# Patient Record
Sex: Male | Born: 1960
Health system: Southern US, Community
[De-identification: ages and names within clinical notes are randomized; demographics above are authoritative.]

## PROBLEM LIST (undated history)

## (undated) DIAGNOSIS — F250 Schizoaffective disorder, bipolar type: Secondary | ICD-10-CM

## (undated) DIAGNOSIS — K219 Gastro-esophageal reflux disease without esophagitis: Secondary | ICD-10-CM

## (undated) DIAGNOSIS — E785 Hyperlipidemia, unspecified: Secondary | ICD-10-CM

## (undated) DIAGNOSIS — F259 Schizoaffective disorder, unspecified: Secondary | ICD-10-CM

## (undated) DIAGNOSIS — E559 Vitamin D deficiency, unspecified: Secondary | ICD-10-CM

## (undated) DIAGNOSIS — R809 Proteinuria, unspecified: Secondary | ICD-10-CM

## (undated) DIAGNOSIS — K5909 Other constipation: Secondary | ICD-10-CM

## (undated) DIAGNOSIS — N200 Calculus of kidney: Secondary | ICD-10-CM

## (undated) HISTORY — DX: Hyperlipidemia, unspecified: E78.5

## (undated) HISTORY — DX: Other constipation: K59.09

## (undated) HISTORY — DX: Vitamin D deficiency, unspecified: E55.9

## (undated) HISTORY — DX: Gastro-esophageal reflux disease without esophagitis: K21.9

## (undated) HISTORY — DX: Schizoaffective disorder, unspecified: F25.9

## (undated) HISTORY — DX: Proteinuria, unspecified: R80.9

## (undated) HISTORY — DX: Schizoaffective disorder, bipolar type: F25.0

## (undated) HISTORY — DX: Calculus of kidney: N20.0

---

## 2007-06-21 ENCOUNTER — Ambulatory Visit: Payer: Self-pay | Admitting: Specialist

## 2008-08-05 ENCOUNTER — Ambulatory Visit: Payer: Self-pay | Admitting: Family Medicine

## 2012-07-02 ENCOUNTER — Ambulatory Visit: Payer: Self-pay | Admitting: Gastroenterology

## 2012-07-02 HISTORY — PX: COLONOSCOPY: SHX174

## 2012-07-02 LAB — HM COLONOSCOPY: HM COLON: NORMAL

## 2013-06-26 LAB — LIPID PANEL
Cholesterol: 194 mg/dL (ref 0–200)
HDL: 46 mg/dL (ref 35–70)
LDL CALC: 110 mg/dL
Triglycerides: 192 mg/dL — AB (ref 40–160)

## 2013-06-26 LAB — PSA: PSA: 1.1

## 2014-01-15 DIAGNOSIS — Z79891 Long term (current) use of opiate analgesic: Secondary | ICD-10-CM | POA: Diagnosis not present

## 2014-01-29 DIAGNOSIS — Z79891 Long term (current) use of opiate analgesic: Secondary | ICD-10-CM | POA: Diagnosis not present

## 2014-01-29 DIAGNOSIS — F209 Schizophrenia, unspecified: Secondary | ICD-10-CM | POA: Diagnosis not present

## 2014-02-13 DIAGNOSIS — Z79891 Long term (current) use of opiate analgesic: Secondary | ICD-10-CM | POA: Diagnosis not present

## 2014-03-12 DIAGNOSIS — Z79891 Long term (current) use of opiate analgesic: Secondary | ICD-10-CM | POA: Diagnosis not present

## 2014-03-20 DIAGNOSIS — E785 Hyperlipidemia, unspecified: Secondary | ICD-10-CM | POA: Diagnosis not present

## 2014-03-20 DIAGNOSIS — K219 Gastro-esophageal reflux disease without esophagitis: Secondary | ICD-10-CM | POA: Diagnosis not present

## 2014-03-20 DIAGNOSIS — K5900 Constipation, unspecified: Secondary | ICD-10-CM | POA: Diagnosis not present

## 2014-03-20 DIAGNOSIS — F250 Schizoaffective disorder, bipolar type: Secondary | ICD-10-CM | POA: Diagnosis not present

## 2014-03-20 DIAGNOSIS — R809 Proteinuria, unspecified: Secondary | ICD-10-CM | POA: Diagnosis not present

## 2014-04-10 DIAGNOSIS — Z79891 Long term (current) use of opiate analgesic: Secondary | ICD-10-CM | POA: Diagnosis not present

## 2014-04-24 DIAGNOSIS — Z79891 Long term (current) use of opiate analgesic: Secondary | ICD-10-CM | POA: Diagnosis not present

## 2014-05-08 DIAGNOSIS — Z79891 Long term (current) use of opiate analgesic: Secondary | ICD-10-CM | POA: Diagnosis not present

## 2014-06-05 DIAGNOSIS — Z79891 Long term (current) use of opiate analgesic: Secondary | ICD-10-CM | POA: Diagnosis not present

## 2014-06-10 DIAGNOSIS — Z1389 Encounter for screening for other disorder: Secondary | ICD-10-CM | POA: Diagnosis not present

## 2014-06-10 DIAGNOSIS — R739 Hyperglycemia, unspecified: Secondary | ICD-10-CM | POA: Diagnosis not present

## 2014-06-10 DIAGNOSIS — Z0000 Encounter for general adult medical examination without abnormal findings: Secondary | ICD-10-CM | POA: Diagnosis not present

## 2014-06-10 DIAGNOSIS — R809 Proteinuria, unspecified: Secondary | ICD-10-CM | POA: Diagnosis not present

## 2014-06-10 DIAGNOSIS — E785 Hyperlipidemia, unspecified: Secondary | ICD-10-CM | POA: Diagnosis not present

## 2014-06-26 DIAGNOSIS — Z79891 Long term (current) use of opiate analgesic: Secondary | ICD-10-CM | POA: Diagnosis not present

## 2014-07-08 ENCOUNTER — Other Ambulatory Visit: Payer: Self-pay | Admitting: Family Medicine

## 2014-07-08 NOTE — Telephone Encounter (Signed)
Patient requesting refill. 

## 2014-07-10 DIAGNOSIS — E785 Hyperlipidemia, unspecified: Secondary | ICD-10-CM | POA: Diagnosis not present

## 2014-07-10 DIAGNOSIS — Z79899 Other long term (current) drug therapy: Secondary | ICD-10-CM | POA: Diagnosis not present

## 2014-07-10 DIAGNOSIS — Z79891 Long term (current) use of opiate analgesic: Secondary | ICD-10-CM | POA: Diagnosis not present

## 2014-07-10 DIAGNOSIS — R739 Hyperglycemia, unspecified: Secondary | ICD-10-CM | POA: Diagnosis not present

## 2014-07-10 DIAGNOSIS — Z125 Encounter for screening for malignant neoplasm of prostate: Secondary | ICD-10-CM | POA: Diagnosis not present

## 2014-07-22 ENCOUNTER — Encounter: Payer: Self-pay | Admitting: Family Medicine

## 2014-07-24 DIAGNOSIS — Z79891 Long term (current) use of opiate analgesic: Secondary | ICD-10-CM | POA: Diagnosis not present

## 2014-08-05 ENCOUNTER — Other Ambulatory Visit: Payer: Self-pay | Admitting: Family Medicine

## 2014-08-05 NOTE — Telephone Encounter (Signed)
Patient requesting refill. 

## 2014-08-14 DIAGNOSIS — R739 Hyperglycemia, unspecified: Secondary | ICD-10-CM | POA: Diagnosis not present

## 2014-08-14 DIAGNOSIS — Z125 Encounter for screening for malignant neoplasm of prostate: Secondary | ICD-10-CM | POA: Diagnosis not present

## 2014-08-14 DIAGNOSIS — Z79899 Other long term (current) drug therapy: Secondary | ICD-10-CM | POA: Diagnosis not present

## 2014-08-14 DIAGNOSIS — Z79891 Long term (current) use of opiate analgesic: Secondary | ICD-10-CM | POA: Diagnosis not present

## 2014-08-14 DIAGNOSIS — E785 Hyperlipidemia, unspecified: Secondary | ICD-10-CM | POA: Diagnosis not present

## 2014-08-25 ENCOUNTER — Encounter: Payer: Self-pay | Admitting: Family Medicine

## 2014-09-11 DIAGNOSIS — Z79891 Long term (current) use of opiate analgesic: Secondary | ICD-10-CM | POA: Diagnosis not present

## 2014-10-10 ENCOUNTER — Encounter: Payer: Self-pay | Admitting: Family Medicine

## 2014-10-10 ENCOUNTER — Ambulatory Visit (INDEPENDENT_AMBULATORY_CARE_PROVIDER_SITE_OTHER): Payer: Medicare Other | Admitting: Family Medicine

## 2014-10-10 VITALS — BP 116/72 | HR 94 | Temp 97.8°F | Resp 18 | Ht 77.0 in | Wt 176.3 lb

## 2014-10-10 DIAGNOSIS — Z79899 Other long term (current) drug therapy: Secondary | ICD-10-CM

## 2014-10-10 DIAGNOSIS — Z720 Tobacco use: Secondary | ICD-10-CM | POA: Insufficient documentation

## 2014-10-10 DIAGNOSIS — K5900 Constipation, unspecified: Secondary | ICD-10-CM | POA: Diagnosis not present

## 2014-10-10 DIAGNOSIS — F258 Other schizoaffective disorders: Secondary | ICD-10-CM | POA: Insufficient documentation

## 2014-10-10 DIAGNOSIS — R739 Hyperglycemia, unspecified: Secondary | ICD-10-CM | POA: Diagnosis not present

## 2014-10-10 DIAGNOSIS — K219 Gastro-esophageal reflux disease without esophagitis: Secondary | ICD-10-CM

## 2014-10-10 DIAGNOSIS — Z87442 Personal history of urinary calculi: Secondary | ICD-10-CM | POA: Insufficient documentation

## 2014-10-10 DIAGNOSIS — K644 Residual hemorrhoidal skin tags: Secondary | ICD-10-CM | POA: Insufficient documentation

## 2014-10-10 DIAGNOSIS — Z23 Encounter for immunization: Secondary | ICD-10-CM | POA: Diagnosis not present

## 2014-10-10 DIAGNOSIS — E785 Hyperlipidemia, unspecified: Secondary | ICD-10-CM | POA: Insufficient documentation

## 2014-10-10 DIAGNOSIS — K5909 Other constipation: Secondary | ICD-10-CM | POA: Insufficient documentation

## 2014-10-10 DIAGNOSIS — R809 Proteinuria, unspecified: Secondary | ICD-10-CM | POA: Insufficient documentation

## 2014-10-10 NOTE — Progress Notes (Signed)
Name: Christopher Valdez.   MRN: [id number]    DOB: June 27, 1960   Date:10/10/2014       Progress Note  Subjective  Chief Complaint  Chief Complaint  Patient presents with  . Medication Refill    follow-up  . Depression  . Hyperlipidemia  . Gastrophageal Reflux    HPI  Dyslipidemia: he takes Simvastatin daily and denies side effects.    GERD: he has been taking Omeprazole, denies heartburn or regurgitation.   Schizoaffective Disorder: he lives in a group home, has been stable on medications. Able to drive and work at [employer]   Constipation: he has a bowel movement almost every day, taking Fibercon with good control of symptoms. No abdominal pain, no recent pain during bowel movements.   Hyperglycemia: gained 11 lbs since last visit, not following a low sugar diet, denies polyphagia, polyuria or polydipsia.   Proteinuria: back to normal when checked in March 2016   Patient Active Problem List   Diagnosis Date Noted  . Other schizoaffective disorders 10/10/2014  . Proteinuria 10/10/2014  . Hyperglycemia 10/10/2014  . Dyslipidemia 10/10/2014  . History of kidney stones 10/10/2014  . External hemorrhoid 10/10/2014  . Chronic constipation 10/10/2014  . Tobacco use 10/10/2014  . GERD without esophagitis 10/10/2014    No past surgical history on file.  Family History  Problem Relation Age of Onset  . Parkinson's disease Mother   . Deep vein thrombosis Father   . Osteoarthritis Father     Social History   Social History  . Marital Status: Single    Spouse Name: N/A  . Number of Children: N/A  . Years of Education: N/A   Occupational History  . Not on file.   Social History Main Topics  . Smoking status: Current Every Day Smoker -- 20.00 packs/day for 40 years    Types: Cigarettes  . Smokeless tobacco: Never Used  . Alcohol Use: No  . Drug Use: No  . Sexual Activity: No   Other Topics Concern  . Not on file   Social History Narrative      Current outpatient prescriptions:  .  cholecalciferol (VITAMIN D) 1000 UNITS tablet, Take 1,000 Units by mouth 2 (two) times daily., Disp: , Rfl:  .  cloZAPine (CLOZARIL) 100 MG tablet, Take 100 mg by mouth daily., Disp: , Rfl:  .  cloZAPine (CLOZARIL) 100 MG tablet, Take 100 mg by mouth daily., Disp: , Rfl:  .  divalproex (DEPAKOTE) 500 MG DR tablet, Take 500 mg by mouth 2 (two) times daily., Disp: , Rfl:  .  FLUoxetine (PROZAC) 20 MG capsule, Take 20 mg by mouth daily., Disp: , Rfl:  .  FLUoxetine (PROZAC) 40 MG capsule, Take 40 mg by mouth daily., Disp: , Rfl:  .  omeprazole (PRILOSEC) 40 MG capsule, TAKE ONE CAPSULE BY MOUTH EVERY MORNING FOR REFLUX, Disp: 30 capsule, Rfl: 5 .  polycarbophil (FIBERCON) 625 MG tablet, Take 625 mg by mouth daily., Disp: , Rfl:  .  simvastatin (ZOCOR) 10 MG tablet, TAKE ONE TABLET EVERY EVENING FOR CHOLESTEROL, Disp: 30 tablet, Rfl: 12  No Known Allergies   ROS  Constitutional: Negative for fever or weight change.  Respiratory: Negative for cough and shortness of breath.   Cardiovascular: Negative for chest pain or palpitations.  Gastrointestinal: Negative for abdominal pain, no bowel changes.  Musculoskeletal: Negative for gait problem or joint swelling.  Skin: Negative for rash.  Neurological: Negative for dizziness or headache.  No other specific complaints in a complete review of systems (except as listed in HPI above).  Objective  Filed Vitals:   10/10/14 1403  BP: 116/72  Pulse: 94  Temp: 97.8 F (36.6 C)  TempSrc: Oral  Resp: 18  Height:  (1.956 m)  Weight: 176 lb 4.8 oz (79.969 kg)  SpO2: 96%    Body mass index is 20.9 kg/(m^2).  Physical Exam  Constitutional: Patient appears well-developed and well-nourished. No distress.  HEENT: head atraumatic, normocephalic, pupils equal and reactive to light,  neck supple, throat within normal limits Cardiovascular: Normal rate, regular rhythm and normal heart sounds.  No  murmur heard. No BLE edema. Pulmonary/Chest: Effort normal and breath sounds normal. No respiratory distress. Abdominal: Soft.  There is no tenderness. Psychiatric: Patient has a normal mood and affect. behavior is normal. Judgment and thought content normal.  PHQ2/9: Depression screen PHQ 2/9 10/10/2014  Decreased Interest 0  Down, Depressed, Hopeless 1  PHQ - 2 Score 1     Fall Risk: Fall Risk  10/10/2014  Falls in the past year? No     Functional Status Survey: Is the patient deaf or have difficulty hearing?: No Does the patient have difficulty seeing, even when wearing glasses/contacts?: Yes (glasses) Does the patient have difficulty concentrating, remembering, or making decisions?: Yes Does the patient have difficulty walking or climbing stairs?: No Does the patient have difficulty dressing or bathing?: No Does the patient have difficulty doing errands alone such as visiting a doctor's office or shopping?: Yes (does not drive)    Assessment & Plan  1. Dyslipidemia  - Lipid panel  2. Needs flu shot  - Flu Vaccine QUAD 36+ mos PF IM (Fluarix & Fluzone Quad PF)  3. Other schizoaffective disorders  Continue follow up with Psychiatrist  4. Hyperglycemia  - Hemoglobin A1c  5. Chronic constipation  Doing well on medication   6. Tobacco use   not ready to quit  7. GERD without esophagitis  Doing well on Omeprazole  8. Long-term use of high-risk medication  - Comprehensive metabolic panel

## 2014-10-14 DIAGNOSIS — Z79891 Long term (current) use of opiate analgesic: Secondary | ICD-10-CM | POA: Diagnosis not present

## 2014-10-28 DIAGNOSIS — Z79899 Other long term (current) drug therapy: Secondary | ICD-10-CM | POA: Diagnosis not present

## 2014-10-28 DIAGNOSIS — R739 Hyperglycemia, unspecified: Secondary | ICD-10-CM | POA: Diagnosis not present

## 2014-10-28 DIAGNOSIS — E785 Hyperlipidemia, unspecified: Secondary | ICD-10-CM | POA: Diagnosis not present

## 2014-10-28 DIAGNOSIS — H25813 Combined forms of age-related cataract, bilateral: Secondary | ICD-10-CM | POA: Diagnosis not present

## 2014-10-28 DIAGNOSIS — Z79891 Long term (current) use of opiate analgesic: Secondary | ICD-10-CM | POA: Diagnosis not present

## 2014-10-29 ENCOUNTER — Encounter: Payer: Self-pay | Admitting: Family Medicine

## 2014-10-29 DIAGNOSIS — E559 Vitamin D deficiency, unspecified: Secondary | ICD-10-CM | POA: Insufficient documentation

## 2014-10-29 DIAGNOSIS — N400 Enlarged prostate without lower urinary tract symptoms: Secondary | ICD-10-CM | POA: Insufficient documentation

## 2014-10-29 LAB — COMPREHENSIVE METABOLIC PANEL
ALBUMIN: 4.3 g/dL (ref 3.5–5.5)
ALT: 20 IU/L (ref 0–44)
AST: 21 IU/L (ref 0–40)
Albumin/Globulin Ratio: 1.9 (ref 1.1–2.5)
Alkaline Phosphatase: 110 IU/L (ref 39–117)
BUN/Creatinine Ratio: 21 — ABNORMAL HIGH (ref 9–20)
BUN: 18 mg/dL (ref 6–24)
Bilirubin Total: 0.3 mg/dL (ref 0.0–1.2)
CALCIUM: 9.9 mg/dL (ref 8.7–10.2)
CO2: 25 mmol/L (ref 18–29)
CREATININE: 0.87 mg/dL (ref 0.76–1.27)
Chloride: 100 mmol/L (ref 97–106)
GFR calc Af Amer: 112 mL/min/{1.73_m2} (ref >59)
GFR, EST NON AFRICAN AMERICAN: 97 mL/min/{1.73_m2} (ref >59)
GLOBULIN, TOTAL: 2.3 g/dL (ref 1.5–4.5)
Glucose: 104 mg/dL — ABNORMAL HIGH (ref 65–99)
Potassium: 4.6 mmol/L (ref 3.5–5.2)
SODIUM: 141 mmol/L (ref 136–144)
Total Protein: 6.6 g/dL (ref 6.0–8.5)

## 2014-10-29 LAB — LIPID PANEL
CHOLESTEROL TOTAL: 193 mg/dL (ref 100–199)
Chol/HDL Ratio: 4.4 ratio units (ref 0.0–5.0)
HDL: 44 mg/dL (ref >39)
LDL Calculated: 120 mg/dL — ABNORMAL HIGH (ref 0–99)
Triglycerides: 145 mg/dL (ref 0–149)
VLDL Cholesterol Cal: 29 mg/dL (ref 5–40)

## 2014-10-29 LAB — HEMOGLOBIN A1C
ESTIMATED AVERAGE GLUCOSE: 111 mg/dL
Hgb A1c MFr Bld: 5.5 % (ref 4.8–5.6)

## 2014-11-10 ENCOUNTER — Other Ambulatory Visit: Payer: Self-pay

## 2014-11-10 MED ORDER — VITAMIN D 50 MCG (2000 UT) PO CAPS: 2000.0000 | ORAL_CAPSULE | Freq: Once | ORAL | Status: DC

## 2014-11-11 ENCOUNTER — Other Ambulatory Visit: Payer: Self-pay

## 2014-11-11 MED ORDER — LISINOPRIL 10 MG PO TABS: 10.0000 mg | ORAL_TABLET | Freq: Every day | ORAL | Status: DC

## 2014-11-11 NOTE — Telephone Encounter (Signed)
Patient needs refill

## 2014-11-12 DIAGNOSIS — Z79891 Long term (current) use of opiate analgesic: Secondary | ICD-10-CM | POA: Diagnosis not present

## 2014-11-26 DIAGNOSIS — Z79891 Long term (current) use of opiate analgesic: Secondary | ICD-10-CM | POA: Diagnosis not present

## 2014-12-10 DIAGNOSIS — Z79891 Long term (current) use of opiate analgesic: Secondary | ICD-10-CM | POA: Diagnosis not present

## 2014-12-23 DIAGNOSIS — Z79891 Long term (current) use of opiate analgesic: Secondary | ICD-10-CM | POA: Diagnosis not present

## 2015-01-22 DIAGNOSIS — Z79891 Long term (current) use of opiate analgesic: Secondary | ICD-10-CM | POA: Diagnosis not present

## 2015-02-04 DIAGNOSIS — Z79891 Long term (current) use of opiate analgesic: Secondary | ICD-10-CM | POA: Diagnosis not present

## 2015-02-09 ENCOUNTER — Other Ambulatory Visit: Payer: Self-pay

## 2015-02-09 MED ORDER — OMEPRAZOLE 40 MG PO CPDR: DELAYED_RELEASE_CAPSULE | ORAL | Status: DC

## 2015-02-09 NOTE — Telephone Encounter (Signed)
Patient requesting refill. 

## 2015-02-18 DIAGNOSIS — Z79891 Long term (current) use of opiate analgesic: Secondary | ICD-10-CM | POA: Diagnosis not present

## 2015-03-05 DIAGNOSIS — F209 Schizophrenia, unspecified: Secondary | ICD-10-CM | POA: Diagnosis not present

## 2015-04-13 DIAGNOSIS — Z79891 Long term (current) use of opiate analgesic: Secondary | ICD-10-CM | POA: Diagnosis not present

## 2015-04-14 ENCOUNTER — Ambulatory Visit (INDEPENDENT_AMBULATORY_CARE_PROVIDER_SITE_OTHER): Payer: Medicare Other | Admitting: Family Medicine

## 2015-04-14 ENCOUNTER — Encounter: Payer: Self-pay | Admitting: Family Medicine

## 2015-04-14 VITALS — BP 110/70 | HR 96 | Temp 98.4°F | Resp 16 | Ht 77.0 in | Wt 172.5 lb

## 2015-04-14 DIAGNOSIS — K219 Gastro-esophageal reflux disease without esophagitis: Secondary | ICD-10-CM

## 2015-04-14 DIAGNOSIS — E785 Hyperlipidemia, unspecified: Secondary | ICD-10-CM

## 2015-04-14 DIAGNOSIS — F258 Other schizoaffective disorders: Secondary | ICD-10-CM | POA: Diagnosis not present

## 2015-04-14 DIAGNOSIS — K5900 Constipation, unspecified: Secondary | ICD-10-CM

## 2015-04-14 DIAGNOSIS — K5909 Other constipation: Secondary | ICD-10-CM

## 2015-04-14 DIAGNOSIS — R809 Proteinuria, unspecified: Secondary | ICD-10-CM

## 2015-04-14 MED ORDER — LISINOPRIL 10 MG PO TABS: 10.0000 mg | ORAL_TABLET | Freq: Every day | ORAL | Status: DC

## 2015-04-14 NOTE — Progress Notes (Signed)
Name: Christopher Valdez.   MRN: [id number]    DOB: 08/28/1960   Date:04/14/2015       Progress Note  Subjective  Chief Complaint  Chief Complaint  Patient presents with  . Medication Refill  . Dyslipidemia  . Gastroesophageal Reflux    w/o esophagitis  . Other Schizoaffective Disorderd  . Constipation    chronic  . Long Term use of high-risk medication    HPI  Dyslipidemia: he takes Simvastatin daily and denies side effects. Last labs reviewed and at goal, continue medication   GERD: he has been taking Omeprazole, denies heartburn or regurgitation.   Schizoaffective Disorder: he lives in a group home, has been stable on medications. Able to drive and work at [employer] He sees Therapist, sports - goes to [place] - sees Dr. Hart Carwin, also sees a therapist.   Constipation: he has a bowel movement almost every day, taking Fibercon with good control of symptoms. No abdominal pain, no recent pain during bowel movements. No soiling.   Hyperglycemia: he has lost weight since last visit. , not following a low sugar diet, denies polyphagia, polyuria or polydipsia. Last hgbA1C 5.5%  Proteinuria: back to normal when checked in March 2016 we will recheck today. He states that when it is too hot he gets dizzy when he stoops over but able to control it by staying hydrated.    Patient Active Problem List   Diagnosis Date Noted  . Avitaminosis D 10/29/2014  . Enlarged prostate 10/29/2014  . Other schizoaffective disorders (HCC) 10/10/2014  . Proteinuria 10/10/2014  . Hyperglycemia 10/10/2014  . Dyslipidemia 10/10/2014  . History of kidney stones 10/10/2014  . External hemorrhoid 10/10/2014  . Chronic constipation 10/10/2014  . Tobacco use 10/10/2014  . GERD without esophagitis 10/10/2014    No past surgical history on file.  Family History  Problem Relation Age of Onset  . Parkinson's disease Mother   . Deep vein thrombosis Father   . Osteoarthritis Father     Social  History   Social History  . Marital Status: Single    Spouse Name: N/A  . Number of Children: N/A  . Years of Education: N/A   Occupational History  . Not on file.   Social History Main Topics  . Smoking status: Current Every Day Smoker -- 20.00 packs/day for 40 years    Types: Cigarettes  . Smokeless tobacco: Never Used  . Alcohol Use: No  . Drug Use: No  . Sexual Activity: No   Other Topics Concern  . Not on file   Social History Narrative     Current outpatient prescriptions:  .  Cholecalciferol (VITAMIN D) 2000 UNITS CAPS, Take 2,000 capsules (4,000,000 Units total) by mouth once., Disp: 30 capsule, Rfl: 12 .  cloZAPine (CLOZARIL) 100 MG tablet, Take 100 mg by mouth daily., Disp: , Rfl:  .  cloZAPine (CLOZARIL) 100 MG tablet, Take 100 mg by mouth daily., Disp: , Rfl:  .  divalproex (DEPAKOTE) 500 MG DR tablet, Take 500 mg by mouth 2 (two) times daily., Disp: , Rfl:  .  FLUoxetine (PROZAC) 20 MG capsule, Take 20 mg by mouth daily., Disp: , Rfl:  .  FLUoxetine (PROZAC) 40 MG capsule, Take 40 mg by mouth daily., Disp: , Rfl:  .  lisinopril (PRINIVIL,ZESTRIL) 10 MG tablet, Take 1 tablet (10 mg total) by mouth daily., Disp: 90 tablet, Rfl: 1 .  omeprazole (PRILOSEC) 40 MG capsule, TAKE ONE CAPSULE BY MOUTH EVERY MORNING  FOR REFLUX, Disp: 30 capsule, Rfl: 5 .  polycarbophil (FIBERCON) 625 MG tablet, Take 625 mg by mouth daily., Disp: , Rfl:  .  simvastatin (ZOCOR) 10 MG tablet, TAKE ONE TABLET EVERY EVENING FOR CHOLESTEROL, Disp: 30 tablet, Rfl: 12  No Known Allergies   ROS  Constitutional: Negative for fever , positive for mild  weight change.  Respiratory: Negative for cough and shortness of breath.   Cardiovascular: Negative for chest pain or palpitations.  Gastrointestinal: Negative for abdominal pain, no bowel changes.  Musculoskeletal: Negative for gait problem or joint swelling.  Skin: Negative for rash.  Neurological: Negative for dizziness ( not currently ) or  headache.  No other specific complaints in a complete review of systems (except as listed in HPI above).  Objective  Filed Vitals:   04/14/15 1514  BP: 110/70  Pulse: 96  Temp: 98.4 F (36.9 C)  TempSrc: Oral  Resp: 16  Height: 6\' 5"  (1.956 m)  Weight: 172 lb 8 oz (78.245 kg)  SpO2: 98%    Body mass index is 20.45 kg/(m^2).  Physical Exam  Constitutional: Patient appears well-developed and well-nourished.  No distress.  HEENT: head atraumatic, normocephalic, pupils equal and reactive to light, neck supple, throat within normal limits Cardiovascular: Normal rate, regular rhythm and normal heart sounds.  No murmur heard. No BLE edema. Pulmonary/Chest: Effort normal and breath sounds normal. No respiratory distress. Abdominal: Soft.  There is no tenderness. Psychiatric: Patient has a normal mood and affect. behavior is normal. Able to give appropriate information.    PHQ2/9: [facility] 04/14/2015 10/10/2014  Decreased Interest 0 0  Down, Depressed, Hopeless 0 1  PHQ - 2 Score 0 1     Fall Risk: Fall Risk  04/14/2015 10/10/2014  Falls in the past year? No No      Functional Status Survey: Is the patient deaf or have difficulty hearing?: No Does the patient have difficulty seeing, even when wearing glasses/contacts?: No Does the patient have difficulty concentrating, remembering, or making decisions?: No Does the patient have difficulty walking or climbing stairs?: No Does the patient have difficulty dressing or bathing?: No Does the patient have difficulty doing errands alone such as visiting a doctor's office or shopping?: No    Assessment & Plan   1. Proteinuria  - POCT UA - Microalbumin  2. Other schizoaffective disorders (HCC)  Continue follow up with psychiatrist and therapist  3. Dyslipidemia  Continue simvastatin, no side effects  4. GERD without esophagitis  Doing well on Omeprazole  5. Chronic constipation  Doing well at this  time

## 2015-05-13 DIAGNOSIS — Z79891 Long term (current) use of opiate analgesic: Secondary | ICD-10-CM | POA: Diagnosis not present

## 2015-05-13 DIAGNOSIS — F209 Schizophrenia, unspecified: Secondary | ICD-10-CM | POA: Diagnosis not present

## 2015-05-20 ENCOUNTER — Encounter: Payer: Self-pay | Admitting: Family Medicine

## 2015-05-20 ENCOUNTER — Ambulatory Visit (INDEPENDENT_AMBULATORY_CARE_PROVIDER_SITE_OTHER): Payer: Medicare Other | Admitting: Family Medicine

## 2015-05-20 VITALS — BP 102/58 | HR 95 | Temp 97.5°F | Resp 18 | Ht 77.0 in | Wt 163.9 lb

## 2015-05-20 DIAGNOSIS — R197 Diarrhea, unspecified: Secondary | ICD-10-CM | POA: Diagnosis not present

## 2015-05-20 DIAGNOSIS — F19939 Other psychoactive substance use, unspecified with withdrawal, unspecified: Secondary | ICD-10-CM | POA: Diagnosis not present

## 2015-05-20 DIAGNOSIS — R634 Abnormal weight loss: Secondary | ICD-10-CM

## 2015-05-20 NOTE — Progress Notes (Signed)
Name: Christopher Valdez.   MRN: [id number]    DOB: 09-04-1960   Date:05/20/2015       Progress Note  Subjective  Chief Complaint  Chief Complaint  Patient presents with  . Dizziness    patient is concerned about his BP being too high.  . Weight Loss    patient stated that he has not had an appetite  . Diarrhea    excessive. patient stated that he had an accident last night in his pants  . Medication Refill    patient is not sure if he needs a rx or not    HPI  WD of Clozaril: he has been taking Clozaril for many years, and for some reason he ran out of prescription last Tuesday and within 24 hours he started to lose appetite , some nausea, followed by diarrhea and worsening of lack of appetite. He denies fever, abdominal pain or blood in stools. He resumed medication Friday night. His symptoms have gradually improved since. Last night ate dinner without any problems, no longer has nausea, appetite is good. No diarrhea. Dizziness initially but feels much better today. While without the medication he was feeling a little paranoid without the medication   Patient Active Problem List   Diagnosis Date Noted  . Avitaminosis D 10/29/2014  . Enlarged prostate 10/29/2014  . Other schizoaffective disorders (HCC) 10/10/2014  . Proteinuria 10/10/2014  . Hyperglycemia 10/10/2014  . Dyslipidemia 10/10/2014  . History of kidney stones 10/10/2014  . External hemorrhoid 10/10/2014  . Chronic constipation 10/10/2014  . Tobacco use 10/10/2014  . GERD without esophagitis 10/10/2014    History reviewed. No pertinent past surgical history.  Family History  Problem Relation Age of Onset  . Parkinson's disease Mother   . Deep vein thrombosis Father   . Osteoarthritis Father     Social History   Social History  . Marital Status: Single    Spouse Name: N/A  . Number of Children: N/A  . Years of Education: N/A   Occupational History  . Not on file.   Social History Main Topics  . Smoking  status: Current Every Day Smoker -- 20.00 packs/day for 40 years    Types: Cigarettes  . Smokeless tobacco: Never Used  . Alcohol Use: No  . Drug Use: No  . Sexual Activity: No   Other Topics Concern  . Not on file   Social History Narrative     Current outpatient prescriptions:  .  Cholecalciferol (VITAMIN D) 2000 UNITS CAPS, Take 2,000 capsules (4,000,000 Units total) by mouth once., Disp: 30 capsule, Rfl: 12 .  cloZAPine (CLOZARIL) 100 MG tablet, Take 100 mg by mouth daily., Disp: , Rfl:  .  cloZAPine (CLOZARIL) 100 MG tablet, Take 100 mg by mouth daily., Disp: , Rfl:  .  divalproex (DEPAKOTE) 500 MG DR tablet, Take 500 mg by mouth 2 (two) times daily., Disp: , Rfl:  .  FLUoxetine (PROZAC) 20 MG capsule, Take 20 mg by mouth daily., Disp: , Rfl:  .  FLUoxetine (PROZAC) 40 MG capsule, Take 40 mg by mouth daily., Disp: , Rfl:  .  lisinopril (PRINIVIL,ZESTRIL) 10 MG tablet, Take 1 tablet (10 mg total) by mouth daily., Disp: 90 tablet, Rfl: 1 .  omeprazole (PRILOSEC) 40 MG capsule, TAKE ONE CAPSULE BY MOUTH EVERY MORNING FOR REFLUX, Disp: 30 capsule, Rfl: 5 .  polycarbophil (FIBERCON) 625 MG tablet, Take 625 mg by mouth daily., Disp: , Rfl:  .  simvastatin (ZOCOR) 10  MG tablet, TAKE ONE TABLET EVERY EVENING FOR CHOLESTEROL, Disp: 30 tablet, Rfl: 12  No Known Allergies   ROS  Ten systems reviewed and is negative except as mentioned in HPI   Objective  Filed Vitals:   05/20/15 1118  BP: 102/58  Pulse: 95  Temp: 97.5 F (36.4 C)  TempSrc: Oral  Resp: 18  Height: 6\' 5"  (1.956 m)  Weight: 163 lb 14.4 oz (74.345 kg)  SpO2: 96%    Body mass index is 19.43 kg/(m^2).  Physical Exam  Constitutional: Patient appears well-developed and well-nourished. No distress.  HEENT: head atraumatic, normocephalic, pupils equal and reactive to light, neck supple, throat within normal limits Cardiovascular: Normal rate, regular rhythm and normal heart sounds.  No murmur heard. No BLE  edema. Pulmonary/Chest: Effort normal and breath sounds normal. No respiratory distress. Abdominal: Soft.  There is no tenderness. Psychiatric: Patient has a normal mood and affect. behavior is normal. Judgment and thought content normal.   PHQ2/9: [facility] 05/20/2015 04/14/2015 10/10/2014  Decreased Interest 0 0 0  Down, Depressed, Hopeless 1 0 1  PHQ - 2 Score 1 0 1    Fall Risk: Fall Risk  05/20/2015 04/14/2015 10/10/2014  Falls in the past year? No No No    Functional Status Survey: Is the patient deaf or have difficulty hearing?: No Does the patient have difficulty seeing, even when wearing glasses/contacts?: No Does the patient have difficulty concentrating, remembering, or making decisions?: No Does the patient have difficulty walking or climbing stairs?: No Does the patient have difficulty dressing or bathing?: No Does the patient have difficulty doing errands alone such as visiting a doctor's office or shopping?: No    Assessment & Plan  1. Diarrhea, unspecified type  resolved  2. Weight loss  Appetite is back to normal, advised to monitor and if no weight gain return sooner for follow up  3. Withdrawal from other psychoactive substance (HCC)  All symptoms improved and mostly resolved since resumed Clozaril, explained importance of not running out of medication to Alphonsa OverallBevonda Ford - caregiver from group home to avoid recurrence of symptoms

## 2015-05-22 ENCOUNTER — Ambulatory Visit: Payer: Medicare Other | Admitting: Family Medicine

## 2015-05-27 DIAGNOSIS — F209 Schizophrenia, unspecified: Secondary | ICD-10-CM | POA: Diagnosis not present

## 2015-06-16 DIAGNOSIS — F209 Schizophrenia, unspecified: Secondary | ICD-10-CM | POA: Diagnosis not present

## 2015-07-13 DIAGNOSIS — F209 Schizophrenia, unspecified: Secondary | ICD-10-CM | POA: Diagnosis not present

## 2015-08-12 DIAGNOSIS — F209 Schizophrenia, unspecified: Secondary | ICD-10-CM | POA: Diagnosis not present

## 2015-09-07 ENCOUNTER — Other Ambulatory Visit: Payer: Self-pay

## 2015-09-07 MED ORDER — OMEPRAZOLE 40 MG PO CPDR: DELAYED_RELEASE_CAPSULE | ORAL | 5 refills | Status: DC

## 2015-09-07 NOTE — Telephone Encounter (Signed)
Patient requesting refill of O[facility]perazole [facility] into [facility].

## 2015-10-14 ENCOUNTER — Ambulatory Visit (INDEPENDENT_AMBULATORY_CARE_PROVIDER_SITE_OTHER): Payer: Medicare Other | Admitting: Family Medicine

## 2015-10-14 ENCOUNTER — Encounter: Payer: Self-pay | Admitting: Family Medicine

## 2015-10-14 ENCOUNTER — Ambulatory Visit: Payer: Medicare Other | Admitting: Family Medicine

## 2015-10-14 VITALS — BP 112/68 | HR 94 | Temp 97.8°F | Resp 16 | Ht 77.0 in | Wt 171.2 lb

## 2015-10-14 DIAGNOSIS — Z23 Encounter for immunization: Secondary | ICD-10-CM | POA: Diagnosis not present

## 2015-10-14 DIAGNOSIS — F258 Other schizoaffective disorders: Secondary | ICD-10-CM

## 2015-10-14 DIAGNOSIS — R059 Cough, unspecified: Secondary | ICD-10-CM

## 2015-10-14 DIAGNOSIS — R05 Cough: Secondary | ICD-10-CM

## 2015-10-14 DIAGNOSIS — K219 Gastro-esophageal reflux disease without esophagitis: Secondary | ICD-10-CM | POA: Diagnosis not present

## 2015-10-14 DIAGNOSIS — K5909 Other constipation: Secondary | ICD-10-CM

## 2015-10-14 DIAGNOSIS — R739 Hyperglycemia, unspecified: Secondary | ICD-10-CM | POA: Diagnosis not present

## 2015-10-14 DIAGNOSIS — Z720 Tobacco use: Secondary | ICD-10-CM | POA: Diagnosis not present

## 2015-10-14 DIAGNOSIS — Z79899 Other long term (current) drug therapy: Secondary | ICD-10-CM | POA: Diagnosis not present

## 2015-10-14 DIAGNOSIS — E785 Hyperlipidemia, unspecified: Secondary | ICD-10-CM | POA: Diagnosis not present

## 2015-10-14 DIAGNOSIS — R808 Other proteinuria: Secondary | ICD-10-CM

## 2015-10-14 NOTE — Progress Notes (Signed)
Name: Christopher Valdez.   MRN: [id number]    DOB: [birth year]   Date:10/14/2015       Progress Note  Subjective  Chief Complaint  Chief Complaint  Patient presents with  . Medication Refill    pt here for 6 month follow up    HPI  Dyslipidemia: he takes Simvastatin daily and denies side effects. He is due for labs  GERD: he has been taking Omeprazole, denies heartburn or regurgitation.   Schizoaffective Disorder: he lives in a group home, has been stable on medications. Able to drive and work at [employer], he is upset that he has not been called in to work over the past few weeks. He sees Therapist, sports - goes to [place] - sees Dr. Hart Carwin, also sees a therapist.   Constipation: he has a bowel movement almost every day, taking Fibercon with good control of symptoms. No abdominal pain, no recent pain during bowel movements. No soiling.   Hyperglycemia: he has gained weight, not following a low sugar diet, denies polyphagia, polyuria or polydipsia. Last hgbA1C 5.5%  Proteinuria: back to normal when checked in March 2016 we will recheck today. He states that when it is too hot he gets dizzy and has been off lisinopril  Cough: she states that he has a morning cough, but not daily , no SOB, he states cough is dry.    Patient Active Problem List   Diagnosis Date Noted  . Avitaminosis D 10/29/2014  . Enlarged prostate 10/29/2014  . Other schizoaffective disorders (HCC) 10/10/2014  . Proteinuria 10/10/2014  . Hyperglycemia 10/10/2014  . Dyslipidemia 10/10/2014  . History of kidney stones 10/10/2014  . External hemorrhoid 10/10/2014  . Chronic constipation 10/10/2014  . Tobacco use 10/10/2014  . GERD without esophagitis 10/10/2014    History reviewed. No pertinent surgical history.  Family History  Problem Relation Age of Onset  . Parkinson's disease Mother   . Deep vein thrombosis Father   . Osteoarthritis Father     Social History   Social History  .  Marital status: Single    Spouse name: N/A  . Number of children: N/A  . Years of education: N/A   Occupational History  . Not on file.   Social History Main Topics  . Smoking status: Current Every Day Smoker    Packs/day: 20.00    Years: 40.00    Types: Cigarettes  . Smokeless tobacco: Never Used  . Alcohol use No  . Drug use: No  . Sexual activity: No   Other Topics Concern  . Not on file   Social History Narrative  . No narrative on file     Current Outpatient Prescriptions:  .  Cholecalciferol (VITAMIN D) 2000 UNITS CAPS, Take 2,000 capsules (4,000,000 Units total) by mouth once., Disp: 30 capsule, Rfl: 12 .  cloZAPine (CLOZARIL) 100 MG tablet, Take 100 mg by mouth daily., Disp: , Rfl:  .  cloZAPine (CLOZARIL) 100 MG tablet, Take 100 mg by mouth daily., Disp: , Rfl:  .  divalproex (DEPAKOTE) 500 MG DR tablet, Take 500 mg by mouth 2 (two) times daily., Disp: , Rfl:  .  FLUoxetine (PROZAC) 20 MG capsule, Take 20 mg by mouth daily., Disp: , Rfl:  .  FLUoxetine (PROZAC) 40 MG capsule, Take 40 mg by mouth daily., Disp: , Rfl:  .  omeprazole (PRILOSEC) 40 MG capsule, TAKE ONE CAPSULE BY MOUTH EVERY MORNING FOR REFLUX, Disp: 30 capsule, Rfl: 5 .  polycarbophil (  FIBERCON) 625 MG tablet, Take 625 mg by mouth daily., Disp: , Rfl:  .  simvastatin (ZOCOR) 10 MG tablet, TAKE ONE TABLET EVERY EVENING FOR CHOLESTEROL, Disp: 30 tablet, Rfl: 12  No Known Allergies   ROS  Constitutional: Negative for fever, positive for  weight change.  Respiratory: Positive for occasional cough in am's, but denies shortness of breath.   Cardiovascular: Negative for chest pain or palpitations.  Gastrointestinal: Negative for abdominal pain, no bowel changes.  Musculoskeletal: Negative for gait problem or joint swelling.  Skin: Negative for rash.  Neurological: Negative for dizziness or headache.  No other specific complaints in a complete review of systems (except as listed in HPI  above).  Objective  Vitals:   10/14/15 1025  BP: 112/68  Pulse: 94  Resp: 16  Temp: 97.8 F (36.6 C)  SpO2: 96%  Weight: 171 lb 3 oz (77.7 kg)  Height: 6\' 5"  (1.956 m)    Body mass index is 20.3 kg/m.  Physical Exam  Constitutional: Patient appears well-developed and well-nourished.  No distress.  HEENT: head atraumatic, normocephalic, pupils equal and reactive to light, neck supple, throat within normal limits Cardiovascular: Normal rate, regular rhythm and normal heart sounds.  No murmur heard. No BLE edema. Pulmonary/Chest: Effort normal and breath sounds normal. No respiratory distress. Abdominal: Soft.  There is no tenderness. Psychiatric: Patient has a normal mood and affect. behavior is normal. Able to give appropriate information.    PHQ2/9: [facility] screen [facility] 10/14/2015 05/20/2015 04/14/2015 10/10/2014  Decreased Interest 0 0 0 0  Down, Depressed, Hopeless 0 1 0 1  PHQ - 2 Score 0 1 0 1     Fall Risk: Fall Risk  10/14/2015 05/20/2015 04/14/2015 10/10/2014  Falls in the past year? No No No No     Functional Status Survey: Is the patient deaf or have difficulty hearing?: No Does the patient have difficulty seeing, even when wearing glasses/contacts?: No Does the patient have difficulty concentrating, remembering, or making decisions?: No Does the patient have difficulty walking or climbing stairs?: No Does the patient have difficulty dressing or bathing?: No Does the patient have difficulty doing errands alone such as visiting a doctor's office or shopping?: Yes    Assessment & Plan  1. Dyslipidemia  - Lipid panel  2. Other proteinuria  We need to check next visit, out of strips  3. GERD without esophagitis  Doing well at this time  4. Chronic constipation  controlled  5. Hyperglycemia  Recheck hgbA1C yearly   6. Other schizoaffective disorders (HCC)  Keep follow up with psychiatrist  7. Needs flu shot  - Flu Vaccine QUAD 36+ mos  IM  8. Long-term use of high-risk medication  - COMPLETE METABOLIC PANEL WITH GFR - CBC with Differential/Platelet  9. Tobacco use  Discussed importance of quitting smoking, check spirometry - normal   10. Cough  Spirometry

## 2015-10-20 ENCOUNTER — Other Ambulatory Visit: Payer: Self-pay | Admitting: Family Medicine

## 2015-10-20 DIAGNOSIS — H25813 Combined forms of age-related cataract, bilateral: Secondary | ICD-10-CM | POA: Diagnosis not present

## 2015-10-20 DIAGNOSIS — E785 Hyperlipidemia, unspecified: Secondary | ICD-10-CM | POA: Diagnosis not present

## 2015-10-21 LAB — CBC WITH DIFFERENTIAL/PLATELET
BASOS: 1 %
Basophils Absolute: 0 10*3/uL (ref 0.0–0.2)
EOS (ABSOLUTE): 0.2 10*3/uL (ref 0.0–0.4)
EOS: 3 %
HEMATOCRIT: 37.7 % (ref 37.5–51.0)
Hemoglobin: 13 g/dL (ref 12.6–17.7)
IMMATURE GRANS (ABS): 0 10*3/uL (ref 0.0–0.1)
IMMATURE GRANULOCYTES: 0 %
LYMPHS: 27 %
Lymphocytes Absolute: 1.7 10*3/uL (ref 0.7–3.1)
MCH: 31 pg (ref 26.6–33.0)
MCHC: 34.5 g/dL (ref 31.5–35.7)
MCV: 90 fL (ref 79–97)
Monocytes Absolute: 0.8 10*3/uL (ref 0.1–0.9)
Monocytes: 13 %
NEUTROS PCT: 56 %
Neutrophils Absolute: 3.5 10*3/uL (ref 1.4–7.0)
PLATELETS: 273 10*3/uL (ref 150–379)
RBC: 4.2 x10E6/uL (ref 4.14–5.80)
RDW: 13.7 % (ref 12.3–15.4)
WBC: 6.2 10*3/uL (ref 3.4–10.8)

## 2015-10-21 LAB — LIPID PANEL W/O CHOL/HDL RATIO
Cholesterol, Total: 178 mg/dL (ref 100–199)
HDL: 42 mg/dL (ref >39)
LDL CALC: 112 mg/dL — AB (ref 0–99)
Triglycerides: 120 mg/dL (ref 0–149)
VLDL CHOLESTEROL CAL: 24 mg/dL (ref 5–40)

## 2015-10-21 LAB — COMPREHENSIVE METABOLIC PANEL
A/G RATIO: 2.2 (ref 1.2–2.2)
ALT: 21 IU/L (ref 0–44)
AST: 24 IU/L (ref 0–40)
Albumin: 4.7 g/dL (ref 3.5–5.5)
Alkaline Phosphatase: 101 IU/L (ref 39–117)
BUN/Creatinine Ratio: 24 — ABNORMAL HIGH (ref 9–20)
BUN: 23 mg/dL (ref 6–24)
Bilirubin Total: <0.2 mg/dL (ref 0.0–1.2)
CALCIUM: 9.6 mg/dL (ref 8.7–10.2)
CO2: 24 mmol/L (ref 18–29)
CREATININE: 0.96 mg/dL (ref 0.76–1.27)
Chloride: 101 mmol/L (ref 96–106)
GFR, EST AFRICAN AMERICAN: 102 mL/min/{1.73_m2} (ref >59)
GFR, EST NON AFRICAN AMERICAN: 88 mL/min/{1.73_m2} (ref >59)
GLOBULIN, TOTAL: 2.1 g/dL (ref 1.5–4.5)
Glucose: 91 mg/dL (ref 65–99)
POTASSIUM: 4.8 mmol/L (ref 3.5–5.2)
Sodium: 138 mmol/L (ref 134–144)
TOTAL PROTEIN: 6.8 g/dL (ref 6.0–8.5)

## 2015-11-03 ENCOUNTER — Ambulatory Visit (INDEPENDENT_AMBULATORY_CARE_PROVIDER_SITE_OTHER): Payer: Medicare Other | Admitting: Family Medicine

## 2015-11-03 ENCOUNTER — Encounter: Payer: Self-pay | Admitting: Family Medicine

## 2015-11-03 VITALS — BP 116/70 | HR 94 | Temp 97.8°F | Resp 18 | Ht 77.0 in | Wt 172.9 lb

## 2015-11-03 DIAGNOSIS — Z0000 Encounter for general adult medical examination without abnormal findings: Secondary | ICD-10-CM

## 2015-11-03 NOTE — Progress Notes (Signed)
Name: Christopher Valdez.   MRN: [id number]    DOB: 09/09/1960   Date:11/03/2015       Progress Note  Subjective  Chief Complaint  Chief Complaint  Patient presents with  . Medicare Wellness    HPI  Functional ability/safety issues: Lives in a group home, but able to drive to and from doctors appointments and work and also visit his family  Hearing issues: Addressed  Activities of daily living: Discussed Home safety issues: No Issues  End Of Life Planning: Offered verbal information regarding advanced directives, healthcare power of attorney ( he is not sure, his parents are still living )  Preventative care, Health maintenance, Preventative health measures discussed.  Preventative screenings discussed today: lab work, colonoscopy, PSA ( discussed USPTF ).  Men age 70 to 61 years if ever smoked recommended to get a one time AAA ultrasound screening exam.  Low Dose CT Chest recommended if Age 66-80 years, 30 pack-year currently smoking OR have quit w/in 15years.   Lifestyle risk factor issued reviewed: Diet, exercise, weight management, advised patient smoking is not healthy, nutrition/diet.  Preventative health measures discussed (5-10 year plan).  Reviewed and recommended vaccinations: - Pneumovax  - Prevnar  - Annual Influenza - Zostavax - Tdap   Depression screening: Done Fall risk screening: Done Discuss ADLs/IADLs: Done  Current medical providers: See HPI  Other health risk factors identified this visit: No other issues Cognitive impairment issues: None identified  All above discussed with patient. Appropriate education, counseling and referral will be made based upon the above.   IPSS Questionnaire (AUA-7): Over the past month.   1)  How often have you had a sensation of not emptying your bladder completely after you finish urinating?  0 - Not at all  2)  How often have you had to urinate again less than two hours after you finished urinating? 0 - Not at all   3)  How often have you found you stopped and started again several times when you urinated?  2 - Less than half the time  4) How difficult have you found it to postpone urination?  0 - Not at all  5) How often have you had a weak urinary stream?  0 - Not at all  6) How often have you had to push or strain to begin urination?  0 - Not at all  7) How many times did you most typically get up to urinate from the time you went to bed until the time you got up in the morning?  2 - 2 times  Total score:  0-7 mildly symptomatic   8-19 moderately symptomatic   20-35 severely symptomatic    Patient Active Problem List   Diagnosis Date Noted  . Avitaminosis D 10/29/2014  . Enlarged prostate 10/29/2014  . Other schizoaffective disorders (HCC) 10/10/2014  . Proteinuria 10/10/2014  . Hyperglycemia 10/10/2014  . Dyslipidemia 10/10/2014  . History of kidney stones 10/10/2014  . External hemorrhoid 10/10/2014  . Chronic constipation 10/10/2014  . Tobacco use 10/10/2014  . GERD without esophagitis 10/10/2014    History reviewed. No pertinent surgical history.  Family History  Problem Relation Age of Onset  . Parkinson's disease Mother   . Deep vein thrombosis Father   . Osteoarthritis Father     Social History   Social History  . Marital status: Single    Spouse name: N/A  . Number of children: N/A  . Years of education: N/A   Occupational  History  . Not on file.   Social History Main Topics  . Smoking status: Current Every Day Smoker    Packs/day: 1.00    Years: 40.00    Types: Cigarettes  . Smokeless tobacco: Never Used  . Alcohol use No  . Drug use: No  . Sexual activity: No   Other Topics Concern  . Not on file   Social History Narrative  . No narrative on file     Current Outpatient Prescriptions:  .  bismuth subsalicylate (PEPTO BISMOL) 262 MG chewable tablet, Chew 524 mg by mouth as needed., Disp: , Rfl:  .  Cholecalciferol (VITAMIN D) 2000 UNITS CAPS, Take  2,000 capsules (4,000,000 Units total) by mouth once., Disp: 30 capsule, Rfl: 12 .  cloZAPine (CLOZARIL) 100 MG tablet, Take 100 mg by mouth daily., Disp: , Rfl:  .  divalproex (DEPAKOTE) 500 MG DR tablet, Take 500 mg by mouth 2 (two) times daily., Disp: , Rfl:  .  FLUoxetine (PROZAC) 20 MG capsule, Take 20 mg by mouth daily., Disp: , Rfl:  .  FLUoxetine (PROZAC) 40 MG capsule, Take 40 mg by mouth daily., Disp: , Rfl:  .  omeprazole (PRILOSEC) 40 MG capsule, TAKE ONE CAPSULE BY MOUTH EVERY MORNING FOR REFLUX, Disp: 30 capsule, Rfl: 5 .  polycarbophil (FIBERCON) 625 MG tablet, Take 625 mg by mouth daily., Disp: , Rfl:  .  simvastatin (ZOCOR) 10 MG tablet, TAKE ONE TABLET EVERY EVENING FOR CHOLESTEROL, Disp: 30 tablet, Rfl: 12  No Known Allergies   ROS  Constitutional: Negative for fever or weight change.  Respiratory: Negative for cough and shortness of breath.   Cardiovascular: Negative for chest pain or palpitations.  Gastrointestinal: Negative for abdominal pain, no bowel changes.  Musculoskeletal: Negative for gait problem or joint swelling.  Skin: Negative for rash.  Neurological: Negative for dizziness or headache.  No other specific complaints in a complete review of systems (except as listed in HPI above).  Objective  Vitals:   11/03/15 1121  BP: 116/70  Pulse: 94  Resp: 18  Temp: 97.8 F (36.6 C)  TempSrc: Oral  SpO2: 96%  Weight: 172 lb 14.4 oz (78.4 kg)  Height: 6\' 5"  (1.956 m)    Body mass index is 20.5 kg/m.  Physical Exam  Constitutional: Patient appears well-developed and well-nourished. No distress.  HENT: Head: Normocephalic and atraumatic. Ears: B TMs ok, no erythema or effusion; Nose: Nose normal. Mouth/Throat: Oropharynx is clear and moist. No oropharyngeal exudate.  Eyes: Conjunctivae and EOM are normal. Pupils are equal, round, and reactive to light. No scleral icterus.  Neck: Normal range of motion. Neck supple. No JVD present. No thyromegaly  present.  Cardiovascular: Normal rate, regular rhythm and normal heart sounds.  No murmur heard. No BLE edema. Pulmonary/Chest: Effort normal and breath sounds normal. No respiratory distress. Abdominal: Soft. Bowel sounds are normal, no distension. There is no tenderness. no masses MALE GENITALIA: Normal descended testes bilaterally, no masses palpated, no hernias, no lesions, no discharge RECTAL: Prostate normal size and consistency, no rectal masses or hemorrhoids Musculoskeletal: Normal range of motion, no joint effusions. No gross deformities Neurological: he is alert and oriented to person, place, and time. No cranial nerve deficit. Coordination, balance, strength, speech and gait are normal.  Skin: Skin is warm and dry. No rash noted. No erythema.  Psychiatric: Patient has a normal mood and affect. behavior is normal. Judgment and thought content normal.  Recent Results (from the past 2160 hour(s))  CBC with Differential/Platelet     Status: None   Collection Time: 10/20/15 10:42 AM  Result Value Ref Range   WBC 6.2 3.4 - 10.8 x10E3/uL   RBC 4.20 4.14 - 5.80 x10E6/uL   Hemoglobin 13.0 12.6 - 17.7 g/dL   Hematocrit 16.1 09.6 - 51.0 %   MCV 90 79 - 97 fL   MCH 31.0 26.6 - 33.0 pg   MCHC 34.5 31.5 - 35.7 g/dL   RDW 04.5 40.9 - 81.1 %   Platelets 273 150 - 379 x10E3/uL   Neutrophils 56 Not Estab. %   Lymphs 27 Not Estab. %   Monocytes 13 Not Estab. %   Eos 3 Not Estab. %   Basos 1 Not Estab. %   Neutrophils Absolute 3.5 1.4 - 7.0 x10E3/uL   Lymphocytes Absolute 1.7 0.7 - 3.1 x10E3/uL   Monocytes Absolute 0.8 0.1 - 0.9 x10E3/uL   EOS (ABSOLUTE) 0.2 0.0 - 0.4 x10E3/uL   Basophils Absolute 0.0 0.0 - 0.2 x10E3/uL   Immature Granulocytes 0 Not Estab. %   Immature Grans (Abs) 0.0 0.0 - 0.1 x10E3/uL  Comprehensive metabolic panel     Status: Abnormal   Collection Time: 10/20/15 10:42 AM  Result Value Ref Range   Glucose 91 65 - 99 mg/dL   BUN 23 6 - 24 mg/dL   Creatinine, Ser  9.14 0.76 - 1.27 mg/dL   GFR calc non Af Amer 88 >59 mL/min/1.73   GFR calc Af Amer 102 >59 mL/min/1.73   BUN/Creatinine Ratio 24 (H) 9 - 20   Sodium 138 134 - 144 mmol/L   Potassium 4.8 3.5 - 5.2 mmol/L   Chloride 101 96 - 106 mmol/L   CO2 24 18 - 29 mmol/L   Calcium 9.6 8.7 - 10.2 mg/dL   Total Protein 6.8 6.0 - 8.5 g/dL   Albumin 4.7 3.5 - 5.5 g/dL   Globulin, Total 2.1 1.5 - 4.5 g/dL   Albumin/Globulin Ratio 2.2 1.2 - 2.2   Bilirubin Total <0.2 0.0 - 1.2 mg/dL   Alkaline Phosphatase 101 39 - 117 IU/L   AST 24 0 - 40 IU/L   ALT 21 0 - 44 IU/L  Lipid Panel w/o Chol/HDL Ratio     Status: Abnormal   Collection Time: 10/20/15 10:42 AM  Result Value Ref Range   Cholesterol, Total 178 100 - 199 mg/dL   Triglycerides 782 0 - 149 mg/dL   HDL 42 >95 mg/dL   VLDL Cholesterol Cal 24 5 - 40 mg/dL   LDL Calculated 621 (H) 0 - 99 mg/dL      HYQ6/5: Depression screen [facility] 2/9 10/14/2015 05/20/2015 04/14/2015 10/10/2014  Decreased Interest 0 0 0 0  Down, Depressed, Hopeless 0 1 0 1  PHQ - 2 Score 0 1 0 1     Fall Risk: Fall Risk  10/14/2015 05/20/2015 04/14/2015 10/10/2014  Falls in the past year? No No No No     Assessment & Plan  1. Medicare annual wellness visit, subsequent  Discussed importance of 150 minutes of physical activity weekly, eat two servings of fish weekly, eat one serving of tree nuts ( cashews, pistachios, pecans, almonds.Marland Kitchen) every other day, eat 6 servings of fruit/vegetables daily and drink plenty of water and avoid sweet beverages.

## 2015-11-26 ENCOUNTER — Other Ambulatory Visit: Payer: Self-pay

## 2015-12-02 ENCOUNTER — Other Ambulatory Visit: Payer: Self-pay

## 2015-12-02 NOTE — Telephone Encounter (Signed)
Patient requesting refill of Lisinopril to [facility].

## 2015-12-03 MED ORDER — LISINOPRIL 10 MG PO TABS: 10.0000 mg | ORAL_TABLET | Freq: Every day | ORAL | 3 refills | Status: DC

## 2016-03-08 ENCOUNTER — Other Ambulatory Visit: Payer: Self-pay

## 2016-03-08 MED ORDER — OMEPRAZOLE 40 MG PO CPDR: DELAYED_RELEASE_CAPSULE | ORAL | 5 refills | Status: DC

## 2016-03-08 NOTE — Telephone Encounter (Signed)
Patient requesting refill of Omeprazole to [facility].  

## 2016-05-03 ENCOUNTER — Encounter: Payer: Self-pay | Admitting: Family Medicine

## 2016-05-03 ENCOUNTER — Ambulatory Visit (INDEPENDENT_AMBULATORY_CARE_PROVIDER_SITE_OTHER): Payer: Medicare Other | Admitting: Family Medicine

## 2016-05-03 VITALS — BP 110/68 | HR 93 | Temp 97.6°F | Resp 16 | Ht 77.0 in | Wt 164.3 lb

## 2016-05-03 DIAGNOSIS — E785 Hyperlipidemia, unspecified: Secondary | ICD-10-CM | POA: Diagnosis not present

## 2016-05-03 DIAGNOSIS — R808 Other proteinuria: Secondary | ICD-10-CM | POA: Diagnosis not present

## 2016-05-03 DIAGNOSIS — K219 Gastro-esophageal reflux disease without esophagitis: Secondary | ICD-10-CM | POA: Diagnosis not present

## 2016-05-03 DIAGNOSIS — F258 Other schizoaffective disorders: Secondary | ICD-10-CM

## 2016-05-03 DIAGNOSIS — R739 Hyperglycemia, unspecified: Secondary | ICD-10-CM | POA: Diagnosis not present

## 2016-05-03 DIAGNOSIS — K5909 Other constipation: Secondary | ICD-10-CM | POA: Diagnosis not present

## 2016-05-03 MED ORDER — SIMVASTATIN 10 MG PO TABS: 10.0000 mg | ORAL_TABLET | Freq: Every day | ORAL | 12 refills | Status: DC

## 2016-05-03 NOTE — Progress Notes (Signed)
Name: Christopher Valdez.   MRN: [id number]    DOB: Aug 14, 1960   Date:05/03/2016       Progress Note  Subjective  Chief Complaint  Chief Complaint  Patient presents with  . dyslipidemia    6 month follow up    HPI  Dyslipidemia: he takes Simvastatin daily and denies side effects. Last labs reviewed and LDL is at goal   GERD: he has been taking Omeprazole, denies heartburn, abdominal pain or regurgitation.   Schizoaffective Disorder: he lives in a group home, has been stable on medications. Able to drive and work at [employer] He sees Therapist, sports - goes to [place] - sees Dr. Hart Carwin, also sees a therapist. He denies side effects of medication  Constipation: he has a bowel movement daily and sometimes twice daily but denies loose, taking Fibercon with good control of symptoms. No abdominal pain, no recent pain during bowel movements. No soiling.   Hyperglycemia: he had gained weight before his last visit, but is down 9 lbs. He denies polyphagia, polyuria or polydipsia. Last hgbA1C 5.5% October 2017  Proteinuria: back to normal when checked in March 2016, on ACE. He states that when it is too hot he gets dizzy and had stopped Lisinopril but is back on medication now. We may stop medication if no protein in urine and bp remains towards low end of normal    Patient Active Problem List   Diagnosis Date Noted  . Avitaminosis D 10/29/2014  . Enlarged prostate 10/29/2014  . Other schizoaffective disorders (HCC) 10/10/2014  . Proteinuria 10/10/2014  . Hyperglycemia 10/10/2014  . Dyslipidemia 10/10/2014  . History of kidney stones 10/10/2014  . External hemorrhoid 10/10/2014  . Chronic constipation 10/10/2014  . Tobacco use 10/10/2014  . GERD without esophagitis 10/10/2014    History reviewed. No pertinent surgical history.  Family History  Problem Relation Age of Onset  . Parkinson's disease Mother   . Deep vein thrombosis Father   . Osteoarthritis Father      Social History   Social History  . Marital status: Single    Spouse name: N/A  . Number of children: N/A  . Years of education: N/A   Occupational History  . Not on file.   Social History Main Topics  . Smoking status: Current Every Day Smoker    Packs/day: 1.00    Years: 40.00    Types: Cigarettes  . Smokeless tobacco: Never Used  . Alcohol use No  . Drug use: No  . Sexual activity: No   Other Topics Concern  . Not on file   Social History Narrative  . No narrative on file     Current Outpatient Prescriptions:  .  bismuth subsalicylate (PEPTO BISMOL) 262 MG chewable tablet, Chew 524 mg by mouth as needed., Disp: , Rfl:  .  Cholecalciferol (VITAMIN D) 2000 UNITS CAPS, Take 2,000 capsules (4,000,000 Units total) by mouth once., Disp: 30 capsule, Rfl: 12 .  cloZAPine (CLOZARIL) 100 MG tablet, Take 100 mg by mouth daily., Disp: , Rfl:  .  divalproex (DEPAKOTE) 500 MG DR tablet, Take 500 mg by mouth 2 (two) times daily., Disp: , Rfl:  .  FLUoxetine (PROZAC) 40 MG capsule, Take 40 mg by mouth daily., Disp: , Rfl:  .  lisinopril (PRINIVIL,ZESTRIL) 10 MG tablet, Take 1 tablet (10 mg total) by mouth daily., Disp: 90 tablet, Rfl: 3 .  omeprazole (PRILOSEC) 40 MG capsule, TAKE ONE CAPSULE BY MOUTH EVERY MORNING FOR REFLUX,  Disp: 30 capsule, Rfl: 5 .  polycarbophil (FIBERCON) 625 MG tablet, Take 625 mg by mouth daily., Disp: , Rfl:  .  simvastatin (ZOCOR) 10 MG tablet, Take 1 tablet (10 mg total) by mouth daily at 6 PM., Disp: 30 tablet, Rfl: 12  No Known Allergies   ROS  Constitutional: Negative for fever or weight change.  Respiratory: Negative for cough and shortness of breath.   Cardiovascular: Negative for chest pain or palpitations.  Gastrointestinal: Negative for abdominal pain, no bowel changes.  Musculoskeletal: Negative for gait problem or joint swelling.  Skin: Negative for rash.  Neurological: Negative for dizziness or headache.  No other specific complaints  in a complete review of systems (except as listed in HPI above).  Objective  Vitals:   05/03/16 1405  BP: 110/68  Pulse: 93  Resp: 16  Temp: 97.6 F (36.4 C)  SpO2: 95%  Weight: 164 lb 5 oz (74.5 kg)  Height:  (1.956 m)    Body mass index is 19.48 kg/m.  Physical Exam  Constitutional: Patient appears well-developed and well-nourished. No distress.  HEENT: head atraumatic, normocephalic, pupils equal and reactive to light, neck supple, throat within normal limits Cardiovascular: Normal rate, regular rhythm and normal heart sounds.  No murmur heard. No BLE edema. Pulmonary/Chest: Effort normal and breath sounds normal. No respiratory distress. Abdominal: Soft.  There is no tenderness. Psychiatric: Patient has a normal mood. Cooperative, good eye contact.  PHQ2/9: Depression screen [facility] 2/9 10/14/2015 05/20/2015 04/14/2015 10/10/2014  Decreased Interest 0 0 0 0  Down, Depressed, Hopeless 0 1 0 1  PHQ - 2 Score 0 1 0 1    Fall Risk: Fall Risk  10/14/2015 05/20/2015 04/14/2015 10/10/2014  Falls in the past year? No No No No      Assessment & Plan  1. Dyslipidemia  - simvastatin (ZOCOR) 10 MG tablet; Take 1 tablet (10 mg total) by mouth daily at 6 PM.  Dispense: 30 tablet; Refill: 12  2. Other proteinuria  - Protein / creatinine index, urine  3. GERD without esophagitis  Doing well on medication   4. Chronic constipation  Taking medication and bowel movements are controlled  5. Hyperglycemia  Last hgbA1C at goal   6. Other schizoaffective disorders (HCC)  Continue follow up with psychiatrist and medications

## 2016-07-05 ENCOUNTER — Other Ambulatory Visit: Payer: Self-pay

## 2016-07-05 MED ORDER — VITAMIN D 50 MCG (2000 UT) PO CAPS: 1.0000 | ORAL_CAPSULE | Freq: Once | ORAL | 12 refills | Status: AC

## 2016-07-05 NOTE — Telephone Encounter (Signed)
Patient requesting refill of Vitamin D.  

## 2016-08-08 ENCOUNTER — Encounter: Payer: Self-pay | Admitting: Family Medicine

## 2016-09-06 ENCOUNTER — Other Ambulatory Visit: Payer: Self-pay | Admitting: Emergency Medicine

## 2016-09-06 MED ORDER — OMEPRAZOLE 40 MG PO CPDR: DELAYED_RELEASE_CAPSULE | ORAL | 5 refills | Status: DC

## 2016-09-06 NOTE — Telephone Encounter (Signed)
Patient requesting refill of Omeprazole to [facility].

## 2016-10-25 DIAGNOSIS — H25813 Combined forms of age-related cataract, bilateral: Secondary | ICD-10-CM | POA: Diagnosis not present

## 2016-11-02 ENCOUNTER — Encounter: Payer: Self-pay | Admitting: Family Medicine

## 2016-11-02 ENCOUNTER — Ambulatory Visit (INDEPENDENT_AMBULATORY_CARE_PROVIDER_SITE_OTHER): Payer: Medicare Other | Admitting: Family Medicine

## 2016-11-02 VITALS — BP 100/76 | HR 97 | Resp 14 | Ht 77.0 in | Wt 167.9 lb

## 2016-11-02 DIAGNOSIS — R808 Other proteinuria: Secondary | ICD-10-CM

## 2016-11-02 DIAGNOSIS — K5909 Other constipation: Secondary | ICD-10-CM | POA: Diagnosis not present

## 2016-11-02 DIAGNOSIS — K219 Gastro-esophageal reflux disease without esophagitis: Secondary | ICD-10-CM

## 2016-11-02 DIAGNOSIS — Z79899 Other long term (current) drug therapy: Secondary | ICD-10-CM

## 2016-11-02 DIAGNOSIS — F258 Other schizoaffective disorders: Secondary | ICD-10-CM | POA: Diagnosis not present

## 2016-11-02 DIAGNOSIS — Z0000 Encounter for general adult medical examination without abnormal findings: Secondary | ICD-10-CM | POA: Diagnosis not present

## 2016-11-02 DIAGNOSIS — D649 Anemia, unspecified: Secondary | ICD-10-CM | POA: Diagnosis not present

## 2016-11-02 DIAGNOSIS — R739 Hyperglycemia, unspecified: Secondary | ICD-10-CM

## 2016-11-02 DIAGNOSIS — Z23 Encounter for immunization: Secondary | ICD-10-CM | POA: Diagnosis not present

## 2016-11-02 DIAGNOSIS — E785 Hyperlipidemia, unspecified: Secondary | ICD-10-CM | POA: Diagnosis not present

## 2016-11-02 MED ORDER — LISINOPRIL 10 MG PO TABS: 10.0000 mg | ORAL_TABLET | Freq: Every day | ORAL | 3 refills | Status: DC

## 2016-11-02 NOTE — Patient Instructions (Signed)

## 2016-11-02 NOTE — Progress Notes (Signed)
Patient: Christopher Bauernfeind., Male    DOB: November 27, 1960, 56 y.o.   MRN: [id number]  Visit Date: 11/02/2016  Today's Provider: Ruel Favors, MD   Chief Complaint  Patient presents with  . Gastroesophageal Reflux  . dyslipidemia  . Medicare Wellness    Subjective:    HPI Christopher Welcher. is a 56 y.o. male who presents today for his Subsequent Annual Wellness Visit.  Patient/Caregiver input:    He lives at [facility], he drove by himself to the office visit today  Dyslipidemia: he takes Simvastatin daily and denies side effects. He is due for labs today  GERD: he has been taking Omeprazole, denies heartburn, abdominal pain or regurgitation, discussed long term use of PPI, he is not sure if he wants to stop medication. .   Schizoaffective Disorder: he lives in a group home, has been stable on medications. Able to drive and work at [employer] He sees Therapist, sports - goes to [place] - sees Dr. Cherylann Ratel , also sees a therapist. He denies side effects of medication. We will check labs today   Constipation: he has a bowel movement daily and sometimes twice daily but denies loose, taking Fibercon with good control of symptoms. No abdominal pain, no recent pain during bowel movements. He states had some lose stools , but was about one month ago, doing well since  Hyperglycemia:  He denies polyphagia, polyuria or polydipsia. Last hgbA1C 5.5% October 2017 , due for repeat labs  Proteinuria: back to normal when checked in March 2016, on ACE. He states that when it is too hot he gets dizzy and had stopped Lisinopril but is back on medication now. We may stop medication if no protein in urine and bp remains towards low end of normal . Symptoms of dizziness only present on hot summer months, explained importance of staying hydrated  Anemia: on his last labs , we will recheck it, he never had hemoccult stools done  Review of Systems  Constitutional: Negative for  fever or weight change.  Respiratory: Negative for cough and shortness of breath.   Cardiovascular: Negative for chest pain or palpitations.  Gastrointestinal: Negative for abdominal pain, no bowel changes.  Musculoskeletal: Negative for gait problem or joint swelling.  Skin: Negative for rash.  Neurological: Negative for dizziness or headache.  No other specific complaints in a complete review of systems (except as listed in HPI above).  IPSS Questionnaire (AUA-7): Over the past month.   1)  How often have you had a sensation of not emptying your bladder completely after you finish urinating?  0 - Not at all  2)  How often have you had to urinate again less than two hours after you finished urinating? 1 - Less than 1 time in 5  3)  How often have you found you stopped and started again several times when you urinated?  2 - Less than half the time  4) How difficult have you found it to postpone urination?  1 - Less than 1 time in 5  5) How often have you had a weak urinary stream?  0 - Not at all  6) How often have you had to push or strain to begin urination?  0 - Not at all  7) How many times did you most typically get up to urinate from the time you went to bed until the time you got up in the morning?  1 - 1 time  Total score:  0-7 mildly symptomatic   8-19 moderately symptomatic   20-35 severely symptomatic     Past Medical History:  Diagnosis Date  . Chronic constipation   . Dyslipidemia   . GERD (gastroesophageal reflux disease)   . Proteinuria   . Schizo-affective schizophrenia (HCC)   . Vitamin D deficiency     History reviewed. No pertinent surgical history.  Family History  Problem Relation Age of Onset  . Parkinson's disease Mother   . Deep vein thrombosis Father   . Osteoarthritis Father     Social History   Social History  . Marital status: Single    Spouse name: N/A  . Number of children: N/A  . Years of education: N/A   Occupational History  . Not  on file.   Social History Main Topics  . Smoking status: Current Every Day Smoker    Packs/day: 1.00    Years: 40.00    Types: Cigarettes  . Smokeless tobacco: Never Used  . Alcohol use No  . Drug use: No  . Sexual activity: No   Other Topics Concern  . Not on file   Social History Narrative  . No narrative on file    Outpatient Encounter Prescriptions as of 11/02/2016  Medication Sig  . bismuth subsalicylate (PEPTO BISMOL) 262 MG chewable tablet Chew 524 mg by mouth as needed.  . cloZAPine (CLOZARIL) 100 MG tablet Take 100 mg by mouth daily.  . divalproex (DEPAKOTE) 500 MG DR tablet Take 500 mg by mouth 2 (two) times daily.  Marland Kitchen. FLUoxetine (PROZAC) 40 MG capsule Take 40 mg by mouth daily.  Marland Kitchen. lisinopril (PRINIVIL,ZESTRIL) 10 MG tablet Take 1 tablet (10 mg total) by mouth daily.  Marland Kitchen. omeprazole (PRILOSEC) 40 MG capsule TAKE ONE CAPSULE BY MOUTH EVERY MORNING FOR REFLUX  . polycarbophil (FIBERCON) 625 MG tablet Take 625 mg by mouth daily.  . simvastatin (ZOCOR) 10 MG tablet Take 1 tablet (10 mg total) by mouth daily at 6 PM.  . [DISCONTINUED] lisinopril (PRINIVIL,ZESTRIL) 10 MG tablet Take 1 tablet (10 mg total) by mouth daily.   No facility-administered encounter medications on file as of 11/02/2016.     No Known Allergies  Care Team Updated in EHR: Yes  Last Vision Exam: Oct 2018  Wears corrective lenses: Yes - [facility] Last Dental Exam: unknown Last Hearing Exam: Wears Hearing Aids: No  Functional Ability / Safety Screening 1.  Was the timed Get Up and Go test shorter than 30 seconds?  yes 2.  Does the patient need help with the phone, transportation, shopping,      preparing meals, housework, laundry, medications, or managing money?  no, lives in group home and needs assistance  3.  Is the patient's home free of loose throw rugs in walkways, pet beds, electrical cords, etc?   yes      Grab bars in the bathroom? yes      Handrails on the stairs?   yes      Adequate  lighting?   yes 4.  Has the patient noticed any hearing difficulties?   no  Diet Recall and Exercise Regimen:    Advanced Care Planning: A voluntary discussion about advance care planning including the explanation and discussion of advance directives.  Discussed health care proxy and Living will, and the patient was able to identify a health care proxy as his parents.  Patient not sure have a living will at present time. If patient does have living  will, I have requested they bring this to the clinic to be scanned in to their chart. Does patient have a HCPOA?    unknown  If yes, name and contact information:  Does patient have a living will or MOST form?  unknown   Cancer Screenings:  Lung: Low Dose CT Chest recommended if Age 32-80 years, 30 pack-year currently smoking OR have quit w/in 15years. Patient does qualify. He is not interested in having CT lung  Colon: up to date  Additional Screenings:  Hepatitis C Screening: up to date Intimate Partner Violence: N/A  Objective:   Vitals: BP 100/76 (BP Location: Left Arm, Patient Position: Sitting, Cuff Size: Normal)   Pulse 97   Resp 14   Ht 6\' 5"  (1.956 m)   Wt 167 lb 14.4 oz (76.2 kg)   SpO2 98%   BMI 19.91 kg/m  Body mass index is 19.91 kg/m.  No exam data present  Physical Exam Constitutional: Patient appears well-developed and well-nourished. No distress.  HEENT: head atraumatic, normocephalic, pupils equal and reactive to light, e neck supple, throat within normal limits Cardiovascular: Normal rate, regular rhythm and normal heart sounds.  No murmur heard. No BLE edema. Pulmonary/Chest: Effort normal and breath sounds normal. No respiratory distress. Abdominal: Soft.  There is no tenderness. Psychiatric: Patient has a normal mood and affect. behavior is normal. Judgment and thought content normal.  Cognitive Testing - 6-CIT  Correct? Score   What year is it? yes 0 Yes = 0    No = 4  What month is it? yes 0 Yes = 0     No = 3  Remember:     [place], [address]     What time is it? yes 0 Yes = 0    No = 3  Count backwards from 20 to 1 yes 0 Correct = 0    1 error = 2   More than 1 error = 4  Say the months of the year in reverse. yes 0 Correct = 0    1 error = 2   More than 1 error = 4  What address did I ask you to remember? yes 1 Correct = 0  1 error = 2    2 error = 4    3 error = 6    4 error = 8    All wrong = 10       TOTAL SCORE  1/28   Interpretation:  Normal  Normal (0-7) Abnormal (8-28)   Fall Risk: Fall Risk  11/02/2016 10/14/2015 05/20/2015 04/14/2015 10/10/2014  Falls in the past year? No No No No No   Current Exercise Habits: The patient does not participate in regular exercise at present Exercise limited by: None identified   Depression Screen Depression screen [facility] 2/9 11/02/2016 10/14/2015 05/20/2015 04/14/2015 10/10/2014  Decreased Interest 0 0 0 0 0  Down, Depressed, Hopeless 0 0 1 0 1  PHQ - 2 Score 0 0 1 0 1    No results found for this or any previous visit (from the past 2160 hour(s)).  Assessment & Plan:    1. Medicare annual wellness visit, subsequent  Discussed importance of 150 minutes of physical activity weekly, eat two servings of fish weekly, eat one serving of tree nuts ( cashews, pistachios, pecans, almonds.Marland Kitchen) every other day, eat 6 servings of fruit/vegetables daily and drink plenty of water and avoid sweet beverages.  Discussed USPTF  2. Need  for immunization against influenza  - Flu Vaccine QUAD 36+ mos IM  3. Other schizoaffective disorders (HCC)  Sees Dr. Cherylann Ratel  4. Other proteinuria  - lisinopril (PRINIVIL,ZESTRIL) 10 MG tablet; Take 1 tablet (10 mg total) by mouth daily.  Dispense: 90 tablet; Refill: 3 - COMPLETE METABOLIC PANEL WITH GFR - Urine Microalbumin w/creat. ratio  5. GERD without esophagitis  stable  6. Chronic constipation  Stable  7. Hyperglycemia  - Hemoglobin A1c  8. Long-term use of high-risk medication  -  VITAMIN D 25 Hydroxy (Vit-D Deficiency, Fractures) - clozaril for Dr. Cherylann Ratel  9. Anemia, unspecified type  - CBC with Differential/Platelet - Iron, TIBC and Ferritin Panel  10. Dyslipidemia  - Lipid panel  - Discussed health benefits of physical activity, and encouraged him to engage in regular exercise appropriate for his age and condition.   Immunization History  Administered Date(s) Administered  . Influenza,inj,Quad PF,6+ Mos 10/10/2014, 10/14/2015, 11/02/2016  . Influenza-Unspecified 09/13/2012  . Pneumococcal Polysaccharide-23 12/06/2011  . Tdap 05/28/2009    Health Maintenance  Topic Date Due  . HIV Screening  02/15/2019 (Originally 04/18/1974)  . TETANUS/TDAP  05/29/2019  . COLONOSCOPY  07/03/2022  . INFLUENZA VACCINE  Completed  . Hepatitis C Screening  Completed    Meds ordered this encounter  Medications  . lisinopril (PRINIVIL,ZESTRIL) 10 MG tablet    Sig: Take 1 tablet (10 mg total) by mouth daily.    Dispense:  90 tablet    Refill:  3    Current Outpatient Prescriptions:  .  bismuth subsalicylate (PEPTO BISMOL) 262 MG chewable tablet, Chew 524 mg by mouth as needed., Disp: , Rfl:  .  cloZAPine (CLOZARIL) 100 MG tablet, Take 100 mg by mouth daily., Disp: , Rfl:  .  divalproex (DEPAKOTE) 500 MG DR tablet, Take 500 mg by mouth 2 (two) times daily., Disp: , Rfl:  .  FLUoxetine (PROZAC) 40 MG capsule, Take 40 mg by mouth daily., Disp: , Rfl:  .  lisinopril (PRINIVIL,ZESTRIL) 10 MG tablet, Take 1 tablet (10 mg total) by mouth daily., Disp: 90 tablet, Rfl: 3 .  omeprazole (PRILOSEC) 40 MG capsule, TAKE ONE CAPSULE BY MOUTH EVERY MORNING FOR REFLUX, Disp: 30 capsule, Rfl: 5 .  polycarbophil (FIBERCON) 625 MG tablet, Take 625 mg by mouth daily., Disp: , Rfl:  .  simvastatin (ZOCOR) 10 MG tablet, Take 1 tablet (10 mg total) by mouth daily at 6 PM., Disp: 30 tablet, Rfl: 12 Medications Discontinued During This Encounter  Medication Reason  . lisinopril  (PRINIVIL,ZESTRIL) 10 MG tablet Reorder    I have personally reviewed and addressed the Medicare Annual Wellness health risk assessment questionnaire and have noted the following in the patient's chart:  A.         Medical and social history & family history B.         Use of alcohol, tobacco, and illicit drugs  C.         Current medications and supplements D.         Functional and Cognitive ability and status E.         Nutritional status F.         Physical activity G.        Advance directives H.         List of other physicians I.          Hospitalizations, surgeries, and ER visits in previous 12 months J.  Vitals K.         Screenings such as hearing, vision, cognitive function, and depression L.         Referrals: none identified   In addition, I have reviewed and discussed with patient certain preventive protocols, quality metrics, and best practice recommendations. A written personalized care plan for preventive services as well as general preventive health recommendations were provided to patient.  See attached scanned questionnaire for additional information.

## 2016-11-03 LAB — COMPLETE METABOLIC PANEL WITH GFR
AG RATIO: 2 (calc) (ref 1.0–2.5)
ALKALINE PHOSPHATASE (APISO): 110 U/L (ref 40–115)
ALT: 14 U/L (ref 9–46)
AST: 22 U/L (ref 10–35)
Albumin: 4.5 g/dL (ref 3.6–5.1)
BILIRUBIN TOTAL: 0.4 mg/dL (ref 0.2–1.2)
BUN: 18 mg/dL (ref 7–25)
CHLORIDE: 101 mmol/L (ref 98–110)
CO2: 30 mmol/L (ref 20–32)
Calcium: 9.4 mg/dL (ref 8.6–10.3)
Creat: 0.99 mg/dL (ref 0.70–1.33)
GFR, Est African American: 98 mL/min/{1.73_m2} (ref >=60)
GFR, Est Non African American: 84 mL/min/{1.73_m2} (ref >=60)
Globulin: 2.3 g/dL (calc) (ref 1.9–3.7)
Glucose, Bld: 87 mg/dL (ref 65–99)
POTASSIUM: 5.2 mmol/L (ref 3.5–5.3)
SODIUM: 137 mmol/L (ref 135–146)
Total Protein: 6.8 g/dL (ref 6.1–8.1)

## 2016-11-03 LAB — CBC WITH DIFFERENTIAL/PLATELET
BASOS PCT: 0.8 %
Basophils Absolute: 50 cells/uL (ref 0–200)
EOS PCT: 2.6 %
Eosinophils Absolute: 161 cells/uL (ref 15–500)
HCT: 40.6 % (ref 38.5–50.0)
HEMOGLOBIN: 13.6 g/dL (ref 13.2–17.1)
Lymphs Abs: 1352 cells/uL (ref 850–3900)
MCH: 30.5 pg (ref 27.0–33.0)
MCHC: 33.5 g/dL (ref 32.0–36.0)
MCV: 91 fL (ref 80.0–100.0)
MONOS PCT: 9.5 %
MPV: 10.4 fL (ref 7.5–12.5)
NEUTROS ABS: 4049 {cells}/uL (ref 1500–7800)
Neutrophils Relative %: 65.3 %
PLATELETS: 272 10*3/uL (ref 140–400)
RBC: 4.46 10*6/uL (ref 4.20–5.80)
RDW: 13.3 % (ref 11.0–15.0)
TOTAL LYMPHOCYTE: 21.8 %
WBC mixed population: 589 cells/uL (ref 200–950)
WBC: 6.2 10*3/uL (ref 3.8–10.8)

## 2016-11-03 LAB — MICROALBUMIN / CREATININE URINE RATIO
CREATININE, URINE: 102 mg/dL (ref 20–320)
MICROALB UR: 3.4 mg/dL
MICROALB/CREAT RATIO: 33 ug/mg{creat} — AB (ref <30)

## 2016-11-03 LAB — LIPID PANEL
CHOL/HDL RATIO: 3.9 (calc) (ref <5.0)
CHOLESTEROL: 197 mg/dL (ref <200)
HDL: 51 mg/dL (ref >40)
LDL Cholesterol (Calc): 121 mg/dL (calc) — ABNORMAL HIGH
NON-HDL CHOLESTEROL (CALC): 146 mg/dL — AB (ref <130)
Triglycerides: 131 mg/dL (ref <150)

## 2016-11-03 LAB — IRON,TIBC AND FERRITIN PANEL
%SAT: 28 % (ref 15–60)
FERRITIN: 104 ng/mL (ref 20–380)
IRON: 103 ug/dL (ref 50–180)
TIBC: 363 ug/dL (ref 250–425)

## 2016-11-03 LAB — VITAMIN D 25 HYDROXY (VIT D DEFICIENCY, FRACTURES): Vit D, 25-Hydroxy: 46 ng/mL (ref 30–100)

## 2016-11-03 LAB — HEMOGLOBIN A1C
HEMOGLOBIN A1C: 5.3 %{Hb} (ref <5.7)
MEAN PLASMA GLUCOSE: 105 (calc)
eAG (mmol/L): 5.8 (calc)

## 2016-11-04 ENCOUNTER — Encounter: Payer: Medicare Other | Admitting: Family Medicine

## 2016-11-08 LAB — CLOZAPINE (CLOZARIL)
Clozapine Lvl: 403 mcg/L
NORCLOZAPINE: 143 ug/L (ref 25–400)

## 2017-02-01 ENCOUNTER — Telehealth: Payer: Self-pay

## 2017-02-01 NOTE — Telephone Encounter (Signed)
Spoke with pharmacist at [facility] and Depakote and Clozapine at both prescribed by Dr. Rogers Valdez, Christopher BouillonShamsher. The Depakote 500 ER is once daily at bedtime and the Clozapine 100 mg is prescribed 2 tablets in the morning and 3 1/2 at bed time. Will change FL2 form per Christopher Valdez and fax  [facility]

## 2017-02-01 NOTE — Telephone Encounter (Signed)
Copied from CRM [phone]. Topic: Quick Communication - See Telephone Encounter >> Feb 01, 2017  9:23 AM Elliot GaultBell, Talmage Teaster M wrote: CRM for notification. See Telephone encounter for:   02/01/17.  Caller name: Autumn MessingSharon Tapp  Relation to pt: [facility]  Call back number: [phone] phone # and fax #    Reason for call:  Coordinator states Group Home was audited and they noticed FL2 form for 11/02/16 date of service reflected divalproex (DEPAKOTE) 500 MG DR tablet  2x daily instead of 1x daily before bedtime. Requesting new Rx (hard copy) faxed to  [phone]  >> Feb 01, 2017  9:31 AM Elliot GaultBell, Kalea Perine M wrote: CRM for notification. See Telephone encounter for:   02/01/17.  Caller name: Autumn MessingSharon Tapp  Relation to pt: [facility]  Call back number: [phone] phone # and fax #    Reason for call:  Coordinator states Group Home was audited and they noticed FL2 form for 11/02/16 date of service reflected divalproex (DEPAKOTE) 500 MG DR tablet  2x daily instead of 1x daily before bedtime. Requesting new Rx (hard copy) faxed to  [phone]

## 2017-02-28 ENCOUNTER — Other Ambulatory Visit: Payer: Self-pay

## 2017-02-28 MED ORDER — OMEPRAZOLE 40 MG PO CPDR: DELAYED_RELEASE_CAPSULE | ORAL | 5 refills | Status: DC

## 2017-02-28 NOTE — Telephone Encounter (Signed)
Refill request for general medication. Omeprazole to [facility].   Last office visit: 11/02/2017   Follow up on 03/07/2017

## 2017-03-07 ENCOUNTER — Ambulatory Visit: Payer: Medicare Other | Admitting: Family Medicine

## 2017-04-04 ENCOUNTER — Encounter: Payer: Self-pay | Admitting: Family Medicine

## 2017-04-04 ENCOUNTER — Ambulatory Visit (INDEPENDENT_AMBULATORY_CARE_PROVIDER_SITE_OTHER): Payer: Medicare Other | Admitting: Family Medicine

## 2017-04-04 VITALS — BP 88/50 | HR 102 | Resp 16 | Ht 77.0 in | Wt 163.2 lb

## 2017-04-04 DIAGNOSIS — K219 Gastro-esophageal reflux disease without esophagitis: Secondary | ICD-10-CM

## 2017-04-04 DIAGNOSIS — F258 Other schizoaffective disorders: Secondary | ICD-10-CM | POA: Diagnosis not present

## 2017-04-04 DIAGNOSIS — R808 Other proteinuria: Secondary | ICD-10-CM

## 2017-04-04 DIAGNOSIS — E785 Hyperlipidemia, unspecified: Secondary | ICD-10-CM

## 2017-04-04 DIAGNOSIS — R739 Hyperglycemia, unspecified: Secondary | ICD-10-CM

## 2017-04-04 DIAGNOSIS — K5909 Other constipation: Secondary | ICD-10-CM | POA: Diagnosis not present

## 2017-04-04 MED ORDER — SIMVASTATIN 10 MG PO TABS: 10.0000 mg | ORAL_TABLET | Freq: Every day | ORAL | 12 refills | Status: DC

## 2017-04-04 NOTE — Progress Notes (Signed)
Name: Christopher AmourJohn J Hornik Jr.   MRN: [id number]    DOB: 05/18/1960   Date:04/04/2017       Progress Note  Subjective  Chief Complaint  Chief Complaint  Patient presents with  . Hyperglycemia  . Gastroesophageal Reflux  . Anemia    HPI   [facility] [address] [facility], he drove by himself to the office visit today, he also works at [employer]   Dyslipidemia: he takes Simvastatin daily and denies side effects. Reviewed labs with patient . No chest pain or decrease in exercise tolerance.   GERD: he has been taking Omeprazole, denies heartburn, abdominal painor regurgitation, discussed long term use of PPI, he is not sure if he wants to stop medication. Unchanged.    Schizoaffective Disorder: he lives in a group home, has been stable on medications. 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 He sees Therapist, sportssychiatrist - [facility] Dr. Mervyn SkeetersA.  , also sees a therapist. He denies side effects of medication. He is worried about losing his job, but states he spoke to caregivers about it, and will need help from vocational rehab for further training.   Constipation: he has a bowel movement daily and sometimes twice daily but denies loose, taking Fibercon with good control of symptoms. No abdominal pain, no recent pain during bowel movements.  Hyperglycemia:  Hedenies polyphagia, polyuria or polydipsia. Last hgbA1C 5.3% October 2018    Proteinuria: back to normal when checked in March 2016, on ACE, it had normalized but went to 33 back October 2018, he is still on lisinopril, occasionally he has mild dizziness.   Anemia: labs back to normal, no sob or pica.    Patient Active Problem List   Diagnosis Date Noted  . Avitaminosis D 10/29/2014  . Enlarged prostate 10/29/2014  . Other schizoaffective disorders (HCC) 10/10/2014  . Proteinuria 10/10/2014  . Hyperglycemia 10/10/2014  . Dyslipidemia 10/10/2014  . History of kidney stones 10/10/2014  . External hemorrhoid  10/10/2014  . Chronic constipation 10/10/2014  . Tobacco use 10/10/2014  . GERD without esophagitis 10/10/2014    History reviewed. No pertinent surgical history.  Family History  Problem Relation Age of Onset  . Parkinson's disease Mother   . Deep vein thrombosis Father   . Osteoarthritis Father     Social History   Socioeconomic History  . Marital status: Single    Spouse name: Not on file  . Number of children: 0  . Years of education: Not on file  . Highest education level: Not on file  Occupational History  . Not on file  Social Needs  . Financial resource strain: Not on file  . Food insecurity:    Worry: Not on file    Inability: Not on file  . Transportation needs:    Medical: Not on file    Non-medical: Not on file  Tobacco Use  . Smoking status: Current Every Day Smoker    Packs/day: 1.00    Years: 40.00    Pack years: 40.00    Types: Cigarettes  . Smokeless tobacco: Never Used  Substance and Sexual Activity  . Alcohol use: No    Alcohol/week: 0.0 oz  . Drug use: No  . Sexual activity: Never  Lifestyle  . Physical activity:    Days per week: Not on file    Minutes per session: Not on file  . Stress: Not on file  Relationships  . Social connections:  Talks on phone: Not on file    Gets together: Not on file    Attends religious service: Not on file    Active member of club or organization: Not on file    Attends meetings of clubs or organizations: Not on file    Relationship status: Not on file  . Intimate partner violence:    Fear of current or ex partner: Not on file    Emotionally abused: Not on file    Physically abused: Not on file    Forced sexual activity: Not on file  Other Topics Concern  . Not on file  Social History Narrative  . Not on file     Current Outpatient Medications:  .  bismuth subsalicylate (PEPTO BISMOL) 262 MG chewable tablet, Chew 524 mg by mouth as needed., Disp: , Rfl:  .  cloZAPine (CLOZARIL) 100 MG tablet,  Take 100 mg by mouth daily. 2 tablets in the morning and 3 and half at bedtime, Disp: , Rfl:  .  divalproex (DEPAKOTE) 500 MG DR tablet, Take 500 mg by mouth at bedtime. Extended Release , Disp: , Rfl:  .  FLUoxetine (PROZAC) 40 MG capsule, Take 40 mg by mouth daily., Disp: , Rfl:  .  lisinopril (PRINIVIL,ZESTRIL) 10 MG tablet, Take 1 tablet (10 mg total) by mouth daily., Disp: 90 tablet, Rfl: 3 .  omeprazole (PRILOSEC) 40 MG capsule, TAKE ONE CAPSULE BY MOUTH EVERY MORNING FOR REFLUX, Disp: 30 capsule, Rfl: 5 .  polycarbophil (FIBERCON) 625 MG tablet, Take 625 mg by mouth daily., Disp: , Rfl:  .  simvastatin (ZOCOR) 10 MG tablet, Take 1 tablet (10 mg total) by mouth daily at 6 PM., Disp: 30 tablet, Rfl: 12  No Known Allergies   ROS  Constitutional: Negative for fever or weight change.  Respiratory: Negative for cough and shortness of breath.   Cardiovascular: Negative for chest pain or palpitations.  Gastrointestinal: Negative for abdominal pain, no bowel changes.  Musculoskeletal: Negative for gait problem or joint swelling.  Skin: Negative for rash.  Neurological: Negative for dizziness or headache.  No other specific complaints in a complete review of systems (except as listed in HPI above).  Objective  Vitals:   04/04/17 1345  BP: (!) 88/50  Pulse: (!) 102  Resp: 16  SpO2: 98%  Weight: 163 lb 3.2 oz (74 kg)  Height: 6\' 5"  (1.956 m)    Body mass index is 19.35 kg/m.  Physical Exam  Constitutional: Patient appears well-developed and well-nourished. No distress.  HEENT: head atraumatic, normocephalic, pupils equal and reactive to light, e neck supple, throat within normal limits Cardiovascular: Normal rate, regular rhythm and normal heart sounds.  No murmur heard. No BLE edema. Pulmonary/Chest: Effort normal and breath sounds normal. No respiratory distress. Abdominal: Soft.  There is no tenderness. Psychiatric: Patient has a normal mood and affect. behavior is normal.  Cooperative    PHQ2/9: Depression screen [facility] 2/9 11/02/2016 10/14/2015 05/20/2015 04/14/2015 10/10/2014  Decreased Interest 0 0 0 0 0  Down, Depressed, Hopeless 0 0 1 0 1  PHQ - 2 Score 0 0 1 0 1     Fall Risk: Fall Risk  04/04/2017 11/02/2016 10/14/2015 05/20/2015 04/14/2015  Falls in the past year? No No No No No    Functional Status Survey: Is the patient deaf or have difficulty hearing?: No Does the patient have difficulty seeing, even when wearing glasses/contacts?: No Does the patient have difficulty concentrating, remembering, or making decisions?: No Does the  patient have difficulty walking or climbing stairs?: No Does the patient have difficulty dressing or bathing?: No Does the patient have difficulty doing errands alone such as visiting a doctor's office or shopping?: No    Assessment & Plan   1. Other schizoaffective disorders (HCC)  Stable , continue follow up with psychiatrist   2. Hyperglycemia  Doing well, hgbA1C is normal   3. Other proteinuria  Mild elevation, continue ace   4. Chronic constipation  Doing well on prn medication   5. Dyslipidemia  - simvastatin (ZOCOR) 10 MG tablet; Take 1 tablet (10 mg total) by mouth daily at 6 PM.  Dispense: 30 tablet; Refill: 12  6. GERD without esophagitis  Controlled

## 2017-07-14 ENCOUNTER — Other Ambulatory Visit: Payer: Self-pay

## 2017-07-14 NOTE — Telephone Encounter (Signed)
Refill request for general medication: Vitamin D-3 2000 IU  Last office visit: 04/04/2017  Last physical exam: 11/02/2016  Follow-ups on file. 10/04/2017

## 2017-07-14 NOTE — Telephone Encounter (Signed)
Vitamin D is not on patient's active medication list.  Last vitamin D level 11/02/2017 was WNL at 46.

## 2017-07-17 ENCOUNTER — Other Ambulatory Visit: Payer: Self-pay

## 2017-07-17 MED ORDER — VITAMIN D3 50 MCG (2000 UT) PO CAPS: 2000.0000 [IU] | ORAL_CAPSULE | Freq: Every day | ORAL | 5 refills | Status: DC

## 2017-07-17 NOTE — Telephone Encounter (Signed)
Refill request was sent to Dr. Krichna Sowles for approval and submission.  

## 2017-07-19 ENCOUNTER — Other Ambulatory Visit: Payer: Self-pay

## 2017-07-19 MED ORDER — OMEPRAZOLE 40 MG PO CPDR: DELAYED_RELEASE_CAPSULE | ORAL | 5 refills | Status: DC

## 2017-07-19 NOTE — Telephone Encounter (Signed)
Refill request for general medication. Omeprazole  Last office visit 04/04/17   Follow up on 10/04/17

## 2017-10-04 ENCOUNTER — Ambulatory Visit (INDEPENDENT_AMBULATORY_CARE_PROVIDER_SITE_OTHER): Payer: Medicare Other | Admitting: Family Medicine

## 2017-10-04 ENCOUNTER — Encounter: Payer: Self-pay | Admitting: Family Medicine

## 2017-10-04 VITALS — BP 100/66 | HR 106 | Temp 97.8°F | Resp 16 | Ht 77.0 in | Wt 166.4 lb

## 2017-10-04 DIAGNOSIS — Z720 Tobacco use: Secondary | ICD-10-CM

## 2017-10-04 DIAGNOSIS — Z862 Personal history of diseases of the blood and blood-forming organs and certain disorders involving the immune mechanism: Secondary | ICD-10-CM | POA: Diagnosis not present

## 2017-10-04 DIAGNOSIS — Z23 Encounter for immunization: Secondary | ICD-10-CM

## 2017-10-04 DIAGNOSIS — E785 Hyperlipidemia, unspecified: Secondary | ICD-10-CM | POA: Diagnosis not present

## 2017-10-04 DIAGNOSIS — K219 Gastro-esophageal reflux disease without esophagitis: Secondary | ICD-10-CM

## 2017-10-04 DIAGNOSIS — Z79899 Other long term (current) drug therapy: Secondary | ICD-10-CM

## 2017-10-04 DIAGNOSIS — R809 Proteinuria, unspecified: Secondary | ICD-10-CM

## 2017-10-04 DIAGNOSIS — R739 Hyperglycemia, unspecified: Secondary | ICD-10-CM

## 2017-10-04 DIAGNOSIS — F258 Other schizoaffective disorders: Secondary | ICD-10-CM

## 2017-10-04 DIAGNOSIS — D649 Anemia, unspecified: Secondary | ICD-10-CM | POA: Diagnosis not present

## 2017-10-04 MED ORDER — LISINOPRIL 10 MG PO TABS: 10.0000 mg | ORAL_TABLET | Freq: Every day | ORAL | 5 refills | Status: DC

## 2017-10-04 NOTE — Progress Notes (Signed)
Name: Christopher Valdez.   MRN: [id number]    DOB: 07-24-1960   Date:10/04/2017       Progress Note  Subjective  Chief Complaint  Chief Complaint  Patient presents with  . Medication Refill    He lives at [facility], he drove by himself to the office visit today, he also works at [employer]  . Schizophrenia  . Gastroesophageal Reflux  . Constipation  . Anemia    HPI  He lives at [facility], he drove by himself to the office visit today, no longer at [employer] , he is now working at [employer].   Dyslipidemia: he takes Simvastatin daily and denies side effects. Reviewed labs with patient . No chest pain or decrease in exercise tolerance. He is due for repeat labs today   GERD: he has been taking Omeprazole, denies heartburn, abdominal painor regurgitation, discussed long term use of PPI, he is not sure if he wants to stop medication. He states he is doing well today without any abdominal pain   Schizoaffective Disorder: he lives in a group home, has been stable on medications. Able to drive and work at [employer] He sees Therapist, sports - goes to [facility] - Dr. Mervyn Skeeters. , also sees a therapist. He denies side effects of medication. He is happy that he is now working at [employer] now.   Constipation: he has a bowel movement daily and sometimes twice daily but denies loose, taking Fibercon with good control of symptoms. No abdominal pain, no recent pain during bowel movements. Unchanged   Hyperglycemia: Hedenies polyphagia, polyuria or polydipsia. Last hgbA1C 5.3% October 2018. Recheck labs at this time  Proteinuria: back to normal when checked in March 2016, on ACE, it had normalized but went to 33 back October 2018, he is still on lisinopril, occasionally he has mild dizziness. BP is towards low end of normal today but denies dizziness at this time  Anemia: labs back to normal, no sob or pica. Recheck labs  Patient Active Problem List    Diagnosis Date Noted  . Avitaminosis D 10/29/2014  . Enlarged prostate 10/29/2014  . Other schizoaffective disorders (HCC) 10/10/2014  . Proteinuria 10/10/2014  . Hyperglycemia 10/10/2014  . Dyslipidemia 10/10/2014  . History of kidney stones 10/10/2014  . External hemorrhoid 10/10/2014  . Chronic constipation 10/10/2014  . Tobacco use 10/10/2014  . GERD without esophagitis 10/10/2014    History reviewed. No pertinent surgical history.  Family History  Problem Relation Age of Onset  . Parkinson's disease Mother   . Deep vein thrombosis Father   . Osteoarthritis Father     Social History   Socioeconomic History  . Marital status: Single    Spouse name: Not on file  . Number of children: 0  . Years of education: Not on file  . Highest education level: High school graduate  Occupational History  . Occupation: [employer]  Social Needs  . Financial resource strain: Not very hard  . Food insecurity:    Worry: Never true    Inability: Never true  . Transportation needs:    Medical: No    Non-medical: No  Tobacco Use  . Smoking status: Current Every Day Smoker    Packs/day: 1.00    Years: 40.00    Pack years: 40.00    Types: Cigarettes  . Smokeless tobacco: Never Used  Substance and Sexual Activity  . Alcohol use: No  Alcohol/week: 0.0 standard drinks  . Drug use: No  . Sexual activity: Never  Lifestyle  . Physical activity:    Days per week: 0 days    Minutes per session: 0 min  . Stress: Only a little  Relationships  . Social connections:    Talks on phone: More than three times a week    Gets together: Twice a week    Attends religious service: 1 to 4 times per year    Active member of club or organization: No    Attends meetings of clubs or organizations: Never    Relationship status: Never married  . Intimate partner violence:    Fear of current or ex partner: No    Emotionally abused: No    Physically abused: No    Forced sexual activity: No  Other  Topics Concern  . Not on file  Social History Narrative  . Not on file     Current Outpatient Medications:  .  bismuth subsalicylate (PEPTO BISMOL) 262 MG chewable tablet, Chew 524 mg by mouth as needed., Disp: , Rfl:  .  Cholecalciferol (VITAMIN D3) 2000 units capsule, Take 1 capsule (2,000 Units total) by mouth daily., Disp: 30 capsule, Rfl: 5 .  cloZAPine (CLOZARIL) 100 MG tablet, Take 100 mg by mouth daily. 2 tablets in the morning and 3 and half at bedtime, Disp: , Rfl:  .  divalproex (DEPAKOTE) 500 MG DR tablet, Take 500 mg by mouth at bedtime. Extended Release , Disp: , Rfl:  .  FLUoxetine (PROZAC) 40 MG capsule, Take 40 mg by mouth daily., Disp: , Rfl:  .  lisinopril (PRINIVIL,ZESTRIL) 10 MG tablet, Take 1 tablet (10 mg total) by mouth daily., Disp: 90 tablet, Rfl: 3 .  omeprazole (PRILOSEC) 40 MG capsule, TAKE ONE CAPSULE BY MOUTH EVERY MORNING FOR REFLUX, Disp: 30 capsule, Rfl: 5 .  polycarbophil (FIBERCON) 625 MG tablet, Take 625 mg by mouth daily., Disp: , Rfl:  .  simvastatin (ZOCOR) 10 MG tablet, Take 1 tablet (10 mg total) by mouth daily at 6 PM., Disp: 30 tablet, Rfl: 12  No Known Allergies  I personally reviewed active problem list, medication list, allergies, family history, social history with the patient/caregiver today.   ROS  Constitutional: Negative for fever or weight change.  Respiratory: positive for intermittent  cough and shortness of breath.   Cardiovascular: Negative for chest pain or palpitations.  Gastrointestinal: Negative for abdominal pain, no bowel changes.  Musculoskeletal: Negative for gait problem or joint swelling.  Skin: Negative for rash.  Neurological: Negative for dizziness or headache.  No other specific complaints in a complete review of systems (except as listed in HPI above).   Objective  Vitals:   10/04/17 1301  BP: 100/66  Pulse: (!) 106  Resp: 16  Temp: 97.8 F (36.6 C)  TempSrc: Oral  SpO2: 97%  Weight: 166 lb 6.4 oz  (75.5 kg)  Height: 6\' 5"  (1.956 m)    Body mass index is 19.73 kg/m.  Physical Exam  Constitutional: Patient appears well-developed and well-nourished. No distress.  HEENT: head atraumatic, normocephalic, pupils equal and reactive to light,neck supple, throat within normal limits Cardiovascular: Normal rate, regular rhythm and normal heart sounds.  No murmur heard. No BLE edema. Pulmonary/Chest: Effort normal and breath sounds normal. No respiratory distress. Abdominal: Soft.  There is no tenderness. Psychiatric: Patient has a normal mood and affect. behavior is normal. Judgment and thought content normal.  PHQ2/9: Depression screen [facility] 2/9 10/04/2017  11/02/2016 10/14/2015 05/20/2015 04/14/2015  Decreased Interest 0 0 0 0 0  Down, Depressed, Hopeless 0 0 0 1 0  PHQ - 2 Score 0 0 0 1 0  Altered sleeping 0 - - - -  Tired, decreased energy 0 - - - -  Change in appetite 0 - - - -  Feeling bad or failure about yourself  0 - - - -  Trouble concentrating 1 - - - -  Moving slowly or fidgety/restless 2 - - - -  Suicidal thoughts 0 - - - -  PHQ-9 Score 3 - - - -  Difficult doing work/chores Not difficult at all - - - -     Fall Risk: Fall Risk  10/04/2017 04/04/2017 11/02/2016 10/14/2015 05/20/2015  Falls in the past year? No No No No No     Assessment & Plan  1. Microalbuminuria   - lisinopril (PRINIVIL,ZESTRIL) 10 MG tablet; Take 1 tablet (10 mg total) by mouth daily.  Dispense: 30 tablet; Refill: 5  2. Needs flu shot  - Flu Vaccine QUAD 6+ mos PF IM (Fluarix Quad PF)  3. Dyslipidemia  - Lipid panel  4. Hyperglycemia  - Hemoglobin A1c  5. Other schizoaffective disorders (HCC)  Under the care of psychiatrist   6. GERD without esophagitis  stable  7. Tobacco use  Discussed importance of quititing   8. History of anemia  Recheck labs  - CBC with Differential/Platelet  9. Long-term use of high-risk medication  - COMPLETE METABOLIC PANEL WITH GFR

## 2017-10-05 ENCOUNTER — Other Ambulatory Visit: Payer: Self-pay | Admitting: Family Medicine

## 2017-10-05 DIAGNOSIS — D649 Anemia, unspecified: Secondary | ICD-10-CM

## 2017-10-06 ENCOUNTER — Other Ambulatory Visit: Payer: Self-pay

## 2017-10-06 DIAGNOSIS — D649 Anemia, unspecified: Secondary | ICD-10-CM

## 2017-10-06 LAB — COMPLETE METABOLIC PANEL WITH GFR
AG RATIO: 1.8 (calc) (ref 1.0–2.5)
ALKALINE PHOSPHATASE (APISO): 109 U/L (ref 40–115)
ALT: 15 U/L (ref 9–46)
AST: 20 U/L (ref 10–35)
Albumin: 4.1 g/dL (ref 3.6–5.1)
BILIRUBIN TOTAL: 0.3 mg/dL (ref 0.2–1.2)
BUN: 18 mg/dL (ref 7–25)
CHLORIDE: 102 mmol/L (ref 98–110)
CO2: 27 mmol/L (ref 20–32)
Calcium: 9.4 mg/dL (ref 8.6–10.3)
Creat: 1.07 mg/dL (ref 0.70–1.33)
GFR, Est African American: 88 mL/min/{1.73_m2} (ref >=60)
GFR, Est Non African American: 76 mL/min/{1.73_m2} (ref >=60)
GLOBULIN: 2.3 g/dL (ref 1.9–3.7)
Glucose, Bld: 111 mg/dL (ref 65–139)
POTASSIUM: 4.4 mmol/L (ref 3.5–5.3)
SODIUM: 137 mmol/L (ref 135–146)
Total Protein: 6.4 g/dL (ref 6.1–8.1)

## 2017-10-06 LAB — CBC WITH DIFFERENTIAL/PLATELET
BASOS PCT: 0.6 %
Basophils Absolute: 33 cells/uL (ref 0–200)
EOS ABS: 154 {cells}/uL (ref 15–500)
Eosinophils Relative: 2.8 %
HEMATOCRIT: 34.8 % — AB (ref 38.5–50.0)
HEMOGLOBIN: 11.7 g/dL — AB (ref 13.2–17.1)
Lymphs Abs: 1375 cells/uL (ref 850–3900)
MCH: 30.4 pg (ref 27.0–33.0)
MCHC: 33.6 g/dL (ref 32.0–36.0)
MCV: 90.4 fL (ref 80.0–100.0)
MPV: 10.6 fL (ref 7.5–12.5)
Monocytes Relative: 9.7 %
Neutro Abs: 3405 cells/uL (ref 1500–7800)
Neutrophils Relative %: 61.9 %
PLATELETS: 260 10*3/uL (ref 140–400)
RBC: 3.85 10*6/uL — ABNORMAL LOW (ref 4.20–5.80)
RDW: 13.1 % (ref 11.0–15.0)
TOTAL LYMPHOCYTE: 25 %
WBC: 5.5 10*3/uL (ref 3.8–10.8)
WBCMIX: 534 {cells}/uL (ref 200–950)

## 2017-10-06 LAB — IRON,TIBC AND FERRITIN PANEL
%SAT: 21 % (ref 20–48)
Ferritin: 133 ng/mL (ref 38–380)
Iron: 66 ug/dL (ref 50–180)
TIBC: 311 ug/dL (ref 250–425)

## 2017-10-06 LAB — MICROALBUMIN / CREATININE URINE RATIO
Creatinine, Urine: 172 mg/dL (ref 20–320)
MICROALB UR: 9.1 mg/dL
Microalb Creat Ratio: 53 mcg/mg creat — ABNORMAL HIGH (ref <30)

## 2017-10-06 LAB — HEMOGLOBIN A1C
EAG (MMOL/L): 6.5 (calc)
HEMOGLOBIN A1C: 5.7 %{Hb} — AB (ref <5.7)
Mean Plasma Glucose: 117 (calc)

## 2017-10-06 LAB — TEST AUTHORIZATION

## 2017-10-06 LAB — LIPID PANEL
CHOLESTEROL: 170 mg/dL (ref <200)
HDL: 45 mg/dL (ref >40)
LDL CHOLESTEROL (CALC): 104 mg/dL — AB
Non-HDL Cholesterol (Calc): 125 mg/dL (calc) (ref <130)
TRIGLYCERIDES: 114 mg/dL (ref <150)
Total CHOL/HDL Ratio: 3.8 (calc) (ref <5.0)

## 2017-10-12 ENCOUNTER — Ambulatory Visit (INDEPENDENT_AMBULATORY_CARE_PROVIDER_SITE_OTHER): Payer: Medicare Other | Admitting: Family Medicine

## 2017-10-12 ENCOUNTER — Encounter: Payer: Self-pay | Admitting: Family Medicine

## 2017-10-12 VITALS — BP 128/74 | HR 99 | Temp 97.8°F | Resp 16 | Ht 77.0 in | Wt 163.8 lb

## 2017-10-12 DIAGNOSIS — J069 Acute upper respiratory infection, unspecified: Secondary | ICD-10-CM | POA: Diagnosis not present

## 2017-10-12 DIAGNOSIS — D649 Anemia, unspecified: Secondary | ICD-10-CM | POA: Diagnosis not present

## 2017-10-12 NOTE — Progress Notes (Signed)
Name: Christopher Valdez.   MRN: [id number]    DOB: 1960/09/23   Date:10/12/2017       Progress Note  Subjective  Chief Complaint  Chief Complaint  Patient presents with  . Lab Results    Pre-Diabetic now and Dr. Carlynn Purl wanted patient to come in to discuss lab results.  . Sinus Problem    Has been congested, coughing a little.    HPI  URI: he states symptoms started a few days ago, nasal congestion, some post-nasal drainage, clear rhinorrhea and mild cough, no fever or chills. Not taking any otc medication  Anemia: normal colonoscopy in 2014, hemoglobin dropped and he came in today because he could not understand lab results over the phone. He denies sob or fatigue. He is not currently taking a multivitamin    Patient Active Problem List   Diagnosis Date Noted  . Avitaminosis D 10/29/2014  . Enlarged prostate 10/29/2014  . Other schizoaffective disorders (HCC) 10/10/2014  . Proteinuria 10/10/2014  . Hyperglycemia 10/10/2014  . Dyslipidemia 10/10/2014  . History of kidney stones 10/10/2014  . External hemorrhoid 10/10/2014  . Chronic constipation 10/10/2014  . Tobacco use 10/10/2014  . GERD without esophagitis 10/10/2014    No past surgical history on file.  Family History  Problem Relation Age of Onset  . Parkinson's disease Mother   . Deep vein thrombosis Father   . Osteoarthritis Father     Social History   Socioeconomic History  . Marital status: Single    Spouse name: Not on file  . Number of children: 0  . Years of education: Not on file  . Highest education level: High school graduate  Occupational History  . Occupation: [employer]  Social Needs  . Financial resource strain: Not very hard  . Food insecurity:    Worry: Never true    Inability: Never true  . Transportation needs:    Medical: No    Non-medical: No  Tobacco Use  . Smoking status: Current Every Day Smoker    Packs/day: 1.00    Years: 40.00    Pack years: 40.00    Types: Cigarettes  .  Smokeless tobacco: Never Used  Substance and Sexual Activity  . Alcohol use: No    Alcohol/week: 0.0 standard drinks  . Drug use: No  . Sexual activity: Never  Lifestyle  . Physical activity:    Days per week: 0 days    Minutes per session: 0 min  . Stress: Only a little  Relationships  . Social connections:    Talks on phone: More than three times a week    Gets together: Twice a week    Attends religious service: 1 to 4 times per year    Active member of club or organization: No    Attends meetings of clubs or organizations: Never    Relationship status: Never married  . Intimate partner violence:    Fear of current or ex partner: No    Emotionally abused: No    Physically abused: No    Forced sexual activity: No  Other Topics Concern  . Not on file  Social History Narrative   He lives in a group home, but able to drive      Current Outpatient Medications:  .  bismuth subsalicylate (PEPTO BISMOL) 262 MG chewable tablet, Chew 524 mg by mouth as needed., Disp: , Rfl:  .  Cholecalciferol (VITAMIN D3) 2000 units capsule, Take 1 capsule (2,000 Units total)  by mouth daily., Disp: 30 capsule, Rfl: 5 .  cloZAPine (CLOZARIL) 100 MG tablet, Take 100 mg by mouth daily. 2 tablets in the morning and 3 and half at bedtime, Disp: , Rfl:  .  divalproex (DEPAKOTE) 500 MG DR tablet, Take 500 mg by mouth at bedtime. Extended Release , Disp: , Rfl:  .  FLUoxetine (PROZAC) 40 MG capsule, Take 40 mg by mouth daily., Disp: , Rfl:  .  lisinopril (PRINIVIL,ZESTRIL) 10 MG tablet, Take 1 tablet (10 mg total) by mouth daily., Disp: 30 tablet, Rfl: 5 .  omeprazole (PRILOSEC) 40 MG capsule, TAKE ONE CAPSULE BY MOUTH EVERY MORNING FOR REFLUX, Disp: 30 capsule, Rfl: 5 .  polycarbophil (FIBERCON) 625 MG tablet, Take 625 mg by mouth daily., Disp: , Rfl:  .  simvastatin (ZOCOR) 10 MG tablet, Take 1 tablet (10 mg total) by mouth daily at 6 PM., Disp: 30 tablet, Rfl: 12  No Known Allergies  I personally  reviewed active problem list, medication list, allergies, family history, social history with the patient/caregiver today.   ROS  Ten systems reviewed and is negative except as mentioned in HPI   Objective  Vitals:   10/12/17 1141  BP: 128/74  Pulse: 99  Resp: 16  Temp: 97.8 F (36.6 C)  TempSrc: Oral  SpO2: 97%  Weight: 163 lb 12.8 oz (74.3 kg)  Height: 6\' 5"  (1.956 m)    Body mass index is 19.42 kg/m.  Physical Exam  Constitutional: Patient appears well-developed and well-nourished. No distress.  HEENT: head atraumatic, normocephalic, pupils equal and reactive to light,  neck supple, throat within normal limits, boggy turbinates and clear rhinorrhea  Cardiovascular: Normal rate, regular rhythm and normal heart sounds.  No murmur heard. No BLE edema. Pulmonary/Chest: Effort normal and breath sounds normal. No respiratory distress. Abdominal: Soft.  There is no tenderness. Psychiatric: Patient has a normal mood and affect. behavior is normal. Judgment and thought content normal.  Recent Results (from the past 2160 hour(s))  CBC with Differential/Platelet     Status: Abnormal   Collection Time: 10/04/17  2:14 PM  Result Value Ref Range   WBC 5.5 3.8 - 10.8 Thousand/uL   RBC 3.85 (L) 4.20 - 5.80 Million/uL   Hemoglobin 11.7 (L) 13.2 - 17.1 g/dL   HCT 16.1 (L) 09.6 - 04.5 %   MCV 90.4 80.0 - 100.0 fL   MCH 30.4 27.0 - 33.0 pg   MCHC 33.6 32.0 - 36.0 g/dL   RDW 40.9 81.1 - 91.4 %   Platelets 260 140 - 400 Thousand/uL   MPV 10.6 7.5 - 12.5 fL   Neutro Abs 3,405 1,500 - 7,800 cells/uL   Lymphs Abs 1,375 850 - 3,900 cells/uL   WBC mixed population 534 200 - 950 cells/uL   Eosinophils Absolute 154 15 - 500 cells/uL   Basophils Absolute 33 0 - 200 cells/uL   Neutrophils Relative % 61.9 %   Total Lymphocyte 25.0 %   Monocytes Relative 9.7 %   Eosinophils Relative 2.8 %   Basophils Relative 0.6 %  COMPLETE METABOLIC PANEL WITH GFR     Status: None   Collection Time:  10/04/17  2:14 PM  Result Value Ref Range   Glucose, Bld 111 65 - 139 mg/dL    Comment: .        Non-fasting reference interval .    BUN 18 7 - 25 mg/dL   Creat 7.82 9.56 - 2.13 mg/dL    Comment: For patients >49  years of age, the reference limit for Creatinine is approximately 13% higher for people identified as African-American. .    GFR, Est Non African American 76 > OR = 60 mL/min/1.12m2   GFR, Est African American 88 > OR = 60 mL/min/1.62m2   BUN/Creatinine Ratio NOT APPLICABLE 6 - 22 (calc)   Sodium 137 135 - 146 mmol/L   Potassium 4.4 3.5 - 5.3 mmol/L   Chloride 102 98 - 110 mmol/L   CO2 27 20 - 32 mmol/L   Calcium 9.4 8.6 - 10.3 mg/dL   Total Protein 6.4 6.1 - 8.1 g/dL   Albumin 4.1 3.6 - 5.1 g/dL   Globulin 2.3 1.9 - 3.7 g/dL (calc)   AG Ratio 1.8 1.0 - 2.5 (calc)   Total Bilirubin 0.3 0.2 - 1.2 mg/dL   Alkaline phosphatase (APISO) 109 40 - 115 U/L   AST 20 10 - 35 U/L   ALT 15 9 - 46 U/L  Hemoglobin A1c     Status: Abnormal   Collection Time: 10/04/17  2:14 PM  Result Value Ref Range   Hgb A1c MFr Bld 5.7 (H) <5.7 % of total Hgb    Comment: For someone without known diabetes, a hemoglobin  A1c value between 5.7% and 6.4% is consistent with prediabetes and should be confirmed with a  follow-up test. . For someone with known diabetes, a value <7% indicates that their diabetes is well controlled. A1c targets should be individualized based on duration of diabetes, age, comorbid conditions, and other considerations. . This assay result is consistent with an increased risk of diabetes. . Currently, no consensus exists regarding use of hemoglobin A1c for diagnosis of diabetes for children. .    Mean Plasma Glucose 117 (calc)   eAG (mmol/L) 6.5 (calc)  Lipid panel     Status: Abnormal   Collection Time: 10/04/17  2:14 PM  Result Value Ref Range   Cholesterol 170 <200 mg/dL   HDL 45 >16 mg/dL   Triglycerides 109 <604 mg/dL   LDL Cholesterol (Calc) 104 (H)  mg/dL (calc)    Comment: Reference range: <100 . Desirable range <100 mg/dL for primary prevention;   <70 mg/dL for patients with CHD or diabetic patients  with > or = 2 CHD risk factors. Marland Kitchen LDL-C is now calculated using the Martin-Hopkins  calculation, which is a validated novel method providing  better accuracy than the Friedewald equation in the  estimation of LDL-C.  Horald Pollen et al. Lenox Ahr. 5409;811(91): 2061-2068  (http://education.[facility].com/faq/FAQ164)    Total CHOL/HDL Ratio 3.8 <5.0 (calc)   Non-HDL Cholesterol (Calc) 125 <130 mg/dL (calc)    Comment: For patients with diabetes plus 1 major ASCVD risk  factor, treating to a non-HDL-C goal of <100 mg/dL  (LDL-C of <47 mg/dL) is considered a therapeutic  option.   Urine Microalbumin w/creat. ratio     Status: Abnormal   Collection Time: 10/04/17  2:14 PM  Result Value Ref Range   Creatinine, Urine 172 20 - 320 mg/dL   Microalb, Ur 9.1 mg/dL    Comment: Reference Range Not established    Microalb Creat Ratio 53 (H) <30 mcg/mg creat    Comment: . The ADA defines abnormalities in albumin excretion as follows: Marland Kitchen Category         Result (mcg/mg creatinine) . Normal                    <30 Microalbuminuria         30-299  Clinical albuminuria   > OR = 300 . The ADA recommends that at least two of three specimens collected within a 3-6 month period be abnormal before considering a patient to be within a diagnostic category.   Iron, TIBC and Ferritin Panel     Status: None   Collection Time: 10/04/17  2:14 PM  Result Value Ref Range   Iron 66 50 - 180 mcg/dL   TIBC 161 096 - 045 mcg/dL (calc)   %SAT 21 20 - 48 % (calc)   Ferritin 133 38 - 380 ng/mL  TEST AUTHORIZATION     Status: None   Collection Time: 10/04/17  2:14 PM  Result Value Ref Range   TEST NAME: IRON, TIBC AND FERRITIN PANEL    TEST CODE: [id number]    CLIENT CONTACT: CINDY CANTY    REPORT ALWAYS MESSAGE SIGNATURE      Comment: . The  laboratory testing on this patient was verbally requested or confirmed by the ordering physician or his or her authorized representative after contact with an employee of [employer]. Federal regulations require that we maintain on file written authorization for all laboratory testing.  Accordingly we are asking that the ordering physician or his or her authorized representative sign a copy of this report and promptly return it to the client service representative. . . Signature:____________________________________________________ . Please fax this signed page to [phone] or return it via your [employer] courier.      PHQ2/9: Depression screen [facility] 2/9 10/12/2017 10/04/2017 11/02/2016 10/14/2015 05/20/2015  Decreased Interest 0 0 0 0 0  Down, Depressed, Hopeless 0 0 0 0 1  PHQ - 2 Score 0 0 0 0 1  Altered sleeping 0 0 - - -  Tired, decreased energy 0 0 - - -  Change in appetite 0 0 - - -  Feeling bad or failure about yourself  0 0 - - -  Trouble concentrating - 1 - - -  Moving slowly or fidgety/restless 2 2 - - -  Suicidal thoughts 0 0 - - -  PHQ-9 Score 2 3 - - -  Difficult doing work/chores Not difficult at all Not difficult at all - - -     Fall Risk: Fall Risk  10/04/2017 04/04/2017 11/02/2016 10/14/2015 05/20/2015  Falls in the past year? No No No No No     Assessment & Plan  1. Anemia, unspecified type  Colonoscopy is up to date, gave him hemoccult cards to bring back , read instructions and showed how to do it at home  2. URI, acute  Just started this week, advised otc medication

## 2017-10-19 ENCOUNTER — Telehealth: Payer: Self-pay

## 2017-10-19 NOTE — Telephone Encounter (Signed)
No , he needs three in a row

## 2017-10-19 NOTE — Telephone Encounter (Signed)
Copied from CRM [id number]. Topic: General - Other >> Oct 19, 2017 11:37 AM Marylen Ponto wrote: Reason for CRM: Pt states he only has a sample from Monday he does not have a sample from Tuesday and Wednesday. Pt would like to know if the sample from Monday is good enough. Pt requests call back. Cb# [phone]  Per Dr. Alba Cory, patient was informed that he has to have 3 done in a row. He said ok.

## 2017-11-07 ENCOUNTER — Encounter: Payer: Self-pay | Admitting: Family Medicine

## 2017-11-07 ENCOUNTER — Ambulatory Visit (INDEPENDENT_AMBULATORY_CARE_PROVIDER_SITE_OTHER): Payer: Medicare Other | Admitting: Family Medicine

## 2017-11-07 VITALS — BP 126/72 | HR 96 | Temp 97.4°F | Ht 77.0 in | Wt 163.7 lb

## 2017-11-07 DIAGNOSIS — D638 Anemia in other chronic diseases classified elsewhere: Secondary | ICD-10-CM | POA: Diagnosis not present

## 2017-11-07 DIAGNOSIS — K5909 Other constipation: Secondary | ICD-10-CM | POA: Diagnosis not present

## 2017-11-07 DIAGNOSIS — R809 Proteinuria, unspecified: Secondary | ICD-10-CM

## 2017-11-07 DIAGNOSIS — Z0000 Encounter for general adult medical examination without abnormal findings: Secondary | ICD-10-CM | POA: Diagnosis not present

## 2017-11-07 LAB — POC HEMOCCULT BLD/STL (HOME/3-CARD/SCREEN)
Card #3 Fecal Occult Blood, POC: NEGATIVE
FECAL OCCULT BLD: NEGATIVE
Fecal Occult Blood, POC: NEGATIVE

## 2017-11-07 NOTE — Progress Notes (Signed)
Patient: Christopher Deeb., Male    DOB: Jan 14, 1960, 57 y.o.   MRN: [id number]  Visit Date: 11/07/2017  Today's Provider: Ruel Favors, MD   Chief Complaint  Patient presents with  . Annual Exam    FL2 paperwork    Subjective:   Christopher Belger. is a 57 y.o. male who presents today for his Subsequent Annual Wellness Visit.  Patient/Caregiver input:    HPI  Review of Systems Constitutional: Negative for fever or weight change.  Respiratory: Negative for cough and shortness of breath.   Cardiovascular: Negative for chest pain or palpitations.  Gastrointestinal: Negative for abdominal pain, no bowel changes.  Musculoskeletal: Negative for gait problem or joint swelling.  Skin: Negative for rash.  Neurological: Negative for dizziness or headache.  No other specific complaints in a complete review of systems (except as listed in HPI above).  Past Medical History:  Diagnosis Date  . Chronic constipation   . Dyslipidemia   . GERD (gastroesophageal reflux disease)   . Proteinuria   . Schizo-affective schizophrenia (HCC)   . Vitamin D deficiency     History reviewed. No pertinent surgical history.  Family History  Problem Relation Age of Onset  . Parkinson's disease Mother   . Deep vein thrombosis Father   . Osteoarthritis Father     Social History   Socioeconomic History  . Marital status: Single    Spouse name: Not on file  . Number of children: 0  . Years of education: Not on file  . Highest education level: High school graduate  Occupational History  . Occupation: [employer]  Social Needs  . Financial resource strain: Not very hard  . Food insecurity:    Worry: Never true    Inability: Never true  . Transportation needs:    Medical: No    Non-medical: No  Tobacco Use  . Smoking status: Current Every Day Smoker    Packs/day: 1.00    Years: 40.00    Pack years: 40.00    Types: Cigarettes  . Smokeless tobacco: Never Used  Substance and Sexual Activity   . Alcohol use: No    Alcohol/week: 0.0 standard drinks  . Drug use: No  . Sexual activity: Never  Lifestyle  . Physical activity:    Days per week: 0 days    Minutes per session: 0 min  . Stress: Only a little  Relationships  . Social connections:    Talks on phone: More than three times a week    Gets together: Twice a week    Attends religious service: 1 to 4 times per year    Active member of club or organization: No    Attends meetings of clubs or organizations: Never    Relationship status: Never married  . Intimate partner violence:    Fear of current or ex partner: No    Emotionally abused: No    Physically abused: No    Forced sexual activity: No  Other Topics Concern  . Not on file  Social History Narrative   He lives in a group home, but able to drive     Outpatient Encounter Medications as of 11/07/2017  Medication Sig  . Cholecalciferol (VITAMIN D3) 2000 units capsule Take 1 capsule (2,000 Units total) by mouth daily.  . cloZAPine (CLOZARIL) 100 MG tablet Take 100 mg by mouth daily. 2 tablets in the morning and 3 and half at bedtime  . divalproex (DEPAKOTE) 500 MG DR  tablet Take 500 mg by mouth at bedtime. Extended Release   . FLUoxetine (PROZAC) 40 MG capsule Take 40 mg by mouth daily.  Marland Kitchen lisinopril (PRINIVIL,ZESTRIL) 10 MG tablet Take 1 tablet (10 mg total) by mouth daily.  Marland Kitchen omeprazole (PRILOSEC) 40 MG capsule TAKE ONE CAPSULE BY MOUTH EVERY MORNING FOR REFLUX  . polycarbophil (FIBERCON) 625 MG tablet Take 625 mg by mouth daily.  . simvastatin (ZOCOR) 10 MG tablet Take 1 tablet (10 mg total) by mouth daily at 6 PM.  . bismuth subsalicylate (PEPTO BISMOL) 262 MG chewable tablet Chew 524 mg by mouth as needed.   No facility-administered encounter medications on file as of 11/07/2017.     No Known Allergies  Care Team Updated in EHR: Yes - he goes to [facility] but not sure if it is Dr. Cherylann Ratel anymore  Last Vision Exam:  Wears corrective lenses: Yes Last  Dental Exam: not sure, advised to go back  Last Hearing Exam: Wears Hearing Aids: No  Functional Ability / Safety Screening 1.  Was the timed Get Up and Go test longer than 30 seconds?  yes 2.  Does the patient need help with the phone, transportation, shopping,      preparing meals, housework, laundry, medications, or managing money?  yes He is able to drive , work and come to doctor's visits on his own 3.  Does the patient's home have:  loose throw rugs in the hallway?   no      Grab bars in the bathroom? yes      Handrails on the stairs?   yes      Poor lighting?   no 4.  Has the patient noticed any hearing difficulties?   no  Diet Recall and Exercise Regimen: he eats at the group home, he does not drink sodas on a regular basis, not very physical active  Fall Risk:  See screening under Objective Information  Depression Screen: sees psychiatrist.  See screening under Objective Information  Advanced Directives: A voluntary discussion about advance care planning including the explanation and discussion of advance directives was discussed with the patient. Explanation about the health care proxy and living will was reviewed.  During this discussion, the patient was able to identify a health care proxy as his mother.  Does patient have a HCPOA?    Not sure  If yes, name and contact information:  He states likely his parents Does patient have a living will or MOST form?  Not sure  Cancer Screenings: Skin: discussed atypical lesions  Lung: Low Dose CT Chest recommended if Age 58-80 years, 30 pack-year currently smoking OR have quit w/in 15years. Patient does qualify.  Lifestyle risk factor issued reviewed: Diet, exercise, weight management, advised patient smoking is not healthy, nutrition/diet.   Prostate: discussed USPTF  Colon: up to date  Additional Screenings:  Hepatitis B/HIV/Syphillis:N/A Hepatitis C Screening: up to date 06/2012 Intimate Partner Violence: negative screen    Objective:   Vitals: BP 126/72   Pulse 96   Temp (!) 97.4 F (36.3 C)   Ht 6\' 5"  (1.956 m)   Wt 163 lb 11.2 oz (74.3 kg)   SpO2 96%   BMI 19.41 kg/m  Body mass index is 19.41 kg/m.  Lab Results  Component Value Date   CHOL 170 10/04/2017   CHOL 197 11/02/2016   CHOL 178 10/20/2015   Lab Results  Component Value Date   HDL 45 10/04/2017   HDL 51 11/02/2016   HDL  42 10/20/2015   Lab Results  Component Value Date   LDLCALC 104 (H) 10/04/2017   LDLCALC 121 (H) 11/02/2016   LDLCALC 112 (H) 10/20/2015   Lab Results  Component Value Date   TRIG 114 10/04/2017   TRIG 131 11/02/2016   TRIG 120 10/20/2015   Lab Results  Component Value Date   CHOLHDL 3.8 10/04/2017   CHOLHDL 3.9 11/02/2016   CHOLHDL 4.4 10/28/2014   No results found for: LDLDIRECT  No exam data present  Physical Exam Constitutional: Patient appears well-developed and well-nourished.  No distress.  HEENT: head atraumatic, normocephalic, pupils equal and reactive to light, neck supple, throat within normal limits Cardiovascular: Normal rate, regular rhythm and normal heart sounds.  No murmur heard. No BLE edema. Pulmonary/Chest: Effort normal and breath sounds normal. No respiratory distress. Abdominal: Soft.  There is no tenderness. Psychiatric: Patient has a normal mood and affect. behavior is normal.   Cognitive Testing - 6-CIT  Correct? Score   What year is it? yes 0 Yes = 0    No = 4  What month is it? yes 0 Yes = 0    No = 3  Remember:     Floyde Parkins, [address], [place]     What time is it? yes 0 Yes = 0    No = 3  Count backwards from 20 to 1 yes 0 Correct = 0    1 error = 2   More than 1 error = 4  Say the months of the year in reverse. yes 0 Correct = 0    1 error = 2   More than 1 error = 4  What address did I ask you to remember? yes 2 Correct = 0  1 error = 2    2 error = 4    3 error = 6    4 error = 8    All wrong = 10       TOTAL SCORE  2/28   Interpretation:  Normal   Normal (0-7) Abnormal (8-28)   Fall Risk: Fall Risk  11/07/2017 10/04/2017 04/04/2017 11/02/2016 10/14/2015  Falls in the past year? No No No No No    Depression Screen Depression screen [facility] 2/9 11/07/2017 10/12/2017 10/04/2017 11/02/2016 10/14/2015  Decreased Interest 0 0 0 0 0  Down, Depressed, Hopeless 0 0 0 0 0  PHQ - 2 Score 0 0 0 0 0  Altered sleeping 0 0 0 - -  Tired, decreased energy 0 0 0 - -  Change in appetite 0 0 0 - -  Feeling bad or failure about yourself  0 0 0 - -  Trouble concentrating 0 - 1 - -  Moving slowly or fidgety/restless 0 2 2 - -  Suicidal thoughts 0 0 0 - -  PHQ-9 Score 0 2 3 - -  Difficult doing work/chores Not difficult at all Not difficult at all Not difficult at all - -    Recent Results (from the past 2160 hour(s))  CBC with Differential/Platelet     Status: Abnormal   Collection Time: 10/04/17  2:14 PM  Result Value Ref Range   WBC 5.5 3.8 - 10.8 Thousand/uL   RBC 3.85 (L) 4.20 - 5.80 Million/uL   Hemoglobin 11.7 (L) 13.2 - 17.1 g/dL   HCT 16.1 (L) 09.6 - 04.5 %   MCV 90.4 80.0 - 100.0 fL   MCH 30.4 27.0 - 33.0 pg   MCHC 33.6  32.0 - 36.0 g/dL   RDW 16.1 09.6 - 04.5 %   Platelets 260 140 - 400 Thousand/uL   MPV 10.6 7.5 - 12.5 fL   Neutro Abs 3,405 1,500 - 7,800 cells/uL   Lymphs Abs 1,375 850 - 3,900 cells/uL   WBC mixed population 534 200 - 950 cells/uL   Eosinophils Absolute 154 15 - 500 cells/uL   Basophils Absolute 33 0 - 200 cells/uL   Neutrophils Relative % 61.9 %   Total Lymphocyte 25.0 %   Monocytes Relative 9.7 %   Eosinophils Relative 2.8 %   Basophils Relative 0.6 %  COMPLETE METABOLIC PANEL WITH GFR     Status: None   Collection Time: 10/04/17  2:14 PM  Result Value Ref Range   Glucose, Bld 111 65 - 139 mg/dL    Comment: .        Non-fasting reference interval .    BUN 18 7 - 25 mg/dL   Creat 4.09 8.11 - 9.14 mg/dL    Comment: For patients >42 years of age, the reference limit for Creatinine is approximately 13% higher  for people identified as African-American. .    GFR, Est Non African American 76 > OR = 60 mL/min/1.77m2   GFR, Est African American 88 > OR = 60 mL/min/1.16m2   BUN/Creatinine Ratio NOT APPLICABLE 6 - 22 (calc)   Sodium 137 135 - 146 mmol/L   Potassium 4.4 3.5 - 5.3 mmol/L   Chloride 102 98 - 110 mmol/L   CO2 27 20 - 32 mmol/L   Calcium 9.4 8.6 - 10.3 mg/dL   Total Protein 6.4 6.1 - 8.1 g/dL   Albumin 4.1 3.6 - 5.1 g/dL   Globulin 2.3 1.9 - 3.7 g/dL (calc)   AG Ratio 1.8 1.0 - 2.5 (calc)   Total Bilirubin 0.3 0.2 - 1.2 mg/dL   Alkaline phosphatase (APISO) 109 40 - 115 U/L   AST 20 10 - 35 U/L   ALT 15 9 - 46 U/L  Hemoglobin A1c     Status: Abnormal   Collection Time: 10/04/17  2:14 PM  Result Value Ref Range   Hgb A1c MFr Bld 5.7 (H) <5.7 % of total Hgb    Comment: For someone without known diabetes, a hemoglobin  A1c value between 5.7% and 6.4% is consistent with prediabetes and should be confirmed with a  follow-up test. . For someone with known diabetes, a value <7% indicates that their diabetes is well controlled. A1c targets should be individualized based on duration of diabetes, age, comorbid conditions, and other considerations. . This assay result is consistent with an increased risk of diabetes. . Currently, no consensus exists regarding use of hemoglobin A1c for diagnosis of diabetes for children. .    Mean Plasma Glucose 117 (calc)   eAG (mmol/L) 6.5 (calc)  Lipid panel     Status: Abnormal   Collection Time: 10/04/17  2:14 PM  Result Value Ref Range   Cholesterol 170 <200 mg/dL   HDL 45 >78 mg/dL   Triglycerides 295 <621 mg/dL   LDL Cholesterol (Calc) 104 (H) mg/dL (calc)    Comment: Reference range: <100 . Desirable range <100 mg/dL for primary prevention;   <70 mg/dL for patients with CHD or diabetic patients  with > or = 2 CHD risk factors. Marland Kitchen LDL-C is now calculated using the Martin-Hopkins  calculation, which is a validated novel method  providing  better accuracy than the Friedewald equation in the  estimation of LDL-C.  Horald Pollen et al. Lenox Ahr. 5409;811(91): 2061-2068  (http://education.[facility].com/faq/FAQ164)    Total CHOL/HDL Ratio 3.8 <5.0 (calc)   Non-HDL Cholesterol (Calc) 125 <130 mg/dL (calc)    Comment: For patients with diabetes plus 1 major ASCVD risk  factor, treating to a non-HDL-C goal of <100 mg/dL  (LDL-C of <47 mg/dL) is considered a therapeutic  option.   Urine Microalbumin w/creat. ratio     Status: Abnormal   Collection Time: 10/04/17  2:14 PM  Result Value Ref Range   Creatinine, Urine 172 20 - 320 mg/dL   Microalb, Ur 9.1 mg/dL    Comment: Reference Range Not established    Microalb Creat Ratio 53 (H) <30 mcg/mg creat    Comment: . The ADA defines abnormalities in albumin excretion as follows: Marland Kitchen Category         Result (mcg/mg creatinine) . Normal                    <30 Microalbuminuria         30-299  Clinical albuminuria   > OR = 300 . The ADA recommends that at least two of three specimens collected within a 3-6 month period be abnormal before considering a patient to be within a diagnostic category.   Iron, TIBC and Ferritin Panel     Status: None   Collection Time: 10/04/17  2:14 PM  Result Value Ref Range   Iron 66 50 - 180 mcg/dL   TIBC 829 562 - 130 mcg/dL (calc)   %SAT 21 20 - 48 % (calc)   Ferritin 133 38 - 380 ng/mL  TEST AUTHORIZATION     Status: None   Collection Time: 10/04/17  2:14 PM  Result Value Ref Range   TEST NAME: IRON, TIBC AND FERRITIN PANEL    TEST CODE: [id number]    CLIENT CONTACT: CINDY CANTY    REPORT ALWAYS MESSAGE SIGNATURE      Comment: . The laboratory testing on this patient was verbally requested or confirmed by the ordering physician or his or her authorized representative after contact with an employee of [employer]. Federal regulations require that we maintain on file written authorization for all laboratory testing.   Accordingly we are asking that the ordering physician or his or her authorized representative sign a copy of this report and promptly return it to the client service representative. . . Signature:____________________________________________________ . Please fax this signed page to [phone] or return it via your [employer] courier.   POC Hemoccult Bld/Stl (3-Cd Home Screen)     Status: Normal   Collection Time: 11/07/17  1:47 PM  Result Value Ref Range   Card #1 Date 11/01/2017    Fecal Occult Blood, POC Negative Negative   Card #2 Date 11/03/2017    Card #2 Fecal Occult Blod, POC Negative    Card #3 Date 11/04/2017    Card #3 Fecal Occult Blood, POC Negative      Assessment & Plan:     1. Medicare annual wellness visit, subsequent   2. Anemia of chronic disease  - POC Hemoccult Bld/Stl (3-Cd Home Screen)  3. Chronic constipation  - POC Hemoccult Bld/Stl (3-Cd Home Screen)  4. Microalbuminuria  He prefers not going to see nephrologist at this time, but wants to have it monitored here, already of ACE and we will recheck in 6-12 months    Exercise Activities and Dietary recommendations  Goals:  He is not sure   Discussed health benefits of  physical activity, and encouraged him to engage in regular exercise appropriate for his age and condition.   Immunization History  Administered Date(s) Administered  . Influenza Split 11/21/2007, 10/01/2009  . Influenza, Seasonal, Injecte, Preservative Fre 12/06/2011  . Influenza,inj,Quad PF,6+ Mos 10/10/2014, 10/14/2015, 11/02/2016, 10/04/2017  . Influenza-Unspecified 09/13/2012  . Pneumococcal Polysaccharide-23 12/06/2011  . Tdap 05/28/2009    Health Maintenance  Topic Date Due  . HIV Screening  02/15/2019 (Originally 04/18/1974)  . TETANUS/TDAP  05/29/2019  . COLONOSCOPY  07/03/2022  . INFLUENZA VACCINE  Completed  . Hepatitis C Screening  Completed     No orders of the defined types were placed in this  encounter.   Current Outpatient Medications:  .  Cholecalciferol (VITAMIN D3) 2000 units capsule, Take 1 capsule (2,000 Units total) by mouth daily., Disp: 30 capsule, Rfl: 5 .  cloZAPine (CLOZARIL) 100 MG tablet, Take 100 mg by mouth daily. 2 tablets in the morning and 3 and half at bedtime, Disp: , Rfl:  .  divalproex (DEPAKOTE) 500 MG DR tablet, Take 500 mg by mouth at bedtime. Extended Release , Disp: , Rfl:  .  FLUoxetine (PROZAC) 40 MG capsule, Take 40 mg by mouth daily., Disp: , Rfl:  .  lisinopril (PRINIVIL,ZESTRIL) 10 MG tablet, Take 1 tablet (10 mg total) by mouth daily., Disp: 30 tablet, Rfl: 5 .  omeprazole (PRILOSEC) 40 MG capsule, TAKE ONE CAPSULE BY MOUTH EVERY MORNING FOR REFLUX, Disp: 30 capsule, Rfl: 5 .  polycarbophil (FIBERCON) 625 MG tablet, Take 625 mg by mouth daily., Disp: , Rfl:  .  simvastatin (ZOCOR) 10 MG tablet, Take 1 tablet (10 mg total) by mouth daily at 6 PM., Disp: 30 tablet, Rfl: 12 .  bismuth subsalicylate (PEPTO BISMOL) 262 MG chewable tablet, Chew 524 mg by mouth as needed., Disp: , Rfl:  There are no discontinued medications.  I have personally reviewed and addressed the Medicare Annual Wellness health risk assessment questionnaire and have noted the following in the patient's chart:  A.         Medical and social history & family history B.         Use of alcohol, tobacco or illicit drugs  C.         Current medications and supplements D.         Functional and Cognitive ability and status - stable  E.         Nutritional status F.         Physical activity G.        Advance directives H.         List of other physicians I.          Hospitalizations, surgeries, and ER visits in previous 12 months J.         Vitals K.         Screenings such as hearing and vision if needed, cognitive and depression L.         Referrals and appointments - None   In addition, I have reviewed and discussed with patient certain preventive protocols, quality metrics, and  best practice recommendations. A written personalized care plan for preventive services as well as general preventive health recommendations were provided to patient.   See attached scanned questionnaire for additional information.   No follow-ups on file.

## 2017-11-14 ENCOUNTER — Encounter: Payer: Self-pay | Admitting: Family Medicine

## 2017-11-14 ENCOUNTER — Ambulatory Visit (INDEPENDENT_AMBULATORY_CARE_PROVIDER_SITE_OTHER): Payer: Medicare Other | Admitting: Family Medicine

## 2017-11-14 VITALS — BP 128/70 | HR 97 | Temp 97.4°F | Resp 16 | Ht 77.0 in | Wt 170.5 lb

## 2017-11-14 DIAGNOSIS — D649 Anemia, unspecified: Secondary | ICD-10-CM

## 2017-11-14 DIAGNOSIS — R809 Proteinuria, unspecified: Secondary | ICD-10-CM

## 2017-11-14 LAB — CBC WITH DIFFERENTIAL/PLATELET
BASOS PCT: 0.8 %
Basophils Absolute: 52 cells/uL (ref 0–200)
EOS PCT: 2.9 %
Eosinophils Absolute: 189 cells/uL (ref 15–500)
HEMATOCRIT: 35.4 % — AB (ref 38.5–50.0)
HEMOGLOBIN: 12.1 g/dL — AB (ref 13.2–17.1)
LYMPHS ABS: 1502 {cells}/uL (ref 850–3900)
MCH: 31.7 pg (ref 27.0–33.0)
MCHC: 34.2 g/dL (ref 32.0–36.0)
MCV: 92.7 fL (ref 80.0–100.0)
MPV: 10.9 fL (ref 7.5–12.5)
Monocytes Relative: 8.5 %
NEUTROS ABS: 4206 {cells}/uL (ref 1500–7800)
Neutrophils Relative %: 64.7 %
Platelets: 259 10*3/uL (ref 140–400)
RBC: 3.82 10*6/uL — AB (ref 4.20–5.80)
RDW: 13.3 % (ref 11.0–15.0)
Total Lymphocyte: 23.1 %
WBC mixed population: 553 cells/uL (ref 200–950)
WBC: 6.5 10*3/uL (ref 3.8–10.8)

## 2017-11-14 NOTE — Progress Notes (Signed)
Name: Christopher Valdez.   MRN: [id number]    DOB: [birth year]   Date:11/14/2017       Progress Note  Subjective  Chief Complaint  Chief Complaint  Patient presents with  . Follow-up    1 month f/u    HPI  Patient returned today to discuss lab results. On his last labs he was found to have anemia that was not iron deficiency and microalbuminuria. He denies any fatigue, explained it may be anemia of chronic diease. BP is at goal, GFR was normal. He has a good urine output. No pruritus.    Patient Active Problem List   Diagnosis Date Noted  . Avitaminosis D 10/29/2014  . Enlarged prostate 10/29/2014  . Other schizoaffective disorders (HCC) 10/10/2014  . Proteinuria 10/10/2014  . Hyperglycemia 10/10/2014  . Dyslipidemia 10/10/2014  . History of kidney stones 10/10/2014  . External hemorrhoid 10/10/2014  . Chronic constipation 10/10/2014  . Tobacco use 10/10/2014  . GERD without esophagitis 10/10/2014    History reviewed. No pertinent surgical history.  Family History  Problem Relation Age of Onset  . Parkinson's disease Mother   . Deep vein thrombosis Father   . Osteoarthritis Father     Social History   Socioeconomic History  . Marital status: Single    Spouse name: Not on file  . Number of children: 0  . Years of education: Not on file  . Highest education level: High school graduate  Occupational History  . Occupation: [employer]  Social Needs  . Financial resource strain: Not very hard  . Food insecurity:    Worry: Never true    Inability: Never true  . Transportation needs:    Medical: No    Non-medical: No  Tobacco Use  . Smoking status: Current Every Day Smoker    Packs/day: 1.00    Years: 40.00    Pack years: 40.00    Types: Cigarettes  . Smokeless tobacco: Never Used  Substance and Sexual Activity  . Alcohol use: No    Alcohol/week: 0.0 standard drinks  . Drug use: No  . Sexual activity: Never  Lifestyle  . Physical activity:    Days per week: 0  days    Minutes per session: 0 min  . Stress: Only a little  Relationships  . Social connections:    Talks on phone: More than three times a week    Gets together: Twice a week    Attends religious service: 1 to 4 times per year    Active member of club or organization: No    Attends meetings of clubs or organizations: Never    Relationship status: Never married  . Intimate partner violence:    Fear of current or ex partner: No    Emotionally abused: No    Physically abused: No    Forced sexual activity: No  Other Topics Concern  . Not on file  Social History Narrative   He lives in a group home, but able to drive      Current Outpatient Medications:  .  bismuth subsalicylate (PEPTO BISMOL) 262 MG chewable tablet, Chew 524 mg by mouth as needed., Disp: , Rfl:  .  Cholecalciferol (VITAMIN D3) 2000 units capsule, Take 1 capsule (2,000 Units total) by mouth daily., Disp: 30 capsule, Rfl: 5 .  cloZAPine (CLOZARIL) 100 MG tablet, Take 100 mg by mouth daily. 2 tablets in the morning and 3 and half at bedtime, Disp: , Rfl:  .  divalproex (DEPAKOTE) 500 MG DR tablet, Take 500 mg by mouth at bedtime. Extended Release , Disp: , Rfl:  .  FLUoxetine (PROZAC) 40 MG capsule, Take 40 mg by mouth daily., Disp: , Rfl:  .  lisinopril (PRINIVIL,ZESTRIL) 10 MG tablet, Take 1 tablet (10 mg total) by mouth daily., Disp: 30 tablet, Rfl: 5 .  omeprazole (PRILOSEC) 40 MG capsule, TAKE ONE CAPSULE BY MOUTH EVERY MORNING FOR REFLUX, Disp: 30 capsule, Rfl: 5 .  polycarbophil (FIBERCON) 625 MG tablet, Take 625 mg by mouth daily., Disp: , Rfl:  .  simvastatin (ZOCOR) 10 MG tablet, Take 1 tablet (10 mg total) by mouth daily at 6 PM., Disp: 30 tablet, Rfl: 12  No Known Allergies  I personally reviewed active problem list, medication list, allergies, family history, social history with the patient/caregiver today.   ROS  Ten systems reviewed and is negative except as mentioned in HPI   Objective  Vitals:    11/14/17 1125  BP: 128/70  Pulse: 97  Resp: 16  Temp: (!) 97.4 F (36.3 C)  TempSrc: Oral  SpO2: 97%  Weight: 170 lb 8 oz (77.3 kg)  Height: 6\' 5"  (1.956 m)    Body mass index is 20.22 kg/m.  Physical Exam  Constitutional: Patient appears well-developed and well-nourished.No distress.  HEENT: head atraumatic, normocephalic, pupils equal and reactive to light, neck supple, throat within normal limits Cardiovascular: Normal rate, regular rhythm and normal heart sounds.  No murmur heard. No BLE edema. Pulmonary/Chest: Effort normal and breath sounds normal. No respiratory distress. Abdominal: Soft.  There is no tenderness. Psychiatric: Patient has a normal mood and affect. behavior is normal. Judgment and thought content normal.  Recent Results (from the past 2160 hour(s))  CBC with Differential/Platelet     Status: Abnormal   Collection Time: 10/04/17  2:14 PM  Result Value Ref Range   WBC 5.5 3.8 - 10.8 Thousand/uL   RBC 3.85 (L) 4.20 - 5.80 Million/uL   Hemoglobin 11.7 (L) 13.2 - 17.1 g/dL   HCT 69.6 (L) 29.5 - 28.4 %   MCV 90.4 80.0 - 100.0 fL   MCH 30.4 27.0 - 33.0 pg   MCHC 33.6 32.0 - 36.0 g/dL   RDW 13.2 44.0 - 10.2 %   Platelets 260 140 - 400 Thousand/uL   MPV 10.6 7.5 - 12.5 fL   Neutro Abs 3,405 1,500 - 7,800 cells/uL   Lymphs Abs 1,375 850 - 3,900 cells/uL   WBC mixed population 534 200 - 950 cells/uL   Eosinophils Absolute 154 15 - 500 cells/uL   Basophils Absolute 33 0 - 200 cells/uL   Neutrophils Relative % 61.9 %   Total Lymphocyte 25.0 %   Monocytes Relative 9.7 %   Eosinophils Relative 2.8 %   Basophils Relative 0.6 %  COMPLETE METABOLIC PANEL WITH GFR     Status: None   Collection Time: 10/04/17  2:14 PM  Result Value Ref Range   Glucose, Bld 111 65 - 139 mg/dL    Comment: .        Non-fasting reference interval .    BUN 18 7 - 25 mg/dL   Creat 7.25 3.66 - 4.40 mg/dL    Comment: For patients >68 years of age, the reference limit for  Creatinine is approximately 13% higher for people identified as African-American. .    GFR, Est Non African American 76 > OR = 60 mL/min/1.31m2   GFR, Est African American 88 > OR = 60 mL/min/1.71m2  BUN/Creatinine Ratio NOT APPLICABLE 6 - 22 (calc)   Sodium 137 135 - 146 mmol/L   Potassium 4.4 3.5 - 5.3 mmol/L   Chloride 102 98 - 110 mmol/L   CO2 27 20 - 32 mmol/L   Calcium 9.4 8.6 - 10.3 mg/dL   Total Protein 6.4 6.1 - 8.1 g/dL   Albumin 4.1 3.6 - 5.1 g/dL   Globulin 2.3 1.9 - 3.7 g/dL (calc)   AG Ratio 1.8 1.0 - 2.5 (calc)   Total Bilirubin 0.3 0.2 - 1.2 mg/dL   Alkaline phosphatase (APISO) 109 40 - 115 U/L   AST 20 10 - 35 U/L   ALT 15 9 - 46 U/L  Hemoglobin A1c     Status: Abnormal   Collection Time: 10/04/17  2:14 PM  Result Value Ref Range   Hgb A1c MFr Bld 5.7 (H) <5.7 % of total Hgb    Comment: For someone without known diabetes, a hemoglobin  A1c value between 5.7% and 6.4% is consistent with prediabetes and should be confirmed with a  follow-up test. . For someone with known diabetes, a value <7% indicates that their diabetes is well controlled. A1c targets should be individualized based on duration of diabetes, age, comorbid conditions, and other considerations. . This assay result is consistent with an increased risk of diabetes. . Currently, no consensus exists regarding use of hemoglobin A1c for diagnosis of diabetes for children. .    Mean Plasma Glucose 117 (calc)   eAG (mmol/L) 6.5 (calc)  Lipid panel     Status: Abnormal   Collection Time: 10/04/17  2:14 PM  Result Value Ref Range   Cholesterol 170 <200 mg/dL   HDL 45 >40 mg/dL   Triglycerides 981 <191 mg/dL   LDL Cholesterol (Calc) 104 (H) mg/dL (calc)    Comment: Reference range: <100 . Desirable range <100 mg/dL for primary prevention;   <70 mg/dL for patients with CHD or diabetic patients  with > or = 2 CHD risk factors. Marland Kitchen LDL-C is now calculated using the Martin-Hopkins  calculation,  which is a validated novel method providing  better accuracy than the Friedewald equation in the  estimation of LDL-C.  Horald Pollen et al. Lenox Ahr. 4782;956(21): 2061-2068  (http://education.[facility].com/faq/FAQ164)    Total CHOL/HDL Ratio 3.8 <5.0 (calc)   Non-HDL Cholesterol (Calc) 125 <130 mg/dL (calc)    Comment: For patients with diabetes plus 1 major ASCVD risk  factor, treating to a non-HDL-C goal of <100 mg/dL  (LDL-C of <30 mg/dL) is considered a therapeutic  option.   Urine Microalbumin w/creat. ratio     Status: Abnormal   Collection Time: 10/04/17  2:14 PM  Result Value Ref Range   Creatinine, Urine 172 20 - 320 mg/dL   Microalb, Ur 9.1 mg/dL    Comment: Reference Range Not established    Microalb Creat Ratio 53 (H) <30 mcg/mg creat    Comment: . The ADA defines abnormalities in albumin excretion as follows: Marland Kitchen Category         Result (mcg/mg creatinine) . Normal                    <30 Microalbuminuria         30-299  Clinical albuminuria   > OR = 300 . The ADA recommends that at least two of three specimens collected within a 3-6 month period be abnormal before considering a patient to be within a diagnostic category.   Iron, TIBC and Ferritin Panel  Status: None   Collection Time: 10/04/17  2:14 PM  Result Value Ref Range   Iron 66 50 - 180 mcg/dL   TIBC 161 096 - 045 mcg/dL (calc)   %SAT 21 20 - 48 % (calc)   Ferritin 133 38 - 380 ng/mL  TEST AUTHORIZATION     Status: None   Collection Time: 10/04/17  2:14 PM  Result Value Ref Range   TEST NAME: IRON, TIBC AND FERRITIN PANEL    TEST CODE: [id number]    CLIENT CONTACT: CINDY CANTY    REPORT ALWAYS MESSAGE SIGNATURE      Comment: . The laboratory testing on this patient was verbally requested or confirmed by the ordering physician or his or her authorized representative after contact with an employee of [employer]. Federal regulations require that we maintain on file written authorization  for all laboratory testing.  Accordingly we are asking that the ordering physician or his or her authorized representative sign a copy of this report and promptly return it to the client service representative. . . Signature:____________________________________________________ . Please fax this signed page to [phone] or return it via your [employer] courier.   POC Hemoccult Bld/Stl (3-Cd Home Screen)     Status: Normal   Collection Time: 11/07/17  1:47 PM  Result Value Ref Range   Card #1 Date 11/01/2017    Fecal Occult Blood, POC Negative Negative   Card #2 Date 11/03/2017    Card #2 Fecal Occult Blod, POC Negative    Card #3 Date 11/04/2017    Card #3 Fecal Occult Blood, POC Negative       PHQ2/9: Depression screen [facility] 2/9 11/14/2017 11/07/2017 10/12/2017 10/04/2017 11/02/2016  Decreased Interest 0 0 0 0 0  Down, Depressed, Hopeless 0 0 0 0 0  PHQ - 2 Score 0 0 0 0 0  Altered sleeping 1 0 0 0 -  Tired, decreased energy 0 0 0 0 -  Change in appetite 0 0 0 0 -  Feeling bad or failure about yourself  0 0 0 0 -  Trouble concentrating 0 0 - 1 -  Moving slowly or fidgety/restless 0 0 2 2 -  Suicidal thoughts 0 0 0 0 -  PHQ-9 Score 1 0 2 3 -  Difficult doing work/chores Not difficult at all Not difficult at all Not difficult at all Not difficult at all -     Fall Risk: Fall Risk  11/14/2017 11/07/2017 10/04/2017 04/04/2017 11/02/2016  Falls in the past year? 0 No No No No    Functional Status Survey: Is the patient deaf or have difficulty hearing?: No Does the patient have difficulty seeing, even when wearing glasses/contacts?: No Does the patient have difficulty concentrating, remembering, or making decisions?: No Does the patient have difficulty walking or climbing stairs?: No Does the patient have difficulty dressing or bathing?: No Does the patient have difficulty doing errands alone such as visiting a doctor's office or shopping?: No    Assessment &  Plan  1. Microalbuminuria  - Ambulatory referral to Nephrology  2. Anemia, unspecified type  - CBC with Differential/Platelet - Ambulatory referral to Nephrology

## 2017-11-21 ENCOUNTER — Telehealth: Payer: Self-pay | Admitting: *Deleted

## 2017-11-21 NOTE — Telephone Encounter (Signed)
Patient called to follow up on lab results.  Anemia is stable, iron studies negative, may be anemia of chronic disease, getting referred to nephrologist  Patient given information about referral and he will contact them for appointment.

## 2017-12-20 DIAGNOSIS — R946 Abnormal results of thyroid function studies: Secondary | ICD-10-CM | POA: Diagnosis not present

## 2017-12-20 DIAGNOSIS — R319 Hematuria, unspecified: Secondary | ICD-10-CM | POA: Diagnosis not present

## 2017-12-20 DIAGNOSIS — R809 Proteinuria, unspecified: Secondary | ICD-10-CM | POA: Diagnosis not present

## 2017-12-27 ENCOUNTER — Other Ambulatory Visit: Payer: Self-pay | Admitting: Nephrology

## 2017-12-27 DIAGNOSIS — R809 Proteinuria, unspecified: Secondary | ICD-10-CM

## 2018-01-05 ENCOUNTER — Other Ambulatory Visit: Payer: Self-pay

## 2018-01-05 MED ORDER — OMEPRAZOLE 40 MG PO CPDR: DELAYED_RELEASE_CAPSULE | ORAL | 5 refills | Status: DC

## 2018-01-05 NOTE — Telephone Encounter (Signed)
Refill request for general medication: Omeprazole DR 40 mg  Last office visit: 11/14/2017  Last physical exam: 11/02/2016  Follow-ups on file. 04/03/2018

## 2018-01-08 ENCOUNTER — Other Ambulatory Visit: Payer: Self-pay

## 2018-01-09 ENCOUNTER — Ambulatory Visit
Admission: RE | Admit: 2018-01-09 | Discharge: 2018-01-09 | Disposition: A | Discharge status: Home / Self Care | Payer: Medicare Other | Source: Ambulatory Visit | Attending: Nephrology | Admitting: Nephrology

## 2018-01-09 ENCOUNTER — Other Ambulatory Visit: Payer: Self-pay

## 2018-01-09 DIAGNOSIS — R809 Proteinuria, unspecified: Secondary | ICD-10-CM | POA: Insufficient documentation

## 2018-01-09 DIAGNOSIS — N132 Hydronephrosis with renal and ureteral calculous obstruction: Secondary | ICD-10-CM | POA: Diagnosis not present

## 2018-01-09 MED ORDER — VITAMIN D3 50 MCG (2000 UT) PO CAPS: 2000.0000 [IU] | ORAL_CAPSULE | Freq: Every day | ORAL | 5 refills | Status: DC

## 2018-01-09 NOTE — Telephone Encounter (Signed)
Refill request for general medication: Vitamin D3 2000 units  Last office visit: 11/14/2017  Last physical exam: 11/07/2017  Follow-ups on file. 04/03/2018

## 2018-01-30 DIAGNOSIS — R809 Proteinuria, unspecified: Secondary | ICD-10-CM | POA: Diagnosis not present

## 2018-01-30 DIAGNOSIS — N200 Calculus of kidney: Secondary | ICD-10-CM | POA: Diagnosis not present

## 2018-01-30 DIAGNOSIS — R319 Hematuria, unspecified: Secondary | ICD-10-CM | POA: Diagnosis not present

## 2018-02-01 ENCOUNTER — Other Ambulatory Visit: Payer: Self-pay | Admitting: Nephrology

## 2018-02-01 DIAGNOSIS — R809 Proteinuria, unspecified: Secondary | ICD-10-CM

## 2018-02-01 DIAGNOSIS — R319 Hematuria, unspecified: Secondary | ICD-10-CM

## 2018-02-20 DIAGNOSIS — H25813 Combined forms of age-related cataract, bilateral: Secondary | ICD-10-CM | POA: Diagnosis not present

## 2018-02-20 DIAGNOSIS — H5213 Myopia, bilateral: Secondary | ICD-10-CM | POA: Diagnosis not present

## 2018-03-20 DIAGNOSIS — H524 Presbyopia: Secondary | ICD-10-CM | POA: Diagnosis not present

## 2018-04-03 ENCOUNTER — Encounter: Payer: Self-pay | Admitting: Family Medicine

## 2018-04-03 ENCOUNTER — Ambulatory Visit: Payer: Medicare Other | Admitting: Family Medicine

## 2018-04-03 ENCOUNTER — Ambulatory Visit (INDEPENDENT_AMBULATORY_CARE_PROVIDER_SITE_OTHER): Payer: Medicare Other | Admitting: Family Medicine

## 2018-04-03 ENCOUNTER — Other Ambulatory Visit: Payer: Self-pay

## 2018-04-03 VITALS — BP 110/66 | HR 72 | Temp 97.6°F | Resp 16 | Ht 77.0 in | Wt 176.0 lb

## 2018-04-03 DIAGNOSIS — R809 Proteinuria, unspecified: Secondary | ICD-10-CM

## 2018-04-03 DIAGNOSIS — N1330 Unspecified hydronephrosis: Secondary | ICD-10-CM | POA: Insufficient documentation

## 2018-04-03 DIAGNOSIS — R739 Hyperglycemia, unspecified: Secondary | ICD-10-CM

## 2018-04-03 DIAGNOSIS — E785 Hyperlipidemia, unspecified: Secondary | ICD-10-CM

## 2018-04-03 DIAGNOSIS — Z720 Tobacco use: Secondary | ICD-10-CM

## 2018-04-03 DIAGNOSIS — K5909 Other constipation: Secondary | ICD-10-CM | POA: Diagnosis not present

## 2018-04-03 DIAGNOSIS — N200 Calculus of kidney: Secondary | ICD-10-CM

## 2018-04-03 DIAGNOSIS — F258 Other schizoaffective disorders: Secondary | ICD-10-CM

## 2018-04-03 DIAGNOSIS — K219 Gastro-esophageal reflux disease without esophagitis: Secondary | ICD-10-CM

## 2018-04-03 MED ORDER — OMEPRAZOLE 40 MG PO CPDR: DELAYED_RELEASE_CAPSULE | ORAL | 5 refills | Status: DC

## 2018-04-03 MED ORDER — LISINOPRIL 10 MG PO TABS: 10.0000 mg | ORAL_TABLET | Freq: Every day | ORAL | 5 refills | Status: DC

## 2018-04-03 MED ORDER — SIMVASTATIN 10 MG PO TABS: 10.0000 mg | ORAL_TABLET | Freq: Every day | ORAL | 12 refills | Status: DC

## 2018-04-03 NOTE — Progress Notes (Signed)
Name: Christopher Valdez.   MRN: [id number]    DOB: 15-Mar-1960   Date:04/03/2018       Progress Note  Subjective  Chief Complaint  Chief Complaint  Patient presents with  . Medication Refill    Worried about her mom and dad-going through health issues  . Schizophrenia  . Osteoarthritis  . Gastroesophageal Reflux  . Constipation  . Dyslipidemia  . Hyperglycemia    HPI    He lives at [facility], he drove by himself to the office visit today, he is now working at [employer], however because of COVID-19 he is currently at home   Anemia: normal colonoscopy in 2014, HCT stable, hemoccult stools done end of 2019 were negative   Dyslipidemia: he takes Simvastatin daily and denies side effects.No chest pain or decrease in exercise tolerance.no myalgias  GERD: he has been taking Omeprazole, denies heartburn, abdominal painor regurgitation recently , discussed long term use of PPI, he is not sure if he wants to stop medication. He occasionally has symptoms when he over eats  Schizoaffective Disorder: he lives in a group home, has been stable on medications. Able to drive and work. s. He sees Therapist, sports - goes to [facility] -Dr. Mervyn Skeeters., also sees a therapist. He denies side effects of medication.  Constipation: he has a bowel movement daily and sometimes twice daily but denies loose, taking Fibercon with good control of symptoms. No abdominal pain, no recent pain during bowel movements. Unchnaged, no blood in stools   Hyperglycemia: Hedenies polyphagia, polyuria or polydipsia. Last hgbA1C5.7% 09/2017. Recheck labs at this time  Proteinuria: back to normal when checked in March 2016, on ACE, it had normalized but went to 33 back October 2018, he is still on lisinopril, seen by Dr. Thedore Mins, had US kidney that showed right kidney hydronephrosis and also stone, but he wants to hold off on referral to urologist since he is currently not having any symptoms.    Patient  Active Problem List   Diagnosis Date Noted  . Avitaminosis D 10/29/2014  . Enlarged prostate 10/29/2014  . Other schizoaffective disorders (HCC) 10/10/2014  . Proteinuria 10/10/2014  . Hyperglycemia 10/10/2014  . Dyslipidemia 10/10/2014  . History of kidney stones 10/10/2014  . External hemorrhoid 10/10/2014  . Chronic constipation 10/10/2014  . Tobacco use 10/10/2014  . GERD without esophagitis 10/10/2014    No past surgical history on file.  Family History  Problem Relation Age of Onset  . Parkinson's disease Mother   . Deep vein thrombosis Father   . Osteoarthritis Father     Social History   Socioeconomic History  . Marital status: Single    Spouse name: Not on file  . Number of children: 0  . Years of education: Not on file  . Highest education level: High school graduate  Occupational History  . Occupation: [employer]  Social Needs  . Financial resource strain: Not very hard  . Food insecurity:    Worry: Never true    Inability: Never true  . Transportation needs:    Medical: No    Non-medical: No  Tobacco Use  . Smoking status: Current Every Day Smoker    Packs/day: 1.00    Years: 40.00    Pack years: 40.00    Types: Cigarettes  . Smokeless tobacco: Never Used  Substance and Sexual Activity  . Alcohol use: No    Alcohol/week: 0.0 standard drinks  . Drug use: No  . Sexual  activity: Never  Lifestyle  . Physical activity:    Days per week: 0 days    Minutes per session: 0 min  . Stress: Only a little  Relationships  . Social connections:    Talks on phone: More than three times a week    Gets together: Twice a week    Attends religious service: 1 to 4 times per year    Active member of club or organization: No    Attends meetings of clubs or organizations: Never    Relationship status: Never married  . Intimate partner violence:    Fear of current or ex partner: No    Emotionally abused: No    Physically abused: No    Forced sexual activity: No   Other Topics Concern  . Not on file  Social History Narrative   He lives in a group home, but able to drive      Current Outpatient Medications:  .  bismuth subsalicylate (PEPTO BISMOL) 262 MG chewable tablet, Chew 524 mg by mouth as needed., Disp: , Rfl:  .  Cholecalciferol (VITAMIN D3) 50 MCG (2000 UT) capsule, Take 1 capsule (2,000 Units total) by mouth daily., Disp: 30 capsule, Rfl: 5 .  cloZAPine (CLOZARIL) 100 MG tablet, Take 100 mg by mouth daily. 2 tablets in the morning and 3 and half at bedtime, Disp: , Rfl:  .  divalproex (DEPAKOTE) 500 MG DR tablet, Take 500 mg by mouth at bedtime. Extended Release , Disp: , Rfl:  .  FLUoxetine (PROZAC) 40 MG capsule, Take 40 mg by mouth daily., Disp: , Rfl:  .  lisinopril (PRINIVIL,ZESTRIL) 10 MG tablet, Take 1 tablet (10 mg total) by mouth daily., Disp: 30 tablet, Rfl: 5 .  omeprazole (PRILOSEC) 40 MG capsule, TAKE ONE CAPSULE BY MOUTH EVERY MORNING FOR REFLUX, Disp: 30 capsule, Rfl: 5 .  polycarbophil (FIBERCON) 625 MG tablet, Take 625 mg by mouth daily., Disp: , Rfl:  .  simvastatin (ZOCOR) 10 MG tablet, Take 1 tablet (10 mg total) by mouth daily at 6 PM., Disp: 30 tablet, Rfl: 12  No Known Allergies  I personally reviewed active problem list, medication list, allergies, family history, social history with the patient/caregiver today.   ROS  Constitutional: Negative for fever or weight change.  Respiratory: Negative for cough and shortness of breath.   Cardiovascular: Negative for chest pain or palpitations.  Gastrointestinal: Negative for abdominal pain, no bowel changes.  Musculoskeletal: Negative for gait problem or joint swelling.  Skin: Negative for rash.  Neurological: Negative for dizziness or headache.  No other specific complaints in a complete review of systems (except as listed in HPI above).  Objective  Vitals:   04/03/18 1027  BP: 110/66  Pulse: 72  Resp: 16  Temp: 97.6 F (36.4 C)  TempSrc: Oral  SpO2: 95%   Weight: 176 lb (79.8 kg)  Height: 6\' 5"  (1.956 m)    Body mass index is 20.87 kg/m.  Physical Exam  Constitutional: Patient appears well-developed and well-nourished.No distress.  HEENT: head atraumatic, normocephalic, pupils equal and reactive to light, neck supple, throat within normal limits Cardiovascular: Normal rate, regular rhythm and normal heart sounds.  No murmur heard. No BLE edema. Pulmonary/Chest: Effort normal and breath sounds normal. No respiratory distress. Abdominal: Soft.  There is no tenderness. Psychiatric: Patient has a normal mood and affect. behavior is normal.  PHQ2/9: Depression screen [facility] 2/9 04/03/2018 11/14/2017 11/07/2017 10/12/2017 10/04/2017  Decreased Interest 0 0 0 0 0  Down, Depressed, Hopeless 0 0 0 0 0  PHQ - 2 Score 0 0 0 0 0  Altered sleeping 0 1 0 0 0  Tired, decreased energy 1 0 0 0 0  Change in appetite 0 0 0 0 0  Feeling bad or failure about yourself  0 0 0 0 0  Trouble concentrating 1 0 0 - 1  Moving slowly or fidgety/restless 0 0 0 2 2  Suicidal thoughts 0 0 0 0 0  PHQ-9 Score 2 1 0 2 3  Difficult doing work/chores Not difficult at all Not difficult at all Not difficult at all Not difficult at all Not difficult at all   phq 9 negative   Fall Risk: Fall Risk  04/03/2018 11/14/2017 11/07/2017 10/04/2017 04/04/2017  Falls in the past year? 0 0 No No No  Number falls in past yr: 0 - - - -  Injury with Fall? 0 - - - -     Functional Status Survey: Is the patient deaf or have difficulty hearing?: No Does the patient have difficulty seeing, even when wearing glasses/contacts?: Yes Does the patient have difficulty concentrating, remembering, or making decisions?: Yes Does the patient have difficulty walking or climbing stairs?: No Does the patient have difficulty dressing or bathing?: No Does the patient have difficulty doing errands alone such as visiting a doctor's office or shopping?: No    Assessment & Plan  1. GERD without  esophagitis  - omeprazole (PRILOSEC) 40 MG capsule; TAKE ONE CAPSULE BY MOUTH EVERY MORNING FOR REFLUX  Dispense: 30 capsule; Refill: 5  2. Tobacco use  Not ready to quit yet   3. Chronic constipation  Stable   4. Hyperglycemia  Last hgbA1C was at goal   5. Dyslipidemia  - simvastatin (ZOCOR) 10 MG tablet; Take 1 tablet (10 mg total) by mouth daily at 6 PM.  Dispense: 30 tablet; Refill: 12  6. Microalbuminuria  - lisinopril (PRINIVIL,ZESTRIL) 10 MG tablet; Take 1 tablet (10 mg total) by mouth daily.  Dispense: 30 tablet; Refill: 5  7. Other schizoaffective disorders (HCC)  Keep follow up with psychiatrist  8. Hydronephrosis of right kidney  US ordered by Dr. Thedore Mins   9. Right kidney stone  He wants to hold off on seeing Urologist at this time, no symptoms

## 2018-07-16 ENCOUNTER — Other Ambulatory Visit: Payer: Self-pay

## 2018-07-16 MED ORDER — VITAMIN D3 50 MCG (2000 UT) PO CAPS: 2000.0000 [IU] | ORAL_CAPSULE | Freq: Every day | ORAL | 5 refills | Status: DC

## 2018-07-18 ENCOUNTER — Ambulatory Visit: Payer: Medicare Other

## 2018-07-18 DIAGNOSIS — Z79899 Other long term (current) drug therapy: Secondary | ICD-10-CM | POA: Diagnosis not present

## 2018-07-25 ENCOUNTER — Ambulatory Visit
Admission: RE | Admit: 2018-07-25 | Discharge: 2018-07-25 | Disposition: A | Discharge status: Home / Self Care | Payer: Medicare Other | Source: Ambulatory Visit | Attending: Nephrology | Admitting: Nephrology

## 2018-07-25 ENCOUNTER — Other Ambulatory Visit: Payer: Self-pay

## 2018-07-25 DIAGNOSIS — R809 Proteinuria, unspecified: Secondary | ICD-10-CM | POA: Insufficient documentation

## 2018-07-25 DIAGNOSIS — N200 Calculus of kidney: Secondary | ICD-10-CM | POA: Diagnosis not present

## 2018-07-25 DIAGNOSIS — R319 Hematuria, unspecified: Secondary | ICD-10-CM | POA: Insufficient documentation

## 2018-07-31 DIAGNOSIS — R809 Proteinuria, unspecified: Secondary | ICD-10-CM | POA: Diagnosis not present

## 2018-07-31 DIAGNOSIS — N200 Calculus of kidney: Secondary | ICD-10-CM | POA: Diagnosis not present

## 2018-08-01 ENCOUNTER — Encounter: Payer: Self-pay | Admitting: Family Medicine

## 2018-08-15 DIAGNOSIS — Z79899 Other long term (current) drug therapy: Secondary | ICD-10-CM | POA: Diagnosis not present

## 2018-10-09 ENCOUNTER — Other Ambulatory Visit: Payer: Self-pay

## 2018-10-09 ENCOUNTER — Encounter: Payer: Self-pay | Admitting: Family Medicine

## 2018-10-09 ENCOUNTER — Ambulatory Visit (INDEPENDENT_AMBULATORY_CARE_PROVIDER_SITE_OTHER): Payer: Medicare Other | Admitting: Family Medicine

## 2018-10-09 VITALS — BP 90/50 | HR 100 | Temp 98.2°F | Resp 16 | Ht 77.0 in | Wt 173.2 lb

## 2018-10-09 DIAGNOSIS — K219 Gastro-esophageal reflux disease without esophagitis: Secondary | ICD-10-CM | POA: Diagnosis not present

## 2018-10-09 DIAGNOSIS — R809 Proteinuria, unspecified: Secondary | ICD-10-CM

## 2018-10-09 DIAGNOSIS — J410 Simple chronic bronchitis: Secondary | ICD-10-CM

## 2018-10-09 DIAGNOSIS — D638 Anemia in other chronic diseases classified elsewhere: Secondary | ICD-10-CM

## 2018-10-09 DIAGNOSIS — E785 Hyperlipidemia, unspecified: Secondary | ICD-10-CM

## 2018-10-09 DIAGNOSIS — Z23 Encounter for immunization: Secondary | ICD-10-CM

## 2018-10-09 DIAGNOSIS — R739 Hyperglycemia, unspecified: Secondary | ICD-10-CM

## 2018-10-09 DIAGNOSIS — F258 Other schizoaffective disorders: Secondary | ICD-10-CM

## 2018-10-09 DIAGNOSIS — Z720 Tobacco use: Secondary | ICD-10-CM

## 2018-10-09 MED ORDER — LISINOPRIL 10 MG PO TABS: 10.0000 mg | ORAL_TABLET | Freq: Every day | ORAL | 5 refills | Status: DC

## 2018-10-09 MED ORDER — OMEPRAZOLE 40 MG PO CPDR: DELAYED_RELEASE_CAPSULE | ORAL | 5 refills | Status: DC

## 2018-10-09 MED ORDER — SIMVASTATIN 10 MG PO TABS: 10.0000 mg | ORAL_TABLET | Freq: Every day | ORAL | 12 refills | Status: DC

## 2018-10-09 NOTE — Progress Notes (Signed)
Name: Christopher AmourJohn J Willhoite Jr.   MRN: [id number]    DOB: 02/19/1960   Date:10/09/2018       Progress Note  Subjective  Chief Complaint  Chief Complaint  Patient presents with  . Hyperglycemia  . Dyslipidemia  . Gastroesophageal Reflux  . Anemia    HPI  Christopher Valdez lives at [facility], Christopher Valdez drove by himself to the office visit today,Christopher Valdez is now working at [employer] pre[employer]. Christopher Valdez states [facility]dez died in June from Parkinson's but doing okay    Anemia: normal colonoscopy in 2014, HCT stable, hemoccult stools done end of 2019 were negative , we will recheck level today  Dyslipidemia: Christopher Valdez takes Simvastatin daily and denies side effects. No chest pain or decrease in exercise tolerance.no myalgias. We will repeat labs today   GERD: Christopher Valdez has been taking Omeprazole, denies heartburn, abdominal painor regurgitation recently , discussed long term use of PPI, Christopher Valdez is not sure if Christopher Valdez wants to stop medication.Christopher Valdez occasionally has symptoms when Christopher Valdez over eats, but very seldom now   Schizoaffective Disorder: Christopher Valdez lives in a group Valdez, has been stable on medications. Able to drive and work. Christopher Valdez sees Therapist, sportssychiatrist - goes to [facility] -not sure of the name ( changed recently) , also sees a therapist. Christopher Valdez denies side effects of medication.  Constipation: Christopher Valdez has a bowel movement daily and sometimes twice daily but denies loose, taking Fibercon with good control of symptoms. No abdominal pain, no recent pain during bowel movements. Stable  Hyperglycemia: Hedenies polyphagia, polyuria or polydipsia. Last hgbA1C5.7% 09/2017. Recheck labs today   Proteinuria: it was high in  March 2016, Christopher Valdez was placed on ACE,and it normalized but went to 33 back October 2018 and up to 53 09/2017 [facility] was seen by Dr. Thedore MinsSingh, had US kidney that showed right kidney hydronephrosis and also stone, but Christopher Valdez wants to hold off on referral to urologist since Christopher Valdez is currently not having any symptoms. BP is low but taking for kidney protection. We will  recheck labs today    Patient Active Problem List   Diagnosis Date Noted  . Hydronephrosis of right kidney 04/03/2018  . Right kidney stone 04/03/2018  . Avitaminosis D 10/29/2014  . Enlarged prostate 10/29/2014  . Other schizoaffective disorders (HCC) 10/10/2014  . Proteinuria 10/10/2014  . Hyperglycemia 10/10/2014  . Dyslipidemia 10/10/2014  . History of kidney stones 10/10/2014  . External hemorrhoid 10/10/2014  . Chronic constipation 10/10/2014  . Tobacco use 10/10/2014  . GERD without esophagitis 10/10/2014    History reviewed. No pertinent surgical history.  Family History  Problem Relation Age of Onset  . Parkinson's disease [facility]   . Deep vein thrombosis Father   . Osteoarthritis Father     Social History   Socioeconomic History  . Marital status: Single    Spouse name: Not on file  . Number of children: 0  . Years of education: Not on file  . Highest education level: High school graduate  Occupational History  . Occupation: [employer]  Social Needs  . Financial resource strain: Not very hard  . Food insecurity    Worry: Never true    Inability: Never true  . Transportation needs    Medical: No    Non-medical: No  Tobacco Use  . Smoking status: Current Every Day Smoker    Packs/day: 1.00    Years: 40.00    Pack years: 40.00    Types: Cigarettes  . Smokeless tobacco: Never Used  Substance  and Sexual Activity  . Alcohol use: No    Alcohol/week: 0.0 standard drinks  . Drug use: No  . Sexual activity: Never  Lifestyle  . Physical activity    Days per week: 0 days    Minutes per session: 0 min  . Stress: Only a little  Relationships  . Social connections    Talks on phone: More than three times a week    Gets together: Twice a week    Attends religious service: 1 to 4 times per year    Active member of club or organization: No    Attends meetings of clubs or organizations: Never    Relationship status: Never married  . Intimate partner violence     Fear of current or ex partner: No    Emotionally abused: No    Physically abused: No    Forced sexual activity: No  Other Topics Concern  . Not on file  Social History Narrative   Christopher Valdez lives in a group Valdez, but able to drive      Current Outpatient Medications:  .  bismuth subsalicylate (PEPTO BISMOL) 262 MG chewable tablet, Chew 524 mg by mouth as needed., Disp: , Rfl:  .  Cholecalciferol (VITAMIN D3) 50 MCG (2000 UT) capsule, Take 1 capsule (2,000 Units total) by mouth daily., Disp: 30 capsule, Rfl: 5 .  cloZAPine (CLOZARIL) 100 MG tablet, Take 100 mg by mouth daily. 2 tablets in the morning and 3 and half at bedtime, Disp: , Rfl:  .  divalproex (DEPAKOTE) 500 MG DR tablet, Take 500 mg by mouth at bedtime. Extended Release , Disp: , Rfl:  .  FLUoxetine (PROZAC) 40 MG capsule, Take 40 mg by mouth daily., Disp: , Rfl:  .  lisinopril (PRINIVIL,ZESTRIL) 10 MG tablet, Take 1 tablet (10 mg total) by mouth daily., Disp: 30 tablet, Rfl: 5 .  omeprazole (PRILOSEC) 40 MG capsule, TAKE ONE CAPSULE BY MOUTH EVERY MORNING FOR REFLUX, Disp: 30 capsule, Rfl: 5 .  polycarbophil (FIBERCON) 625 MG tablet, Take 625 mg by mouth daily., Disp: , Rfl:  .  simvastatin (ZOCOR) 10 MG tablet, Take 1 tablet (10 mg total) by mouth daily at 6 PM., Disp: 30 tablet, Rfl: 12  No Known Allergies  I personally reviewed active problem list, medication list, allergies, family history, social history, health maintenance with the patient/caregiver today.   ROS  Constitutional: Negative for fever or weight change.  Respiratory: Positive for intermittent cough but no shortness of breath.   Cardiovascular: Negative for chest pain or palpitations.  Gastrointestinal: Negative for abdominal pain, no bowel changes.  Musculoskeletal: Negative for gait problem or joint swelling.  Skin: Negative for rash.  Neurological: Negative for dizziness or headache.  No other specific complaints in a complete review of systems (except  as listed in HPI above).  Objective  Vitals:   10/09/18 1323  BP: (!) 90/50  Pulse: 100  Resp: 16  Temp: 98.2 F (36.8 C)  TempSrc: Temporal  SpO2: 96%  Weight: 173 lb 3.2 oz (78.6 kg)  Height: 6\' 5"  (1.956 m)    Body mass index is 20.54 kg/m.  Physical Exam  Constitutional: Patient appears well-developed and well-nourished.No distress.  HEENT: head atraumatic, normocephalic, pupils equal and reactive to light Cardiovascular: Normal rate, regular rhythm and normal heart sounds.  No murmur heard. No BLE edema. Pulmonary/Chest: Effort normal and breath sounds normal. No respiratory distress. Abdominal: Soft.  There is no tenderness.negative CVA  Psychiatric: Patient has a normal  mood and affect. behavior is normal.  Cooperative   PHQ2/9: Depression screen [facility] 2/9 10/09/2018 04/03/2018 11/14/2017 11/07/2017 10/12/2017  Decreased Interest 0 0 0 0 0  Down, Depressed, Hopeless 0 0 0 0 0  PHQ - 2 Score 0 0 0 0 0  Altered sleeping 0 0 1 0 0  Tired, decreased energy 0 1 0 0 0  Change in appetite 0 0 0 0 0  Feeling bad or failure about yourself  0 0 0 0 0  Trouble concentrating 0 1 0 0 -  Moving slowly or fidgety/restless 0 0 0 0 2  Suicidal thoughts 0 0 0 0 0  PHQ-9 Score 0 2 1 0 2  Difficult doing work/chores - Not difficult at all Not difficult at all Not difficult at all Not difficult at all    phq 9 is negative   Fall Risk: Fall Risk  10/09/2018 04/03/2018 11/14/2017 11/07/2017 10/04/2017  Falls in the past year? 0 0 0 No No  Number falls in past yr: 0 0 - - -  Injury with Fall? 0 0 - - -     Functional Status Survey: Is the patient deaf or have difficulty hearing?: No Does the patient have difficulty seeing, even when wearing glasses/contacts?: No Does the patient have difficulty concentrating, remembering, or making decisions?: No Does the patient have difficulty walking or climbing stairs?: No Does the patient have difficulty dressing or bathing?: No Does the  patient have difficulty doing errands alone such as visiting a doctor's office or shopping?: No   Assessment & Plan  1. Dyslipidemia  - Lipid panel  2. Need for immunization against influenza  - Flu Vaccine QUAD 36+ mos IM  3. GERD without esophagitis  Continue medication   4. Anemia of chronic disease  - CBC with Differential/Platelet  5. Other schizoaffective disorders (HCC)  Stable, under the care of psychiatrist   6. Microalbuminuria  - Microalbumin / creatinine urine ratio  7. Hyperglycemia  - COMPLETE METABOLIC PANEL WITH GFR - Hemoglobin A1c  8. Tobacco use  Not ready to quit   9. Smokers' cough (HCC)  Christopher [facility]dez does not want medication , no SOB

## 2018-10-10 LAB — CBC WITH DIFFERENTIAL/PLATELET
Absolute Monocytes: 640 cells/uL (ref 200–950)
Basophils Absolute: 58 cells/uL (ref 0–200)
Basophils Relative: 0.9 %
Eosinophils Absolute: 128 cells/uL (ref 15–500)
Eosinophils Relative: 2 %
HCT: 38.5 % (ref 38.5–50.0)
Hemoglobin: 12.5 g/dL — ABNORMAL LOW (ref 13.2–17.1)
Lymphs Abs: 1498 cells/uL (ref 850–3900)
MCH: 29.6 pg (ref 27.0–33.0)
MCHC: 32.5 g/dL (ref 32.0–36.0)
MCV: 91 fL (ref 80.0–100.0)
MPV: 10.5 fL (ref 7.5–12.5)
Monocytes Relative: 10 %
Neutro Abs: 4077 cells/uL (ref 1500–7800)
Neutrophils Relative %: 63.7 %
Platelets: 286 10*3/uL (ref 140–400)
RBC: 4.23 10*6/uL (ref 4.20–5.80)
RDW: 13.6 % (ref 11.0–15.0)
Total Lymphocyte: 23.4 %
WBC: 6.4 10*3/uL (ref 3.8–10.8)

## 2018-10-10 LAB — MICROALBUMIN / CREATININE URINE RATIO
Creatinine, Urine: 270 mg/dL (ref 20–320)
Microalb Creat Ratio: 74 mcg/mg creat — ABNORMAL HIGH (ref <30)
Microalb, Ur: 20.1 mg/dL

## 2018-10-10 LAB — LIPID PANEL
Cholesterol: 186 mg/dL (ref <200)
HDL: 47 mg/dL (ref >=40)
LDL Cholesterol (Calc): 116 mg/dL (calc) — ABNORMAL HIGH
Non-HDL Cholesterol (Calc): 139 mg/dL (calc) — ABNORMAL HIGH (ref <130)
Total CHOL/HDL Ratio: 4 (calc) (ref <5.0)
Triglycerides: 120 mg/dL (ref <150)

## 2018-10-10 LAB — COMPLETE METABOLIC PANEL WITH GFR
AG Ratio: 2.3 (calc) (ref 1.0–2.5)
ALT: 15 U/L (ref 9–46)
AST: 19 U/L (ref 10–35)
Albumin: 4.6 g/dL (ref 3.6–5.1)
Alkaline phosphatase (APISO): 101 U/L (ref 35–144)
BUN: 20 mg/dL (ref 7–25)
CO2: 24 mmol/L (ref 20–32)
Calcium: 9.6 mg/dL (ref 8.6–10.3)
Chloride: 105 mmol/L (ref 98–110)
Creat: 1.14 mg/dL (ref 0.70–1.33)
GFR, Est African American: 81 mL/min/{1.73_m2} (ref >=60)
GFR, Est Non African American: 70 mL/min/{1.73_m2} (ref >=60)
Globulin: 2 g/dL (calc) (ref 1.9–3.7)
Glucose, Bld: 93 mg/dL (ref 65–99)
Potassium: 4.4 mmol/L (ref 3.5–5.3)
Sodium: 140 mmol/L (ref 135–146)
Total Bilirubin: 0.4 mg/dL (ref 0.2–1.2)
Total Protein: 6.6 g/dL (ref 6.1–8.1)

## 2018-10-10 LAB — HEMOGLOBIN A1C
Hgb A1c MFr Bld: 5.4 % of total Hgb (ref <5.7)
Mean Plasma Glucose: 108 (calc)
eAG (mmol/L): 6 (calc)

## 2018-12-31 ENCOUNTER — Other Ambulatory Visit: Payer: Self-pay | Admitting: Family Medicine

## 2019-01-02 ENCOUNTER — Other Ambulatory Visit: Payer: Self-pay | Admitting: Family Medicine

## 2019-01-16 DIAGNOSIS — R05 Cough: Secondary | ICD-10-CM | POA: Diagnosis not present

## 2019-01-16 DIAGNOSIS — Z79899 Other long term (current) drug therapy: Secondary | ICD-10-CM | POA: Diagnosis not present

## 2019-01-23 DIAGNOSIS — R801 Persistent proteinuria, unspecified: Secondary | ICD-10-CM | POA: Diagnosis not present

## 2019-01-23 DIAGNOSIS — N200 Calculus of kidney: Secondary | ICD-10-CM | POA: Diagnosis not present

## 2019-02-13 DIAGNOSIS — Z79899 Other long term (current) drug therapy: Secondary | ICD-10-CM | POA: Diagnosis not present

## 2019-03-06 DIAGNOSIS — J310 Chronic rhinitis: Secondary | ICD-10-CM | POA: Diagnosis not present

## 2019-03-13 DIAGNOSIS — Z79899 Other long term (current) drug therapy: Secondary | ICD-10-CM | POA: Diagnosis not present

## 2019-03-20 DIAGNOSIS — J310 Chronic rhinitis: Secondary | ICD-10-CM | POA: Diagnosis not present

## 2019-04-03 DIAGNOSIS — Z79899 Other long term (current) drug therapy: Secondary | ICD-10-CM | POA: Diagnosis not present

## 2019-04-09 ENCOUNTER — Other Ambulatory Visit: Payer: Self-pay

## 2019-04-09 ENCOUNTER — Ambulatory Visit (INDEPENDENT_AMBULATORY_CARE_PROVIDER_SITE_OTHER): Payer: Medicare Other

## 2019-04-09 ENCOUNTER — Ambulatory Visit (INDEPENDENT_AMBULATORY_CARE_PROVIDER_SITE_OTHER): Payer: Medicare Other | Admitting: Family Medicine

## 2019-04-09 ENCOUNTER — Encounter: Payer: Self-pay | Admitting: Family Medicine

## 2019-04-09 VITALS — BP 90/44 | HR 105 | Temp 97.3°F | Resp 16 | Ht 77.0 in | Wt 175.0 lb

## 2019-04-09 VITALS — BP 90/46 | HR 93 | Temp 97.3°F | Resp 16 | Ht 77.0 in | Wt 175.0 lb

## 2019-04-09 DIAGNOSIS — J410 Simple chronic bronchitis: Secondary | ICD-10-CM | POA: Diagnosis not present

## 2019-04-09 DIAGNOSIS — R809 Proteinuria, unspecified: Secondary | ICD-10-CM | POA: Diagnosis not present

## 2019-04-09 DIAGNOSIS — N400 Enlarged prostate without lower urinary tract symptoms: Secondary | ICD-10-CM

## 2019-04-09 DIAGNOSIS — E785 Hyperlipidemia, unspecified: Secondary | ICD-10-CM

## 2019-04-09 DIAGNOSIS — K219 Gastro-esophageal reflux disease without esophagitis: Secondary | ICD-10-CM

## 2019-04-09 DIAGNOSIS — N1330 Unspecified hydronephrosis: Secondary | ICD-10-CM

## 2019-04-09 DIAGNOSIS — N200 Calculus of kidney: Secondary | ICD-10-CM

## 2019-04-09 DIAGNOSIS — Z0000 Encounter for general adult medical examination without abnormal findings: Secondary | ICD-10-CM | POA: Diagnosis not present

## 2019-04-09 DIAGNOSIS — D638 Anemia in other chronic diseases classified elsewhere: Secondary | ICD-10-CM

## 2019-04-09 DIAGNOSIS — E559 Vitamin D deficiency, unspecified: Secondary | ICD-10-CM

## 2019-04-09 DIAGNOSIS — F258 Other schizoaffective disorders: Secondary | ICD-10-CM | POA: Diagnosis not present

## 2019-04-09 MED ORDER — OMEPRAZOLE 40 MG PO CPDR: DELAYED_RELEASE_CAPSULE | ORAL | 5 refills | Status: DC

## 2019-04-09 MED ORDER — LISINOPRIL 5 MG PO TABS: 5.0000 mg | ORAL_TABLET | Freq: Every day | ORAL | 5 refills | Status: DC

## 2019-04-09 NOTE — Progress Notes (Signed)
Subjective:   Christopher Valdez. is a 59 y.o. male who presents for Medicare Annual/Subsequent preventive examination.  Review of Systems:   Cardiac Risk Factors include: advanced age (>43men, >53 women)     Objective:    Vitals: BP (!) 90/46 (BP Location: Left Arm, Patient Position: Sitting, Cuff Size: Normal)   Pulse 93   Temp (!) 97.3 F (36.3 C) (Temporal)   Resp 16   Ht 6\' 5"  (1.956 m)   Wt 175 lb (79.4 kg)   SpO2 95%   BMI 20.75 kg/m   Body mass index is 20.75 kg/m.  Advanced Directives 04/09/2019 10/14/2015 05/20/2015 04/14/2015 10/10/2014  Does Patient Have a Medical Advance Directive? No No No No No  Would patient like information on creating a medical advance directive? No - Patient declined No - patient declined information No - patient declined information - No - patient declined information    Tobacco Social History   Tobacco Use  Smoking Status Current Every Day Smoker  . Packs/day: 1.00  . Years: 40.00  . Pack years: 40.00  . Types: Cigarettes  Smokeless Tobacco Never Used     Ready to quit: Not Answered Counseling given: Not Answered   Clinical Intake:  Pre-visit preparation completed: Yes  Pain Score: 5  Pain Type: Acute pain Pain Location: Back Pain Orientation: Lower Pain Descriptors / Indicators: Discomfort Pain Onset: More than a month ago Pain Frequency: Intermittent     BMI - recorded: 20.75 Nutritional Status: BMI of 19-24  Normal Diabetes: No  How often do you need to have someone help you when you read instructions, pamphlets, or other written materials from your doctor or pharmacy?: 1 - Never  Interpreter Needed?: No  Information entered by :: [phone] LPN  Past Medical History:  Diagnosis Date  . Calculus of kidney   . Chronic constipation   . Dyslipidemia   . GERD (gastroesophageal reflux disease)   . Proteinuria   . Schizo-affective schizophrenia (HCC)   . Vitamin D deficiency    Past Surgical History:    Procedure Laterality Date  . COLONOSCOPY  07/02/2012   Family History  Problem Relation Age of Onset  . Parkinson's disease Mother   . Deep vein thrombosis Father   . Osteoarthritis Father    Social History   Socioeconomic History  . Marital status: Single    Spouse name: Not on file  . Number of children: 0  . Years of education: Not on file  . Highest education level: High school graduate  Occupational History  . Occupation: [employer]  Tobacco Use  . Smoking status: Current Every Day Smoker    Packs/day: 1.00    Years: 40.00    Pack years: 40.00    Types: Cigarettes  . Smokeless tobacco: Never Used  Substance and Sexual Activity  . Alcohol use: No    Alcohol/week: 0.0 standard drinks  . Drug use: No  . Sexual activity: Never  Other Topics Concern  . Not on file  Social History Narrative   He lives in a group home, but able to drive and works one day per week at 07/04/2012   Social Determinants of Health   Financial Resource Strain: Low Risk   . Difficulty of Paying Living Expenses: Not very hard  Food Insecurity:   . Worried About [employer] in the Last Year:   . Programme researcher, broadcasting/film/video in the Last Year:   Transportation Needs: No  Transportation Needs  . Lack of Transportation (Medical): No  . Lack of Transportation (Non-Medical): No  Physical Activity: Inactive  . Days of Exercise per Week: 0 days  . Minutes of Exercise per Session: 0 min  Stress: No Stress Concern Present  . Feeling of Stress : Only a little  Social Connections: Moderately Isolated  . Frequency of Communication with Friends and Family: More than three times a week  . Frequency of Social Gatherings with Friends and Family: Twice a week  . Attends Religious Services: Never  . Active Member of Clubs or Organizations: No  . Attends Banker Meetings: Never  . Marital Status: Never married    Outpatient Encounter Medications as of 04/09/2019  Medication Sig  . bismuth subsalicylate  (PEPTO BISMOL) 262 MG chewable tablet Chew 524 mg by mouth as needed.  . Cholecalciferol (VITAMIN D3) 50 MCG (2000 UT) capsule TAKE 1 CAPSULE BY MOUTH DAILY  . cloZAPine (CLOZARIL) 100 MG tablet Take 100 mg by mouth daily. 2 tablets in the morning and 3 and half at bedtime  . divalproex (DEPAKOTE) 500 MG DR tablet Take 500 mg by mouth at bedtime. Extended Release   . FLUoxetine (PROZAC) 40 MG capsule Take 40 mg by mouth daily.  . polycarbophil (FIBERCON) 625 MG tablet Take 625 mg by mouth daily.  . simvastatin (ZOCOR) 10 MG tablet Take 1 tablet (10 mg total) by mouth daily at 6 PM.  . [DISCONTINUED] lisinopril (ZESTRIL) 10 MG tablet Take 1 tablet (10 mg total) by mouth daily.  . [DISCONTINUED] omeprazole (PRILOSEC) 40 MG capsule TAKE ONE CAPSULE BY MOUTH EVERY MORNING FOR REFLUX   No facility-administered encounter medications on file as of 04/09/2019.    Activities of Daily Living In your present state of health, do you have any difficulty performing the following activities: 04/09/2019 04/09/2019  Hearing? N N  Comment declines hearing aids -  Vision? N N  Difficulty concentrating or making decisions? N N  Walking or climbing stairs? N N  Dressing or bathing? N N  Doing errands, shopping? N N  Preparing Food and eating ? N -  Using the Toilet? N -  In the past six months, have you accidently leaked urine? N -  Do you have problems with loss of bowel control? N -  Managing your Medications? N -  Managing your Finances? N -  Housekeeping or managing your Housekeeping? N -  Some recent data might be hidden    Patient Care Team: Alba Cory, MD as PCP - General (Family Medicine) Charlynn Grimes, MD as Consulting Physician (Psychiatry)   Assessment:   This is a routine wellness examination for Christopher Valdez.  Exercise Activities and Dietary recommendations Current Exercise Habits: The patient does not participate in regular exercise at present, Exercise limited by: None  identified  Goals   None     Fall Risk Fall Risk  04/09/2019 04/09/2019 10/09/2018 04/03/2018 11/14/2017  Falls in the past year? 0 0 0 0 0  Number falls in past yr: 0 0 0 0 -  Injury with Fall? 0 0 0 0 -  Risk for fall due to : No Fall Risks - - - -  Follow up Falls prevention discussed - - - -   FALL RISK PREVENTION PERTAINING TO THE HOME:  Any stairs in or around the home? No  If so, do they handrails? No   Home free of loose throw rugs in walkways, pet beds, electrical cords, etc? Yes  Adequate lighting in your home to reduce risk of falls? Yes   ASSISTIVE DEVICES UTILIZED TO PREVENT FALLS:  Life alert? No  Use of a cane, walker or w/c? No  Grab bars in the bathroom? Yes  Shower chair or bench in shower? No  Elevated toilet seat or a handicapped toilet? No   DME ORDERS:  DME order needed?  No   TIMED UP AND GO:  Was the test performed? Yes .  Length of time to ambulate 10 feet: 5 sec.   GAIT:  Appearance of gait: Gait stead-fast and without the use of an assistive device.   Education: Fall risk prevention has been discussed.  Intervention(s) required? No    Depression Screen PHQ 2/9 Scores 04/09/2019 04/09/2019 10/09/2018 04/03/2018  PHQ - 2 Score 0 0 0 0  PHQ- 9 Score - 0 0 2    Cognitive Function - 6CIT deferred; pt declined        Immunization History  Administered Date(s) Administered  . Influenza Split 11/21/2007, 10/01/2009  . Influenza, Seasonal, Injecte, Preservative Fre 12/06/2011  . Influenza,inj,Quad PF,6+ Mos 10/10/2014, 10/14/2015, 11/02/2016, 10/04/2017, 10/09/2018  . Influenza-Unspecified 09/13/2012  . Pneumococcal Polysaccharide-23 12/06/2011  . Tdap 05/28/2009    Qualifies for Shingles Vaccine? Yes . Due for Shingrix. Education has been provided regarding the importance of this vaccine. Pt has been advised to call insurance company to determine out of pocket expense. Advised may also receive vaccine at local pharmacy or Health Dept.  Verbalized acceptance and understanding.  Tdap: Up to date  Flu Vaccine: Up to date  Pneumococcal Vaccine: Up to date   Screening Tests Health Maintenance  Topic Date Due  . HIV Screening  04/08/2020 (Originally 04/18/1975)  . TETANUS/TDAP  05/29/2019  . COLONOSCOPY  07/03/2022  . INFLUENZA VACCINE  Completed  . Hepatitis C Screening  Completed   Cancer Screenings:  Colorectal Screening: Completed 07/02/12. Repeat every 10 years  Lung Cancer Screening: (Low Dose CT Chest recommended if Age 72-80 years, 30 pack-year currently smoking OR have quit w/in 15years.) does not qualify.   Additional Screening:  Hepatitis C Screening: does qualify; Completed 06/21/12  Vision Screening: Recommended annual ophthalmology exams for early detection of glaucoma and other disorders of the eye. Is the patient up to date with their annual eye exam?  Yes  Who is the provider or what is the name of the office in which the pt attends annual eye exams? Dr. Alvester Morin  Dental Screening: Recommended annual dental exams for proper oral hygiene  Community Resource Referral:  CRR required this visit?  No       Plan:    I have personally reviewed and addressed the Medicare Annual Wellness questionnaire and have noted the following in the patient's chart:  A. Medical and social history B. Use of alcohol, tobacco or illicit drugs  C. Current medications and supplements D. Functional ability and status E.  Nutritional status F.  Physical activity G. Advance directives H. List of other physicians I.  Hospitalizations, surgeries, and ER visits in previous 12 months J.  Vitals K. Screenings such as hearing and vision if needed, cognitive and depression L. Referrals and appointments   In addition, I have reviewed and discussed with patient certain preventive protocols, quality metrics, and best practice recommendations. A written personalized care plan for preventive services as well as general preventive  health recommendations were provided to patient.   Signed,  Reather Littler, LPN Nurse Health Advisor   Nurse Notes: pt  reported 1st covid vaccine 04/04/19 but did not bring documentation for which type.   BP at 1st reading 90/44 with recheck 90/46. Per Dr. Ancil Boozer lisinopril decreased from 10mg  to 5mg  and pt to return in one week for nurse visit BP check.

## 2019-04-09 NOTE — Progress Notes (Signed)
Name: Christopher Valdez.   MRN: [id number]    DOB: Aug 06, 1960   Date:04/09/2019       Progress Note  Subjective  Chief Complaint  Chief Complaint  Patient presents with  . Gastroesophageal Reflux  . Hyperglycemia  . Dyslipidemia    HPI  He lives at [facility], he drove by himself to the office visit today,he is now working at [employer] on Sundays.  He states his mother died in 30-Jun-2022 complications of Parkinson's disease, he recently states his sister stopped by to visit and he was able to face time his father ( he is in a nursing home)   Anemia: normal colonoscopy in 2014,HCT stable, hemoccult stools done end of 2019 were negative, last HCT stable, he has microalbuminuria, likely anemia of chronic disease   Dyslipidemia: he takes Simvastatin daily and denies side effects. No chest pain or decrease in exercise tolerance.no myalgias. Reviewed labs with patient   GERD: he has been taking Omeprazole, denies heartburn, abdominal painor regurgitationrecently, discussed long term use of PPI, he is not sure if he wants to stop medication.No symptoms in a little while   Smoker's cough: he started smoking at age 57, he smokes about 0.75 pack per day. He states cough can be in am's or at night, no wheezing or SOB  Schizoaffective Disorder: he lives in a group home, has been stable on medications. Able to driveand work.He sees Teacher, music - goes to [employer] -Dr. Sherlynn Stalls , also sees a therapist. He denies side effects of medication. He denies problems sleeping, no hallucinations   Constipation: he has a bowel movement daily and sometimes twice daily but denies loose, taking Fibercon with good control of symptoms. Unchanged   Hyperglycemia: Hedenies polyphagia, polyuria or polydipsia. Last hgbA1C was back to normal down from 5.7 % to 5.4 %  Proteinuria: it was high in  March 2016, he was placed on ACE,and it normalized but went to 33 back October 2018 and up to 53  09/2017 he was seen by Dr. Candiss Norse, had US kidney that showed right kidney hydronephrosis and also stone, his bp is low today, denies dizziness, but I will try going down on dose of lisinopril from 10 to 5 mg and monitor. He is having right flank pain and also history of BPH , he is willing to see urologist now. Referral was placed   Patient Active Problem List   Diagnosis Date Noted  . Smokers' cough ([place]) 04/09/2019  . Hydronephrosis of right kidney 04/03/2018  . Right kidney stone 04/03/2018  . Avitaminosis D 10/29/2014  . Enlarged prostate 10/29/2014  . Other schizoaffective disorders ([place]) 10/10/2014  . Proteinuria 10/10/2014  . Hyperglycemia 10/10/2014  . Dyslipidemia 10/10/2014  . History of kidney stones 10/10/2014  . External hemorrhoid 10/10/2014  . Chronic constipation 10/10/2014  . Tobacco use 10/10/2014  . GERD without esophagitis 10/10/2014    History reviewed. No pertinent surgical history.  Family History  Problem Relation Age of Onset  . Parkinson's disease Mother   . Deep vein thrombosis Father   . Osteoarthritis Father     Social History   Tobacco Use  . Smoking status: Current Every Day Smoker    Packs/day: 1.00    Years: 40.00    Pack years: 40.00    Types: Cigarettes  . Smokeless tobacco: Never Used  Substance Use Topics  . Alcohol use: No    Alcohol/week: 0.0 standard drinks     Current Outpatient Medications:  .  acetaminophen (TYLENOL) 500 MG tablet, Take by mouth., Disp: , Rfl:  .  bismuth subsalicylate (PEPTO BISMOL) 262 MG chewable tablet, Chew 524 mg by mouth as needed., Disp: , Rfl:  .  Cholecalciferol (VITAMIN D3) 50 MCG (2000 UT) capsule, TAKE 1 CAPSULE BY MOUTH DAILY, Disp: 30 capsule, Rfl: 5 .  cloZAPine (CLOZARIL) 100 MG tablet, Take 100 mg by mouth daily. 2 tablets in the morning and 3 and half at bedtime, Disp: , Rfl:  .  divalproex (DEPAKOTE) 500 MG DR tablet, Take 500 mg by mouth at bedtime. Extended Release , Disp: , Rfl:  .   FLUoxetine (PROZAC) 40 MG capsule, Take 40 mg by mouth daily., Disp: , Rfl:  .  lisinopril (ZESTRIL) 5 MG tablet, Take 1 tablet (5 mg total) by mouth daily., Disp: 30 tablet, Rfl: 5 .  omeprazole (PRILOSEC) 40 MG capsule, TAKE ONE CAPSULE BY MOUTH EVERY MORNING FOR REFLUX, Disp: 30 capsule, Rfl: 5 .  polycarbophil (FIBERCON) 625 MG tablet, Take 625 mg by mouth daily., Disp: , Rfl:  .  simvastatin (ZOCOR) 10 MG tablet, Take 1 tablet (10 mg total) by mouth daily at 6 PM., Disp: 30 tablet, Rfl: 12  No Known Allergies  I personally reviewed active problem list, medication list, allergies, family history, social history, health maintenance with the patient/caregiver today.   ROS  Constitutional: Negative for fever or weight change.  Respiratory: Negative for cough and shortness of breath.   Cardiovascular: Negative for chest pain or palpitations.  Gastrointestinal: Negative for abdominal pain, no bowel changes.  Musculoskeletal: Negative for gait problem or joint swelling.  Skin: Negative for rash.  Neurological: Negative for dizziness or headache.  No other specific complaints in a complete review of systems (except as listed in HPI above).  Objective  Vitals:   04/09/19 1332  BP: (!) 90/44  Pulse: (!) 105  Resp: 16  Temp: (!) 97.3 F (36.3 C)  TempSrc: Temporal  SpO2: 95%  Weight: 175 lb (79.4 kg)  Height: 6\' 5"  (1.956 m)    Body mass index is 20.75 kg/m.  Physical Exam  Constitutional: Patient appears well-developed and well-nourished. No distress.  HEENT: head atraumatic, normocephalic, pupils equal and reactive to light Cardiovascular: Normal rate, regular rhythm and normal heart sounds.  No murmur heard. No BLE edema. Pulmonary/Chest: Effort normal and breath sounds normal. No respiratory distress. Abdominal: Soft.  There is no tenderness.Negative CVA tenderness Psychiatric: Patient has a normal mood and affect. Judgment and thought content  normal.  PHQ2/9: Depression screen [facility] 2/9 04/09/2019 10/09/2018 04/03/2018 11/14/2017 11/07/2017  Decreased Interest 0 0 0 0 0  Down, Depressed, Hopeless 0 0 0 0 0  PHQ - 2 Score 0 0 0 0 0  Altered sleeping 0 0 0 1 0  Tired, decreased energy 0 0 1 0 0  Change in appetite 0 0 0 0 0  Feeling bad or failure about yourself  0 0 0 0 0  Trouble concentrating 0 0 1 0 0  Moving slowly or fidgety/restless 0 0 0 0 0  Suicidal thoughts 0 0 0 0 0  PHQ-9 Score 0 0 2 1 0  Difficult doing work/chores - - Not difficult at all Not difficult at all Not difficult at all    phq 9 is negative   Fall Risk: Fall Risk  04/09/2019 10/09/2018 04/03/2018 11/14/2017 11/07/2017  Falls in the past year? 0 0 0 0 No  Number falls in past yr: 0 0 0 - -  Injury with Fall? 0 0 0 - -    Functional Status Survey: Is the patient deaf or have difficulty hearing?: No Does the patient have difficulty seeing, even when wearing glasses/contacts?: No Does the patient have difficulty concentrating, remembering, or making decisions?: No Does the patient have difficulty walking or climbing stairs?: No Does the patient have difficulty dressing or bathing?: No Does the patient have difficulty doing errands alone such as visiting a doctor's office or shopping?: No    Assessment & Plan  1. GERD without esophagitis  - omeprazole (PRILOSEC) 40 MG capsule; TAKE ONE CAPSULE BY MOUTH EVERY MORNING FOR REFLUX  Dispense: 30 capsule; Refill: 5  2. Microalbuminuria  - lisinopril (ZESTRIL) 5 MG tablet; Take 1 tablet (5 mg total) by mouth daily.  Dispense: 30 tablet; Refill: 5  3. Other schizoaffective disorders (HCC)  Keep follow up with psychiatrist   4. Smokers' cough (HCC)  Stable, not ready to quit smoking   5. Hydronephrosis of right kidney  - Ambulatory referral to Urology  6. Enlarged prostate  - Ambulatory referral to Urology  7. Vitamin D deficiency  Advised vitamin D 2000 units daily   8. Dyslipidemia  On  statin therapy   9. Anemia of chronic disease  Monitor   10. Right kidney stone  - Ambulatory referral to Urology

## 2019-04-09 NOTE — Patient Instructions (Signed)
Christopher Valdez , Thank you for taking time to come for your Medicare Wellness Visit. I appreciate your ongoing commitment to your health goals. Please review the following plan we discussed and let me know if I can assist you in the future.   Screening recommendations/referrals: Colonoscopy: done 07/02/12. Repeat in 2024 Recommended yearly ophthalmology/optometry visit for glaucoma screening and checkup Recommended yearly dental visit for hygiene and checkup  Vaccinations: Influenza vaccine: done 10/09/18 Pneumococcal vaccine: done 12/06/11 Tdap vaccine: done 05/28/09 Shingles vaccine: Shingrix discussed. Please contact your pharmacy for coverage information.  Covid-19: done 04/04/19  Conditions/risks identified: Recommend drinking 6-8 glasses of water per day  Next appointment: Please follow up in one year for your Medicare Annual Wellness visit.    Preventive Care 44 Years and Older, Male Preventive care refers to lifestyle choices and visits with your health care provider that can promote health and wellness. What does preventive care include?  A yearly physical exam. This is also called an annual well check.  Dental exams once or twice a year.  Routine eye exams. Ask your health care provider how often you should have your eyes checked.  Personal lifestyle choices, including:  Daily care of your teeth and gums.  Regular physical activity.  Eating a healthy diet.  Avoiding tobacco and drug use.  Limiting alcohol use.  Practicing safe sex.  Taking low doses of aspirin every day.  Taking vitamin and mineral supplements as recommended by your health care provider. What happens during an annual well check? The services and screenings done by your health care provider during your annual well check will depend on your age, overall health, lifestyle risk factors, and family history of disease. Counseling  Your health care provider may ask you questions about your:  Alcohol  use.  Tobacco use.  Drug use.  Emotional well-being.  Home and relationship well-being.  Sexual activity.  Eating habits.  History of falls.  Memory and ability to understand (cognition).  Work and work Astronomer. Screening  You may have the following tests or measurements:  Height, weight, and BMI.  Blood pressure.  Lipid and cholesterol levels. These may be checked every 5 years, or more frequently if you are over 39 years old.  Skin check.  Lung cancer screening. You may have this screening every year starting at age 58 if you have a 30-pack-year history of smoking and currently smoke or have quit within the past 15 years.  Fecal occult blood test (FOBT) of the stool. You may have this test every year starting at age 75.  Flexible sigmoidoscopy or colonoscopy. You may have a sigmoidoscopy every 5 years or a colonoscopy every 10 years starting at age 81.  Prostate cancer screening. Recommendations will vary depending on your family history and other risks.  Hepatitis C blood test.  Hepatitis B blood test.  Sexually transmitted disease (STD) testing.  Diabetes screening. This is done by checking your blood sugar (glucose) after you have not eaten for a while (fasting). You may have this done every 1-3 years.  Abdominal aortic aneurysm (AAA) screening. You may need this if you are a current or former smoker.  Osteoporosis. You may be screened starting at age 74 if you are at high risk. Talk with your health care provider about your test results, treatment options, and if necessary, the need for more tests. Vaccines  Your health care provider may recommend certain vaccines, such as:  Influenza vaccine. This is recommended every year.  Tetanus, diphtheria,  and acellular pertussis (Tdap, Td) vaccine. You may need a Td booster every 10 years.  Zoster vaccine. You may need this after age 34.  Pneumococcal 13-valent conjugate (PCV13) vaccine. One dose is  recommended after age 61.  Pneumococcal polysaccharide (PPSV23) vaccine. One dose is recommended after age 37. Talk to your health care provider about which screenings and vaccines you need and how often you need them. This information is not intended to replace advice given to you by your health care provider. Make sure you discuss any questions you have with your health care provider. Document Released: 01/23/2015 Document Revised: 09/16/2015 Document Reviewed: 10/28/2014 Elsevier Interactive Patient Education  2017 [place] Prevention in the Home Falls can cause injuries. They can happen to people of all ages. There are many things you can do to make your home safe and to help prevent falls. What can I do on the outside of my home?  Regularly fix the edges of walkways and driveways and fix any cracks.  Remove anything that might make you trip as you walk through a door, such as a raised step or threshold.  Trim any bushes or trees on the path to your home.  Use bright outdoor lighting.  Clear any walking paths of anything that might make someone trip, such as rocks or tools.  Regularly check to see if handrails are loose or broken. Make sure that both sides of any steps have handrails.  Any raised decks and porches should have guardrails on the edges.  Have any leaves, snow, or ice cleared regularly.  Use sand or salt on walking paths during winter.  Clean up any spills in your garage right away. This includes oil or grease spills. What can I do in the bathroom?  Use night lights.  Install grab bars by the toilet and in the tub and shower. Do not use towel bars as grab bars.  Use non-skid mats or decals in the tub or shower.  If you need to sit down in the shower, use a plastic, non-slip stool.  Keep the floor dry. Clean up any water that spills on the floor as soon as it happens.  Remove soap buildup in the tub or shower regularly.  Attach bath mats  securely with double-sided non-slip rug tape.  Do not have throw rugs and other things on the floor that can make you trip. What can I do in the bedroom?  Use night lights.  Make sure that you have a light by your bed that is easy to reach.  Do not use any sheets or blankets that are too big for your bed. They should not hang down onto the floor.  Have a firm chair that has side arms. You can use this for support while you get dressed.  Do not have throw rugs and other things on the floor that can make you trip. What can I do in the kitchen?  Clean up any spills right away.  Avoid walking on wet floors.  Keep items that you use a lot in easy-to-reach places.  If you need to reach something above you, use a strong step stool that has a grab bar.  Keep electrical cords out of the way.  Do not use floor polish or wax that makes floors slippery. If you must use wax, use non-skid floor wax.  Do not have throw rugs and other things on the floor that can make you trip. What can I do with my  stairs?  Do not leave any items on the stairs.  Make sure that there are handrails on both sides of the stairs and use them. Fix handrails that are broken or loose. Make sure that handrails are as long as the stairways.  Check any carpeting to make sure that it is firmly attached to the stairs. Fix any carpet that is loose or worn.  Avoid having throw rugs at the top or bottom of the stairs. If you do have throw rugs, attach them to the floor with carpet tape.  Make sure that you have a light switch at the top of the stairs and the bottom of the stairs. If you do not have them, ask someone to add them for you. What else can I do to help prevent falls?  Wear shoes that:  Do not have high heels.  Have rubber bottoms.  Are comfortable and fit you well.  Are closed at the toe. Do not wear sandals.  If you use a stepladder:  Make sure that it is fully opened. Do not climb a closed  stepladder.  Make sure that both sides of the stepladder are locked into place.  Ask someone to hold it for you, if possible.  Clearly mark and make sure that you can see:  Any grab bars or handrails.  First and last steps.  Where the edge of each step is.  Use tools that help you move around (mobility aids) if they are needed. These include:  Canes.  Walkers.  Scooters.  Crutches.  Turn on the lights when you go into a dark area. Replace any light bulbs as soon as they burn out.  Set up your furniture so you have a clear path. Avoid moving your furniture around.  If any of your floors are uneven, fix them.  If there are any pets around you, be aware of where they are.  Review your medicines with your doctor. Some medicines can make you feel dizzy. This can increase your chance of falling. Ask your doctor what other things that you can do to help prevent falls. This information is not intended to replace advice given to you by your health care provider. Make sure you discuss any questions you have with your health care provider. Document Released: 10/23/2008 Document Revised: 06/04/2015 Document Reviewed: 01/31/2014 Elsevier Interactive Patient Education  2017 [employer].

## 2019-04-16 ENCOUNTER — Ambulatory Visit: Payer: Medicare Other | Admitting: Urology

## 2019-04-16 ENCOUNTER — Other Ambulatory Visit: Payer: Self-pay

## 2019-04-16 ENCOUNTER — Ambulatory Visit: Payer: Medicare Other

## 2019-04-16 VITALS — BP 86/52 | HR 102

## 2019-04-16 DIAGNOSIS — I959 Hypotension, unspecified: Secondary | ICD-10-CM

## 2019-04-16 NOTE — Progress Notes (Signed)
Patient is here for a blood pressure check. Patient denies chest pain, palpitations, shortness of breath or visual disturbances. At previous visit blood pressure was 90/46 with a heart rate of 93. Today during nurse visit first check blood pressure was 80/50. After resting for 10 minutes it was 86/52 and heart rate was 102. He does take any blood pressure medications.  States Saturday he was a little dizzy and lightheaded per Dr. Carlynn Purl recommendations he is to stop Lisinopril and come back in 1 week for BP check.

## 2019-04-17 DIAGNOSIS — J300 Vasomotor rhinitis: Secondary | ICD-10-CM | POA: Diagnosis not present

## 2019-04-23 ENCOUNTER — Ambulatory Visit: Payer: Medicare Other

## 2019-04-23 ENCOUNTER — Other Ambulatory Visit: Payer: Self-pay

## 2019-04-23 VITALS — BP 132/74 | HR 89

## 2019-04-23 DIAGNOSIS — I959 Hypotension, unspecified: Secondary | ICD-10-CM

## 2019-04-23 NOTE — Progress Notes (Signed)
Patient is here for a blood pressure check. Patient denies chest pain, palpitations, shortness of breath or visual disturbances. At previous visit blood pressure was 86/52 with a heart rate of 102. Today during nurse visit first check blood pressure was 132/74. and heart rate was 89. He does not take any blood pressure medications.

## 2019-04-29 ENCOUNTER — Other Ambulatory Visit: Payer: Self-pay | Admitting: Family Medicine

## 2019-04-29 DIAGNOSIS — Z87442 Personal history of urinary calculi: Secondary | ICD-10-CM

## 2019-04-30 ENCOUNTER — Ambulatory Visit (INDEPENDENT_AMBULATORY_CARE_PROVIDER_SITE_OTHER): Payer: Medicare Other | Admitting: Urology

## 2019-04-30 ENCOUNTER — Other Ambulatory Visit: Payer: Self-pay

## 2019-04-30 ENCOUNTER — Encounter: Payer: Self-pay | Admitting: Urology

## 2019-04-30 VITALS — BP 103/67 | HR 91 | Ht 77.0 in | Wt 175.0 lb

## 2019-04-30 DIAGNOSIS — R1011 Right upper quadrant pain: Secondary | ICD-10-CM

## 2019-04-30 DIAGNOSIS — N200 Calculus of kidney: Secondary | ICD-10-CM | POA: Diagnosis not present

## 2019-04-30 NOTE — Progress Notes (Signed)
04/30/19 1:41 PM   Purvis Sheffield Jr. 1960-05-19 [id number]  CC: Right flank pain  HPI: I saw Christopher Valdez in urology clinic today for evaluation of intermittent right flank pain.  He is a 59 year old male referred for the right-sided pain as well as a 17 mm right renal calculus previously seen on renal ultrasound in July 2020.  Notably, he also had an ultrasound in December 2019 with mild right-sided hydronephrosis and a 17 mm right renal calculus, as well as a CT report from June 2009 that reports a 1 cm right renal pelvis stone with mild hydronephrosis.  He has schizoaffective disorder lives in a group home, but he reports he makes his own medical decisions.  He denies that he is ever seen a urologist before, passed any kidney stones, or undergone any prior urology procedures.  He denies any fevers, chills, gross hematuria, or urinary symptoms.  He was unable to void for urinalysis today.  The patient is a poor historian, but it sounds like he has been having some intermittent right-sided pain a few times a week in the right flank for at least a few months.   PMH: Past Medical History:  Diagnosis Date  . Calculus of kidney   . Chronic constipation   . Dyslipidemia   . GERD (gastroesophageal reflux disease)   . Proteinuria   . Schizo-affective schizophrenia (HCC)   . Vitamin D deficiency     Surgical History: Past Surgical History:  Procedure Laterality Date  . COLONOSCOPY  07/02/2012    Family History: Family History  Problem Relation Age of Onset  . Parkinson's disease Mother   . Deep vein thrombosis Father   . Osteoarthritis Father     Social History:  reports that he has been smoking cigarettes. He has a 40.00 pack-year smoking history. He has never used smokeless tobacco. He reports that he does not drink alcohol or use drugs.  Physical Exam: BP 103/67 (BP Location: Left Arm, Patient Position: Sitting, Cuff Size: Normal)   Pulse 91   Ht 6\' 5"  (1.956 m)   Wt 175 lb  (79.4 kg)   BMI 20.75 kg/m    Constitutional:  Alert and oriented, No acute distress. Cardiovascular: No clubbing, cyanosis, or edema. Respiratory: Normal respiratory effort, no increased work of breathing. GI: Abdomen is soft, nontender, nondistended, no abdominal masses GU: R CVA tenderness  Laboratory Data: Reviewed Unable to void for urine sample today  Pertinent Imaging: I have personally reviewed the prior renal ultrasounds, see HPI for details  Assessment & Plan:   In summary, he is a relatively healthy 59 year old male with schizoaffective disorder who lives in a group home who has intermittent right-sided flank pain consistent with an intermittently obstructing right renal pelvis stone.  This is consistent with his prior renal ultrasounds, as well as CT as far back as 2009 that showed a 1 cm right renal pelvis stone.  We discussed the need for further imaging with CT to evaluate stone size, location, density and determine the best treatment strategy.  We discussed various treatment options for urolithiasis including observation with or without medical expulsive therapy, shockwave lithotripsy (SWL), ureteroscopy and laser lithotripsy with stent placement, and percutaneous nephrolithotomy.  We discussed that management is based on stone size, location, density, patient co-morbidities, and patient preference.   SWL has a lower stone free rate in a single procedure, but also a lower complication rate compared to ureteroscopy and avoids a stent and associated stent related symptoms. Possible  complications include renal hematoma, steinstrasse, and need for additional treatment.  Ureteroscopy with laser lithotripsy and stent placement has a higher stone free rate than SWL in a single procedure, however increased complication rate including possible infection, ureteral injury, bleeding, and stent related morbidity. Common stent related symptoms include dysuria, urgency/frequency, and flank  pain.  PCNL is the favored treatment for stones >2cm. It involves a small incision in the flank, with complete fragmentation of stones and removal. It has the highest stone free rate, but also the highest complication rate. Possible complications include bleeding, infection/sepsis, injury to surrounding organs including the pleura, and collecting system injury.   CT stone protocol ordered Follow-up in person with sister to discuss results and surgical options  Nickolas Madrid, MD 04/30/2019  [facility] [address] [place], [place] [address] [phone]

## 2019-05-01 ENCOUNTER — Ambulatory Visit
Admission: RE | Admit: 2019-05-01 | Discharge: 2019-05-01 | Disposition: A | Discharge status: Home / Self Care | Payer: Medicare Other | Source: Ambulatory Visit | Attending: Urology | Admitting: Urology

## 2019-05-01 ENCOUNTER — Telehealth: Payer: Self-pay

## 2019-05-01 DIAGNOSIS — R1011 Right upper quadrant pain: Secondary | ICD-10-CM | POA: Insufficient documentation

## 2019-05-01 DIAGNOSIS — J300 Vasomotor rhinitis: Secondary | ICD-10-CM | POA: Diagnosis not present

## 2019-05-01 DIAGNOSIS — R067 Sneezing: Secondary | ICD-10-CM | POA: Diagnosis not present

## 2019-05-01 DIAGNOSIS — R0982 Postnasal drip: Secondary | ICD-10-CM | POA: Diagnosis not present

## 2019-05-01 DIAGNOSIS — R109 Unspecified abdominal pain: Secondary | ICD-10-CM | POA: Diagnosis not present

## 2019-05-01 DIAGNOSIS — Z79899 Other long term (current) drug therapy: Secondary | ICD-10-CM | POA: Diagnosis not present

## 2019-05-01 NOTE — Telephone Encounter (Signed)
Called pt's group home, left detailed message with caregiver of information below. She voiced understanding.

## 2019-05-01 NOTE — Telephone Encounter (Signed)
-----   Message from Sondra Come, MD sent at 05/01/2019  1:09 PM EDT ----- Large kidney stone on right side that will need surgical removal. Keep follow up next week to discuss, please ask him to bring his sister to help with medical decisions  Thanks Legrand Rams, MD 05/01/2019

## 2019-05-07 ENCOUNTER — Encounter: Payer: Self-pay | Admitting: Urology

## 2019-05-07 ENCOUNTER — Other Ambulatory Visit: Payer: Self-pay

## 2019-05-07 ENCOUNTER — Other Ambulatory Visit
Admission: RE | Admit: 2019-05-07 | Discharge: 2019-05-07 | Disposition: A | Discharge status: Home / Self Care | Payer: Medicare Other | Attending: Urology | Admitting: Urology

## 2019-05-07 ENCOUNTER — Ambulatory Visit (INDEPENDENT_AMBULATORY_CARE_PROVIDER_SITE_OTHER): Payer: Medicare Other | Admitting: Urology

## 2019-05-07 VITALS — BP 126/69 | HR 86 | Ht 77.0 in | Wt 175.0 lb

## 2019-05-07 DIAGNOSIS — N200 Calculus of kidney: Secondary | ICD-10-CM

## 2019-05-07 LAB — URINALYSIS, COMPLETE (UACMP) WITH MICROSCOPIC
Bilirubin Urine: NEGATIVE
Glucose, UA: NEGATIVE mg/dL
Ketones, ur: NEGATIVE mg/dL
Leukocytes,Ua: NEGATIVE
Nitrite: NEGATIVE
Protein, ur: NEGATIVE mg/dL
Specific Gravity, Urine: 1.02 (ref 1.005–1.030)
pH: 7 (ref 5.0–8.0)

## 2019-05-07 NOTE — Progress Notes (Signed)
05/07/2019 12:58 PM   Melchor Amour. Mar 20, 1960 [id number]  Reason for visit: Follow up right renal stone  HPI: I saw Mr. Ahad Colarusso back in urology clinic for follow-up of his CT results.  He is a 59 year old man with developmental delay who lives in a group home who has had years of intermittent right-sided flank pain.  A CT was performed on 05/01/2019 that showed a 1.5 cm renal pelvis stone with some mild right-sided hydronephrosis.  The stone was extremely dense with 2000HU.  He continues to deny any lower urinary tract symptoms or fevers.  He does have occasional right-sided flank pain.  He is here with someone from his group home, but he makes his own medical decisions and signed his own consents.  Physical Exam: BP 126/69 (BP Location: Left Arm, Patient Position: Sitting, Cuff Size: Normal)   Pulse 86   Ht 6\' 5"  (1.956 m)   Wt 175 lb (79.4 kg)   BMI 20.75 kg/m    Constitutional:  Alert and oriented, No acute distress. GU: R CVA tenderness  Laboratory Data: Urinalysis today few bacteria, 11-20 RBCs, 0-5 WBCs, negative leukocytes, nitrite negative  Pertinent Imaging: I have personally reviewed the CT stone protocol dated 4/21.  1.5 cm right renal pelvis stone with mild hydronephrosis, 2000 HU  Assessment & Plan:   In summary, is a 59 year old male with developmental delay and long history of an untreated 1.5 cm intermittently obstructing right renal pelvis stone.  I recommended definitive management of the stone and as it is way too large to pass, and is likely causing his intermittent flank pain, and also is at risk for UTI and long-term renal damage.  We discussed various treatment options for urolithiasis including observation with or without medical expulsive therapy, shockwave lithotripsy (SWL), ureteroscopy and laser lithotripsy with stent placement, and percutaneous nephrolithotomy.  We discussed that management is based on stone size, location, density, patient  co-morbidities, and patient preference.   Stones <16mm in size have a >80% spontaneous passage rate. Data surrounding the use of tamsulosin for medical expulsive therapy is controversial, but meta analyses suggests it is most efficacious for distal stones between 5-21mm in size. Possible side effects include dizziness/lightheadedness, and retrograde ejaculation.  SWL has a lower stone free rate in a single procedure, but also a lower complication rate compared to ureteroscopy and avoids a stent and associated stent related symptoms. Possible complications include renal hematoma, steinstrasse, and need for additional treatment.  Ureteroscopy with laser lithotripsy and stent placement has a higher stone free rate than SWL in a single procedure, however increased complication rate including possible infection, ureteral injury, bleeding, and stent related morbidity. Common stent related symptoms include dysuria, urgency/frequency, and flank pain.  PCNL is the favored treatment for stones >2cm. It involves a small incision in the flank, with complete fragmentation of stones and removal. It has the highest stone free rate, but also the highest complication rate. Possible complications include bleeding, infection/sepsis, injury to surrounding organs including the pleura, and collecting system injury.   After an extensive discussion of the risks and benefits of the above treatment options, the patient would like to proceed with RIGHT URETEROSCOPY, LASER LITHOTRIPSY, STENT PLACEMENT. We specifically discussed the risks ureteroscopy including bleeding, infection/sepsis, stent related symptoms including flank pain/urgency/frequency/incontinence/dysuria, ureteral injury, inability to access stone, or need for staged or additional procedures.  Schedule right ureteroscopy, laser lithotripsy, stent placement  I spent 30 total minutes on the day of the encounter including  pre-visit review of the medical record,  face-to-face time with the patient, and post visit ordering of labs/imaging/tests.  Billey Co, [facility] [address] [place], [place] [address] [phone]

## 2019-05-07 NOTE — Patient Instructions (Signed)
Laser Therapy for Kidney Stones Laser therapy for kidney stones is a procedure to break up small, hard mineral deposits that form in the kidney (kidney stones). The procedure is done using a device that produces a focused beam of light (laser). The laser breaks up kidney stones into pieces that are small enough to be passed out of the body through urination or removed from the body during the procedure. You may need laser therapy if you have kidney stones that are painful or block your urinary tract. This procedure is done by inserting a tube (ureteroscope) into your kidney through the urethral opening. The urethra is the part of the body that drains urine from the bladder. In women, the urethra opens above the vaginal opening. In men, the urethra opens at the tip of the penis. The ureteroscope is inserted through the urethra, and surgical instruments are moved through the bladder and the muscular tube that connects the kidney to the bladder (ureter) until they reach the kidney. Tell a health care provider about:  Any allergies you have.  All medicines you are taking, including vitamins, herbs, eye drops, creams, and over-the-counter medicines.  Any problems you or family members have had with anesthetic medicines.  Any blood disorders you have.  Any surgeries you have had.  Any medical conditions you have.  Whether you are pregnant or may be pregnant. What are the risks? Generally, this is a safe procedure. However, problems may occur, including:  Infection.  Bleeding.  Allergic reactions to medicines.  Damage to the urethra, bladder, or ureter.  Urinary tract infection (UTI).  Narrowing of the urethra (urethral stricture).  Difficulty passing urine.  Blockage of the kidney caused by a fragment of kidney stone. What happens before the procedure? Medicines  Ask your health care provider about: ? Changing or stopping your regular medicines. This is especially important if you  are taking diabetes medicines or blood thinners. ? Taking medicines such as aspirin and ibuprofen. These medicines can thin your blood. Do not take these medicines unless your health care provider tells you to take them. ? Taking over-the-counter medicines, vitamins, herbs, and supplements. Eating and drinking Follow instructions from your health care provider about eating and drinking, which may include:  8 hours before the procedure - stop eating heavy meals or foods, such as meat, fried foods, or fatty foods.  6 hours before the procedure - stop eating light meals or foods, such as toast or cereal.  6 hours before the procedure - stop drinking milk or drinks that contain milk.  2 hours before the procedure - stop drinking clear liquids. Staying hydrated Follow instructions from your health care provider about hydration, which may include:  Up to 2 hours before the procedure - you may continue to drink clear liquids, such as water, clear fruit juice, black coffee, and plain tea.  General instructions  You may have a physical exam before the procedure. You may also have tests, such as imaging tests and blood or urine tests.  If your ureter is too narrow, your health care provider may place a soft, flexible tube (stent) inside of it. The stent may be placed days or weeks before your laser therapy procedure.  Plan to have someone take you home from the hospital or clinic.  If you will be going home right after the procedure, plan to have someone stay with you for 24 hours.  Do not use any products that contain nicotine or tobacco for at least 4   weeks before the procedure. These products include cigarettes, e-cigarettes, and chewing tobacco. If you need help quitting, ask your health care provider.  Ask your health care provider: ? How your surgical site will be marked or identified. ? What steps will be taken to help prevent infection. These may include:  Removing hair at the surgery  site.  Washing skin with a germ-killing soap.  Taking antibiotic medicine. What happens during the procedure?   An IV will be inserted into one of your veins.  You will be given one or more of the following: ? A medicine to help you relax (sedative). ? A medicine to numb the area (local anesthetic). ? A medicine to make you fall asleep (general anesthetic).  A ureteroscope will be inserted into your urethra. The ureteroscope will send images to a video screen in the operating room to guide your surgeon to the area of your kidney that will be treated.  A small, flexible tube will be threaded through the ureteroscope and into your bladder and ureter, up to your kidney.  The laser device will be inserted into your kidney through the tube. Your surgeon will pulse the laser on and off to break up kidney stones.  A surgical instrument that has a tiny wire basket may be inserted through the tube into your kidney to remove the pieces of broken kidney stone. The procedure may vary among health care providers and hospitals. What happens after the procedure?  Your blood pressure, heart rate, breathing rate, and blood oxygen level will be monitored until you leave the hospital or clinic.  You will be given pain medicine as needed.  You may continue to receive antibiotics.  You may have a stent temporarily placed in your ureter.  Do not drive for 24 hours if you were given a sedative during your procedure.  You may be given a strainer to collect any stone fragments that you pass in your urine. Your health care provider may have these tested. Summary  Laser therapy for kidney stones is a procedure to break up kidney stones into pieces that are small enough to be passed out of the body through urination or removed during the procedure.  Follow instructions from your health care provider about eating and drinking before the procedure.  During the procedure, the ureteroscope will send images  to a video screen to guide your surgeon to the area of your kidney that will be treated.  Do not drive for 24 hours if you were given a sedative during your procedure. This information is not intended to replace advice given to you by your health care provider. Make sure you discuss any questions you have with your health care provider. Document Revised: 09/07/2017 Document Reviewed: 09/07/2017 Elsevier Patient Education  2020 [employer]   Ureteral Stent Implantation  Ureteral stent implantation is a procedure to insert (implant) a flexible, soft, plastic tube (stent) into a ureter. Ureters are the tube-like parts of the body that drain urine from the kidneys. The stent supports the ureter while it heals and helps to drain urine. You may have a ureteral stent implanted after having a procedure to remove a blockage from the ureter (ureterolysis or pyeloplasty). You may also have a stent implanted to open the flow of urine when you have a blockage caused by a kidney stone, tumor, blood clot, or infection. You have two ureters, one on each side of the body. The ureters connect the kidneys to the organ that holds urine   until it passes out of the body (bladder). The stent is placed so that one end is in the kidney, and one end is in the bladder. The stent is usually taken out after your ureter has healed. Depending on your condition, you may have a stent for just a few weeks, or you may have a long-term stent that will need to be replaced every few months. Tell a health care provider about:  Any allergies you have.  All medicines you are taking, including vitamins, herbs, eye drops, creams, and over-the-counter medicines.  Any problems you or family members have had with anesthetic medicines.  Any blood disorders you have.  Any surgeries you have had.  Any medical conditions you have.  Whether you are pregnant or may be pregnant. What are the risks? Generally, this is a safe procedure.  However, problems may occur, including:  Infection.  Bleeding.  Allergic reactions to medicines.  Damage to other structures or organs. Tearing (perforation) of the ureter is possible.  Movement of the stent away from where it is placed during surgery (migration). What happens before the procedure? Medicines Ask your health care provider about:  Changing or stopping your regular medicines. This is especially important if you are taking diabetes medicines or blood thinners.  Taking medicines such as aspirin and ibuprofen. These medicines can thin your blood. Do not take these medicines unless your health care provider tells you to take them.  Taking over-the-counter medicines, vitamins, herbs, and supplements. Eating and drinking Follow instructions from your health care provider about eating and drinking, which may include:  8 hours before the procedure - stop eating heavy meals or foods, such as meat, fried foods, or fatty foods.  6 hours before the procedure - stop eating light meals or foods, such as toast or cereal.  6 hours before the procedure - stop drinking milk or drinks that contain milk.  2 hours before the procedure - stop drinking clear liquids. Staying hydrated Follow instructions from your health care provider about hydration, which may include:  Up to 2 hours before the procedure - you may continue to drink clear liquids, such as water, clear fruit juice, black coffee, and plain tea. General instructions  Do not drink alcohol.  Do not use any products that contain nicotine or tobacco for at least 4 weeks before the procedure. These products include cigarettes, e-cigarettes, and chewing tobacco. If you need help quitting, ask your health care provider.  You may have an exam or testing, such as imaging or blood tests.  Ask your health care provider what steps will be taken to help prevent infection. These may include: ? Removing hair at the surgery  site. ? Washing skin with a germ-killing soap. ? Taking antibiotic medicine.  Plan to have someone take you home from the hospital or clinic.  If you will be going home right after the procedure, plan to have someone with you for 24 hours. What happens during the procedure?  An IV will be inserted into one of your veins.  You may be given a medicine to help you relax (sedative).  You may be given a medicine to make you fall asleep (general anesthetic).  A thin, tube-shaped instrument with a light and tiny camera at the end (cystoscope) will be inserted into your urethra. The urethra is the tube that drains urine from the bladder out of the body. In men, the urethra opens at the end of the penis. In women, the urethra opens   in front of the vaginal opening.  The cystoscope will be passed into your bladder.  A thin wire (guide wire) will be passed through your bladder and into your ureter. This is used to guide the stent into your ureter.  The stent will be inserted into your ureter.  The guide wire and the cystoscope will be removed.  A flexible tube (catheter) may be inserted through your urethra so that one end is in your bladder. This helps to drain urine from your bladder. The procedure may vary among hospitals and health care providers. What happens after the procedure?  Your blood pressure, heart rate, breathing rate, and blood oxygen level will be monitored until you leave the hospital or clinic.  You may continue to receive medicine and fluids through an IV.  You may have some soreness or pain in your abdomen and urethra. Medicines will be available to help you.  You will be encouraged to get up and walk around as soon as you can.  You may have a catheter draining your urine.  You will have some blood in your urine.  Do not drive for 24 hours if you were given a sedative during your procedure. Summary  Ureteral stent implantation is a procedure to insert a flexible,  soft, plastic tube (stent) into a ureter.  You may have a stent implanted to support the ureter while it heals after a procedure or to open the flow of urine if there is a blockage.  Follow instructions from your health care provider about taking medicines and about eating and drinking before the procedure.  Depending on your condition, you may have a stent for just a few weeks, or you may have a long-term stent that will need to be replaced every few months. This information is not intended to replace advice given to you by your health care provider. Make sure you discuss any questions you have with your health care provider. Document Revised: 10/03/2017 Document Reviewed: 10/04/2017 Elsevier Patient Education  2020 [employer] Inc[employer]   

## 2019-05-08 LAB — URINE CULTURE: Culture: NO GROWTH

## 2019-05-09 ENCOUNTER — Other Ambulatory Visit: Payer: Self-pay | Admitting: Urology

## 2019-05-09 DIAGNOSIS — N200 Calculus of kidney: Secondary | ICD-10-CM

## 2019-05-10 ENCOUNTER — Ambulatory Visit: Payer: Medicare Other | Admitting: Anesthesiology

## 2019-05-10 ENCOUNTER — Encounter: Payer: Self-pay | Admitting: Urology

## 2019-05-10 ENCOUNTER — Other Ambulatory Visit
Admission: RE | Admit: 2019-05-10 | Discharge: 2019-05-10 | Disposition: A | Discharge status: Home / Self Care | Payer: Medicare Other | Source: Ambulatory Visit | Attending: Urology | Admitting: Urology

## 2019-05-10 ENCOUNTER — Ambulatory Visit
Admission: RE | Admit: 2019-05-10 | Discharge: 2019-05-10 | Disposition: A | Discharge status: Home / Self Care | Payer: Medicare Other | Attending: Urology | Admitting: Urology

## 2019-05-10 ENCOUNTER — Ambulatory Visit: Payer: Medicare Other

## 2019-05-10 ENCOUNTER — Other Ambulatory Visit: Payer: Self-pay

## 2019-05-10 ENCOUNTER — Encounter
Admission: RE | Disposition: A | Discharge status: Home / Self Care | Payer: Self-pay | Source: Home / Self Care | Attending: Urology

## 2019-05-10 DIAGNOSIS — Z20822 Contact with and (suspected) exposure to covid-19: Secondary | ICD-10-CM | POA: Insufficient documentation

## 2019-05-10 DIAGNOSIS — N200 Calculus of kidney: Secondary | ICD-10-CM | POA: Diagnosis not present

## 2019-05-10 DIAGNOSIS — F209 Schizophrenia, unspecified: Secondary | ICD-10-CM | POA: Diagnosis not present

## 2019-05-10 DIAGNOSIS — F17200 Nicotine dependence, unspecified, uncomplicated: Secondary | ICD-10-CM | POA: Insufficient documentation

## 2019-05-10 HISTORY — PX: CYSTOSCOPY/URETEROSCOPY/HOLMIUM LASER/STENT PLACEMENT: SHX6546

## 2019-05-10 HISTORY — PX: CYSTOSCOPY W/ RETROGRADES: SHX1426

## 2019-05-10 LAB — RESPIRATORY PANEL BY RT PCR (FLU A&B, COVID)
Influenza A by PCR: NEGATIVE
Influenza B by PCR: NEGATIVE
SARS Coronavirus 2 by RT PCR: NEGATIVE

## 2019-05-10 SURGERY — CYSTOSCOPY/URETEROSCOPY/HOLMIUM LASER/STENT PLACEMENT
Anesthesia: General | Site: Ureter | Laterality: Right

## 2019-05-10 MED ORDER — HYDROCODONE-ACETAMINOPHEN 5-325 MG PO TABS: 1.0000 | ORAL_TABLET | Freq: Four times a day (QID) | ORAL | 0 refills | Status: AC | PRN

## 2019-05-10 MED ORDER — FENTANYL CITRATE (PF) 100 MCG/2ML IJ SOLN: 25.0000 ug | INTRAMUSCULAR | Status: DC | PRN

## 2019-05-10 MED ORDER — KETOROLAC TROMETHAMINE 30 MG/ML IJ SOLN
INTRAMUSCULAR | Status: DC | PRN
  Administered 2019-05-10: 15 mg via INTRAVENOUS

## 2019-05-10 MED ORDER — IOHEXOL 180 MG/ML  SOLN
INTRAMUSCULAR | Status: DC | PRN
  Administered 2019-05-10: 20 mL

## 2019-05-10 MED ORDER — ACETAMINOPHEN 10 MG/ML IV SOLN: 1000.0000 mg | Freq: Once | INTRAVENOUS | Status: DC | PRN

## 2019-05-10 MED ORDER — CEFAZOLIN SODIUM-DEXTROSE 2-4 GM/100ML-% IV SOLN
INTRAVENOUS | Status: AC
  Filled 2019-05-10: qty 100

## 2019-05-10 MED ORDER — SUGAMMADEX SODIUM 200 MG/2ML IV SOLN
INTRAVENOUS | Status: DC | PRN
  Administered 2019-05-10: 200 mg via INTRAVENOUS

## 2019-05-10 MED ORDER — LIDOCAINE HCL (PF) 2 % IJ SOLN
INTRAMUSCULAR | Status: AC
  Filled 2019-05-10: qty 5

## 2019-05-10 MED ORDER — OXYCODONE HCL 5 MG/5ML PO SOLN: 5.0000 mg | Freq: Once | ORAL | Status: DC | PRN

## 2019-05-10 MED ORDER — BELLADONNA ALKALOIDS-OPIUM 16.2-60 MG RE SUPP
RECTAL | Status: DC | PRN
  Administered 2019-05-10: 1 via RECTAL

## 2019-05-10 MED ORDER — CEFAZOLIN SODIUM-DEXTROSE 2-4 GM/100ML-% IV SOLN
2.0000 g | INTRAVENOUS | Status: AC
  Administered 2019-05-10: 2 g via INTRAVENOUS

## 2019-05-10 MED ORDER — FAMOTIDINE 20 MG PO TABS
ORAL_TABLET | ORAL | Status: AC
  Administered 2019-05-10: 20 mg via ORAL
  Filled 2019-05-10: qty 1

## 2019-05-10 MED ORDER — FENTANYL CITRATE (PF) 100 MCG/2ML IJ SOLN
INTRAMUSCULAR | Status: DC | PRN
  Administered 2019-05-10 (×2): 50 ug via INTRAVENOUS

## 2019-05-10 MED ORDER — ONDANSETRON HCL 4 MG/2ML IJ SOLN: 4.0000 mg | Freq: Once | INTRAMUSCULAR | Status: DC | PRN

## 2019-05-10 MED ORDER — ONDANSETRON HCL 4 MG/2ML IJ SOLN
INTRAMUSCULAR | Status: DC | PRN
  Administered 2019-05-10: 4 mg via INTRAVENOUS

## 2019-05-10 MED ORDER — GLYCOPYRROLATE 0.2 MG/ML IJ SOLN
INTRAMUSCULAR | Status: AC
  Filled 2019-05-10: qty 1

## 2019-05-10 MED ORDER — PROPOFOL 10 MG/ML IV BOLUS
INTRAVENOUS | Status: DC | PRN
  Administered 2019-05-10: 170 mg via INTRAVENOUS

## 2019-05-10 MED ORDER — LIDOCAINE HCL (CARDIAC) PF 100 MG/5ML IV SOSY
PREFILLED_SYRINGE | INTRAVENOUS | Status: DC | PRN
  Administered 2019-05-10: 100 mg via INTRAVENOUS

## 2019-05-10 MED ORDER — SUCCINYLCHOLINE CHLORIDE 20 MG/ML IJ SOLN
INTRAMUSCULAR | Status: DC | PRN
  Administered 2019-05-10: 100 mg via INTRAVENOUS

## 2019-05-10 MED ORDER — KETOROLAC TROMETHAMINE 30 MG/ML IJ SOLN
INTRAMUSCULAR | Status: AC
  Filled 2019-05-10: qty 1

## 2019-05-10 MED ORDER — ROCURONIUM BROMIDE 100 MG/10ML IV SOLN
INTRAVENOUS | Status: DC | PRN
  Administered 2019-05-10 (×2): 30 mg via INTRAVENOUS

## 2019-05-10 MED ORDER — SULFAMETHOXAZOLE-TRIMETHOPRIM 800-160 MG PO TABS
1.0000 | ORAL_TABLET | Freq: Every day | ORAL | 0 refills | Status: DC
Start: 2019-05-10 — End: 2019-06-26

## 2019-05-10 MED ORDER — FENTANYL CITRATE (PF) 100 MCG/2ML IJ SOLN
INTRAMUSCULAR | Status: AC
  Filled 2019-05-10: qty 2

## 2019-05-10 MED ORDER — TAMSULOSIN HCL 0.4 MG PO CAPS
0.4000 mg | ORAL_CAPSULE | Freq: Every day | ORAL | 0 refills | Status: DC
Start: 2019-05-10 — End: 2019-06-26

## 2019-05-10 MED ORDER — LACTATED RINGERS IV SOLN: INTRAVENOUS | Status: DC

## 2019-05-10 MED ORDER — SUCCINYLCHOLINE CHLORIDE 200 MG/10ML IV SOSY
PREFILLED_SYRINGE | INTRAVENOUS | Status: AC
  Filled 2019-05-10: qty 10

## 2019-05-10 MED ORDER — DEXAMETHASONE SODIUM PHOSPHATE 10 MG/ML IJ SOLN
INTRAMUSCULAR | Status: DC | PRN
  Administered 2019-05-10: 10 mg via INTRAVENOUS

## 2019-05-10 MED ORDER — PROPOFOL 10 MG/ML IV BOLUS
INTRAVENOUS | Status: AC
  Filled 2019-05-10: qty 20

## 2019-05-10 MED ORDER — OXYCODONE HCL 5 MG PO TABS: 5.0000 mg | ORAL_TABLET | Freq: Once | ORAL | Status: DC | PRN

## 2019-05-10 MED ORDER — ROCURONIUM BROMIDE 10 MG/ML (PF) SYRINGE
PREFILLED_SYRINGE | INTRAVENOUS | Status: AC
  Filled 2019-05-10: qty 10

## 2019-05-10 MED ORDER — FAMOTIDINE 20 MG PO TABS: 20.0000 mg | ORAL_TABLET | Freq: Once | ORAL | Status: AC

## 2019-05-10 MED ORDER — BELLADONNA ALKALOIDS-OPIUM 16.2-60 MG RE SUPP
RECTAL | Status: AC
  Filled 2019-05-10: qty 1

## 2019-05-10 SURGICAL SUPPLY — 31 items
BAG DRAIN CYSTO-URO LG1000N (MISCELLANEOUS) ×3 IMPLANT
BRUSH SCRUB EZ 1% IODOPHOR (MISCELLANEOUS) ×3 IMPLANT
CATH URETL 5X70 OPEN END (CATHETERS) IMPLANT
CNTNR SPEC 2.5X3XGRAD LEK (MISCELLANEOUS)
CONT SPEC 4OZ STER OR WHT (MISCELLANEOUS)
CONTAINER SPEC 2.5X3XGRAD LEK (MISCELLANEOUS) IMPLANT
DRAPE UTILITY 15X26 TOWEL STRL (DRAPES) ×3 IMPLANT
FIBER LASER TRAC TIP (UROLOGICAL SUPPLIES) ×3 IMPLANT
GLOVE BIOGEL PI IND STRL 7.5 (GLOVE) ×1 IMPLANT
GLOVE BIOGEL PI INDICATOR 7.5 (GLOVE) ×2
GOWN STRL REUS W/ TWL LRG LVL3 (GOWN DISPOSABLE) ×1 IMPLANT
GOWN STRL REUS W/ TWL XL LVL3 (GOWN DISPOSABLE) ×1 IMPLANT
GOWN STRL REUS W/TWL LRG LVL3 (GOWN DISPOSABLE) ×2
GOWN STRL REUS W/TWL XL LVL3 (GOWN DISPOSABLE) ×2
GUIDEWIRE STR DUAL SENSOR (WIRE) ×6 IMPLANT
INFUSOR MANOMETER BAG 3000ML (MISCELLANEOUS) ×3 IMPLANT
INTRODUCER DILATOR DOUBLE (INTRODUCER) ×3 IMPLANT
KIT TURNOVER CYSTO (KITS) ×3 IMPLANT
PACK CYSTO AR (MISCELLANEOUS) ×3 IMPLANT
SET CYSTO W/LG BORE CLAMP LF (SET/KITS/TRAYS/PACK) ×3 IMPLANT
SHEATH URETERAL 12FR 45CM (SHEATH) ×3 IMPLANT
SHEATH URETERAL 12FRX35CM (MISCELLANEOUS) IMPLANT
SOL .9 NS 3000ML IRR  AL (IV SOLUTION) ×2
SOL .9 NS 3000ML IRR UROMATIC (IV SOLUTION) ×1 IMPLANT
STENT URET 6FRX24 CONTOUR (STENTS) IMPLANT
STENT URET 6FRX26 CONTOUR (STENTS) IMPLANT
STENT URET 6FRX30 CONTOUR (STENTS) ×3 IMPLANT
SURGILUBE 2OZ TUBE FLIPTOP (MISCELLANEOUS) ×3 IMPLANT
SYR 10ML LL (SYRINGE) ×3 IMPLANT
VALVE UROSEAL ADJ ENDO (VALVE) ×3 IMPLANT
WATER STERILE IRR 1000ML POUR (IV SOLUTION) ×3 IMPLANT

## 2019-05-10 NOTE — Anesthesia Postprocedure Evaluation (Signed)
Anesthesia Post Note  Patient: Christopher Valdez.  Procedure(s) Performed: CYSTOSCOPY/URETEROSCOPY/HOLMIUM LASER/STENT PLACEMENT (Right Ureter) CYSTOSCOPY WITH RETROGRADE PYELOGRAM (Right Ureter)  Patient location during evaluation: PACU Anesthesia Type: General Level of consciousness: awake and alert Pain management: pain level controlled Vital Signs Assessment: post-procedure vital signs reviewed and stable Respiratory status: spontaneous breathing, nonlabored ventilation, respiratory function stable and patient connected to nasal cannula oxygen Cardiovascular status: blood pressure returned to baseline and stable Postop Assessment: no apparent nausea or vomiting Anesthetic complications: no     Last Vitals:  Vitals:   05/10/19 1051 05/10/19 1102  BP: (!) 121/59 (!) 120/54  Pulse: 88 82  Resp: 10 17  Temp:    SpO2: 91% 93%    Last Pain:  Vitals:   05/10/19 1032  TempSrc:   PainSc: Asleep                 [name]

## 2019-05-10 NOTE — Anesthesia Preprocedure Evaluation (Signed)
Anesthesia Evaluation  Patient identified by MRN, date of birth, ID band Patient awake    Reviewed: Allergy & Precautions, NPO status , Patient's Chart, lab work & pertinent test results  History of Anesthesia Complications Negative for: history of anesthetic complications  Airway Mallampati: IV  TM Distance: >3 FB Neck ROM: Full  Mouth opening: Limited Mouth Opening  Dental no notable dental hx. (+) Teeth Intact, Missing, Dental Advisory Given   Pulmonary neg pulmonary ROS, neg sleep apnea, neg COPD, Current Smoker and Patient abstained from smoking.,    Pulmonary exam normal breath sounds clear to auscultation       Cardiovascular Exercise Tolerance: Good METS(-) hypertension(-) CAD and (-) Past MI negative cardio ROS  (-) dysrhythmias  Rhythm:Regular Rate:Normal - Systolic murmurs    Neuro/Psych PSYCHIATRIC DISORDERS Schizophrenia Developmental delay; signs own consents negative psych ROS   GI/Hepatic GERD  Controlled,(+)     (-) substance abuse  ,   Endo/Other  neg diabetes  Renal/GU negative Renal ROS     Musculoskeletal   Abdominal   Peds  Hematology   Anesthesia Other Findings Past Medical History: No date: Calculus of kidney No date: Chronic constipation No date: Dyslipidemia No date: GERD (gastroesophageal reflux disease) No date: Proteinuria No date: Schizo-affective schizophrenia (HCC) No date: Vitamin D deficiency  Reproductive/Obstetrics                             Anesthesia Physical Anesthesia Plan  ASA: II  Anesthesia Plan: General   Post-op Pain Management:    Induction: Intravenous  PONV Risk Score and Plan: 2 and Ondansetron and Dexamethasone  Airway Management Planned: Oral ETT and Video Laryngoscope Planned  Additional Equipment: None  Intra-op Plan:   Post-operative Plan: Extubation in OR  Informed Consent: I have reviewed the patients History  and Physical, chart, labs and discussed the procedure including the risks, benefits and alternatives for the proposed anesthesia with the patient or authorized representative who has indicated his/her understanding and acceptance.     Dental advisory given  Plan Discussed with: CRNA and Surgeon  Anesthesia Plan Comments: (Potential difficult airway. Will plan for modified RSI, video laryngoscope. Bougie and LMA and fiberoptic readily available if need be.  Discussed risks of anesthesia with patient, including PONV, sore throat, lip/dental damage. Rare risks discussed as well, such as cardiorespiratory and neurological sequelae. Patient understands and asks appropriate questions.)        Anesthesia Quick Evaluation

## 2019-05-10 NOTE — H&P (Signed)
UROLOGY H&P UPDATE  Agree with prior H&P dated 20/64.  59 year old male with severe developmental delay and a 1.5 cm very dense right renal pelvis stone causing intermittent obstruction and renal colic.  Cardiac: RRR Lungs: CTA bilaterally  Laterality: Right Procedure: Right ureteroscopy, laser lithotripsy, stent placement  Urine: Culture 4/27 no growth  We specifically discussed the risks ureteroscopy including bleeding, infection/sepsis, stent related symptoms including flank pain/urgency/frequency/incontinence/dysuria, ureteral injury, inability to access stone, or need for staged or additional procedures.   Sondra Come, MD 05/10/2019

## 2019-05-10 NOTE — Anesthesia Procedure Notes (Addendum)
Procedure Name: Intubation Date/Time: 05/10/2019 9:24 AM Performed by: Omer Jack, CRNA Pre-anesthesia Checklist: Patient identified, Patient being monitored, Timeout performed, Emergency Drugs available and Suction available Patient Re-evaluated:Patient Re-evaluated prior to induction Oxygen Delivery Method: Circle system utilized Preoxygenation: Pre-oxygenation with 100% oxygen Induction Type: IV induction Ventilation: Two handed mask ventilation required Laryngoscope Size: McGraph and 4 Grade View: Grade III Tube type: Oral Tube size: 7.5 mm Number of attempts: 1 Airway Equipment and Method: Stylet Placement Confirmation: ETT inserted through vocal cords under direct vision,  positive ETCO2 and breath sounds checked- equal and bilateral Secured at: 23 cm Tube secured with: Tape Dental Injury: Teeth and Oropharynx as per pre-operative assessment  Difficulty Due To: Difficulty was anticipated and Difficult Airway- due to limited oral opening Future Recommendations: Recommend- induction with short-acting agent, and alternative techniques readily available Comments: Pt did not have good mouth opening preop. Limited mouth opening with paralytic as well. Grade 2b- III view.

## 2019-05-10 NOTE — Op Note (Signed)
Date of procedure: 05/10/19  Preoperative diagnosis:  1. Right 1.5 cm renal pelvis stone  Postoperative diagnosis:  1. Same  Procedure: 1. Cystoscopy, right ureteroscopy, laser lithotripsy, stent placement, right retrograde pyelogram with intraoperative interpretation  Surgeon: Legrand Rams, MD  Anesthesia: General  Complications: None  Intraoperative findings:  1.  No abnormal bladder findings 2.  Uncomplicated dusting of large 1.5 cm right renal pelvis stone 3.  Uncomplicated stent placement  EBL: Minimal  Specimens: None  Drains: Right 6 French by 30 cm ureteral stent  Indication: Christopher Valdez. is a 59 y.o. patient with a 1.5 cm right renal pelvis stone and intermittent right renal colic.  After reviewing the management options for treatment, they elected to proceed with the above surgical procedure(s). We have discussed the potential benefits and risks of the procedure, side effects of the proposed treatment, the likelihood of the patient achieving the goals of the procedure, and any potential problems that might occur during the procedure or recuperation. Informed consent has been obtained.  Description of procedure:  The patient was taken to the operating room and general anesthesia was induced. SCDs were placed for DVT prophylaxis. The patient was placed in the dorsal lithotomy position, prepped and draped in the usual sterile fashion, and preoperative antibiotics(Ancef) were administered. A preoperative time-out was performed.   21 French rigid cystoscope was used to intubate the urethra and a normal-appearing urethra was followed proximally into the bladder.  The prostate was moderate in size.  Thorough cystoscopy was performed and there were no abnormal findings, and the ureteral orifice ease were orthotopic bilaterally.  The right ureteral orifice was cannulated with a sensor wire and this advanced easily up into the kidney under fluoroscopic vision.  The stone was  clearly seen on fluoroscopy.  A dual-lumen access catheter was used to gently dilate the ureter and add a second safety wire.  The 12/14 French ureteral access sheath was gently advanced under fluoroscopic vision advanced easily up into the kidney.  A single channel flexible ureteroscope was advanced through the sheath and a large yellow stone was clearly identified in the renal pelvis.  A 200 m laser fiber on settings of 0.6 J and 40 Hz was used to methodically dust the stone until no fragments > 1 mm remained.  Thorough pyeloscopy revealed no residual stones.  Retrograde pyelogram was performed from the proximal ureter and showed no filling defects or extravasation.  Careful pullback ureteroscopy demonstrated no residual fragments or ureteral injury.  The rigid cystoscope was backloaded over the wire and a 6 [place] by 30 cm stent was uneventfully placed under direct vision with an excellent curl in the renal pelvis, as well as under direct vision of the bladder.  There was brisk drainage of urine and contrast through the side ports of the stent.  Bladder was drained.  A belladonna suppository was placed.  Disposition: Stable to PACU  Plan: Bactrim prophylaxis while stent in place Follow-up in 1 week for stent removal in clinic  Legrand Rams, MD

## 2019-05-10 NOTE — Discharge Instructions (Signed)

## 2019-05-10 NOTE — Transfer of Care (Signed)
Immediate Anesthesia Transfer of Care Note  Patient: Christopher Valdez.  Procedure(s) Performed: CYSTOSCOPY/URETEROSCOPY/HOLMIUM LASER/STENT PLACEMENT (Right Ureter) CYSTOSCOPY WITH RETROGRADE PYELOGRAM (Right Ureter)  Patient Location: PACU  Anesthesia Type:General  Level of Consciousness: drowsy and patient cooperative  Airway & Oxygen Therapy: Patient Spontanous Breathing and Patient connected to face mask oxygen  Post-op Assessment: Report given to RN and Post -op Vital signs reviewed and stable  Post vital signs: Reviewed and stable  Last Vitals:  Vitals Value Taken Time  BP 119/96 05/10/19 1035  Temp    Pulse 103 05/10/19 1037  Resp 28 05/10/19 1037  SpO2 99 % 05/10/19 1037  Vitals shown include unvalidated device data.  Last Pain:  Vitals:   05/10/19 0841  TempSrc: Tympanic  PainSc: 3          Complications: No apparent anesthesia complications

## 2019-05-16 ENCOUNTER — Other Ambulatory Visit: Payer: Self-pay

## 2019-05-16 ENCOUNTER — Ambulatory Visit (INDEPENDENT_AMBULATORY_CARE_PROVIDER_SITE_OTHER): Payer: Medicare Other | Admitting: Urology

## 2019-05-16 ENCOUNTER — Encounter: Payer: Self-pay | Admitting: Urology

## 2019-05-16 VITALS — BP 125/69 | HR 90 | Ht 77.0 in | Wt 173.0 lb

## 2019-05-16 DIAGNOSIS — Z466 Encounter for fitting and adjustment of urinary device: Secondary | ICD-10-CM

## 2019-05-16 MED ORDER — LIDOCAINE HCL URETHRAL/MUCOSAL 2 % EX GEL
1.0000 "application " | Freq: Once | CUTANEOUS | Status: AC
  Administered 2019-05-16: 1 via URETHRAL

## 2019-05-16 NOTE — Progress Notes (Signed)
Cystoscopy Procedure Note:  Indication: Stent removal s/p right ureteroscopy for 1.5 cm renal pelvis stone  After informed consent and discussion of the procedure and its risks, Christopher Amour. was positioned and prepped in the standard fashion. Cystoscopy was performed with a flexible cystoscope. The stent was grasped with flexible graspers and removed in its entirety. The patient tolerated the procedure well.  Findings: Uncomplicated stent removal  Assessment and Plan: Follow up in 6 weeks with renal ultrasound to evaluate for silent hydronephrosis  Sondra Come, MD 05/16/2019

## 2019-05-16 NOTE — Patient Instructions (Signed)

## 2019-05-17 LAB — URINALYSIS, COMPLETE
Bilirubin, UA: NEGATIVE
Glucose, UA: NEGATIVE
Nitrite, UA: NEGATIVE
Specific Gravity, UA: 1.02 (ref 1.005–1.030)
Urobilinogen, Ur: 1 mg/dL (ref 0.2–1.0)
pH, UA: 7 (ref 5.0–7.5)

## 2019-05-17 LAB — MICROSCOPIC EXAMINATION
Bacteria, UA: NONE SEEN
RBC, Urine: >30 /hpf — AB (ref 0–2)

## 2019-05-29 DIAGNOSIS — Z79899 Other long term (current) drug therapy: Secondary | ICD-10-CM | POA: Diagnosis not present

## 2019-06-20 ENCOUNTER — Other Ambulatory Visit: Payer: Self-pay | Admitting: Family Medicine

## 2019-06-20 NOTE — Telephone Encounter (Signed)
Requested medication (s) are due for refill today: yes  Requested medication (s) are on the active medication list:yes   Last refill: 01/01/19  #30  5 refills  Future visit scheduled     yes 06/26/2019    Notes to clinic: Not delegated  Requested Prescriptions  Pending Prescriptions Disp Refills   Cholecalciferol (VITAMIN D3) 50 MCG (2000 UT) capsule [Pharmacy Med Name: VITAMIN D3 50 MCG (2000 UT) CAP] 30 capsule 5    Sig: TAKE 1 CAPSULE BY MOUTH DAILY      Endocrinology:  Vitamins - Vitamin D Supplementation Failed - 06/20/2019 12:37 PM      Failed - 50,000 IU strengths are not delegated      Failed - Phosphate in normal range and within 360 days    No results found for: PHOS        Failed - Vitamin D in normal range and within 360 days    Vit D, 25-Hydroxy  Date Value Ref Range Status  11/02/2016 46 30 - 100 ng/mL Final    Comment:    Vitamin D Status         25-OH Vitamin D: . Deficiency:                    <20 ng/mL Insufficiency:             20 - 29 ng/mL Optimal:                 > or = 30 ng/mL . For 25-OH Vitamin D testing on patients on  D2-supplementation and patients for whom quantitation  of D2 and D3 fractions is required, the QuestAssureD(TM) 25-OH VIT D, (D2,D3), LC/MS/MS is recommended: order  code 09381 (patients >66yrs). . For more information on this test, go to: http://education.questdiagnostics.com/faq/FAQ163 (This link is being provided for  informational/educational purposes only.)           Passed - Ca in normal range and within 360 days    Calcium  Date Value Ref Range Status  10/09/2018 9.6 8.6 - 10.3 mg/dL Final          Passed - Valid encounter within last 12 months    Recent Outpatient Visits           2 months ago Hydronephrosis of right kidney   [facility] Alba Cory, MD   8 months ago Dyslipidemia   [facility] Alba Cory, MD   1 year ago GERD without esophagitis   [facility] Alba Cory, MD   1 year ago Microalbuminuria   [facility] Alba Cory, MD   1 year ago Medicare annual wellness visit, subsequent   [facility] Alba Cory, MD       Future Appointments             In 6 days Alba Cory, MD [facility]   In 9 months  [facility]

## 2019-06-26 ENCOUNTER — Ambulatory Visit (INDEPENDENT_AMBULATORY_CARE_PROVIDER_SITE_OTHER): Payer: Medicare Other | Admitting: Family Medicine

## 2019-06-26 ENCOUNTER — Encounter: Payer: Self-pay | Admitting: Family Medicine

## 2019-06-26 ENCOUNTER — Other Ambulatory Visit: Payer: Self-pay

## 2019-06-26 VITALS — BP 146/86 | HR 93 | Temp 98.7°F | Resp 18 | Ht 77.0 in | Wt 174.4 lb

## 2019-06-26 DIAGNOSIS — Z87442 Personal history of urinary calculi: Secondary | ICD-10-CM | POA: Diagnosis not present

## 2019-06-26 DIAGNOSIS — R809 Proteinuria, unspecified: Secondary | ICD-10-CM

## 2019-06-26 DIAGNOSIS — J410 Simple chronic bronchitis: Secondary | ICD-10-CM | POA: Diagnosis not present

## 2019-06-26 DIAGNOSIS — R739 Hyperglycemia, unspecified: Secondary | ICD-10-CM

## 2019-06-26 DIAGNOSIS — K219 Gastro-esophageal reflux disease without esophagitis: Secondary | ICD-10-CM

## 2019-06-26 DIAGNOSIS — E785 Hyperlipidemia, unspecified: Secondary | ICD-10-CM | POA: Diagnosis not present

## 2019-06-26 DIAGNOSIS — F258 Other schizoaffective disorders: Secondary | ICD-10-CM

## 2019-06-26 DIAGNOSIS — E559 Vitamin D deficiency, unspecified: Secondary | ICD-10-CM

## 2019-06-26 DIAGNOSIS — K5909 Other constipation: Secondary | ICD-10-CM

## 2019-06-26 DIAGNOSIS — I10 Essential (primary) hypertension: Secondary | ICD-10-CM

## 2019-06-26 DIAGNOSIS — D638 Anemia in other chronic diseases classified elsewhere: Secondary | ICD-10-CM

## 2019-06-26 MED ORDER — LISINOPRIL 10 MG PO TABS: 10.0000 mg | ORAL_TABLET | Freq: Every day | ORAL | 2 refills | Status: DC

## 2019-06-26 NOTE — Progress Notes (Signed)
Name: Christopher Valdez.   MRN: [id number]    DOB: 1960-08-09   Date:06/26/2019       Progress Note  Subjective  Chief Complaint  Chief Complaint  Patient presents with  . Follow-up    HPI  He lives at [facility], he drove by himself to the office visit today,he continue to work at [employer] on Sundays.   Anemia: normal colonoscopy in 2014,HCT stable, hemoccult stools done end of 2019 were negative, last HCT stable, he has microalbuminuria, likely anemia of chronic disease and we will check it yearly   Dyslipidemia: he takes Simvastatin daily and denies side effects. No chest pain or decrease in exercise tolerance.no myalgias. Recheck labs on his next visit.   GERD: he has been taking Omeprazole, denies heartburn, abdominal painor regurgitationrecently, discussed long term use of PPI, he is not sure if he wants to stop medication.No symptoms in a little while   Smoker's cough: he started smoking at age 18, he smokes about 0.75 pack per day. He states cough can be in am's or at night, no wheezing or SOB  Schizoaffective Disorder: he lives in a group home, has been stable on medications. Able to driveand work.He sees Teacher, music - goes to [employer] -Dr. Sherlynn Stalls , also sees a therapist. He denies side effects of medication. He denies problems sleeping, no hallucinations He is happy he was able to see his dad after over one year.   Constipation: he has a bowel movement daily and sometimes twice daily but denies loose, taking Fibercon with good control of symptoms. Unchanged   Hyperglycemia: Hedenies polyphagia, polyuria or polydipsia. Last hgbA1C was back to normal down from 5.7 % to 5.4 %, we will recheck labs yearly   HTN/proteinuria :it was high inMarch 2016, he was placed onACE,and itnormalized but went to 33 back October 2018and up to 53 on  09/2017 he wasseen by Dr. Candiss Norse, had US kidney that showed right kidney hydronephrosis and also stone,  his bp was low on his last visit and we went down on dose of lisinopril from 10 to 5 mg, today when he arrived bp was slightly elevated and stayed high, at home his bp also been high in the 140-166/70's-80's usually 140's/70's, we will go back on 10 mg of lisinopril daily and continue to monitor . Reviewing his medication list looks like he is not taking any medication for bp at this time  Nephrolithiasis: seen by Dr. Diamantina Providence and had cystoscopy, ureteroscopy, holmium laser and stend placement done on right side on 05/10/2019 , he went back on 05/16/2019 for removal of stent and is doing well since    Patient Active Problem List   Diagnosis Date Noted  . Smokers' cough ([place]) 04/09/2019  . Hydronephrosis of right kidney 04/03/2018  . Right kidney stone 04/03/2018  . Avitaminosis D 10/29/2014  . Enlarged prostate 10/29/2014  . Other schizoaffective disorders ([place]) 10/10/2014  . Proteinuria 10/10/2014  . Hyperglycemia 10/10/2014  . Dyslipidemia 10/10/2014  . History of kidney stones 10/10/2014  . External hemorrhoid 10/10/2014  . Chronic constipation 10/10/2014  . Tobacco use 10/10/2014  . GERD without esophagitis 10/10/2014    Past Surgical History:  Procedure Laterality Date  . COLONOSCOPY  07/02/2012  . CYSTOSCOPY W/ RETROGRADES Right 05/10/2019   Procedure: CYSTOSCOPY WITH RETROGRADE PYELOGRAM;  Surgeon: Billey Co, MD;  Location: [facility] ORS;  Service: Urology;  Laterality: Right;  . CYSTOSCOPY/URETEROSCOPY/HOLMIUM LASER/STENT PLACEMENT Right 05/10/2019   Procedure: CYSTOSCOPY/URETEROSCOPY/HOLMIUM LASER/STENT  PLACEMENT;  Surgeon: Sondra Come, MD;  Location: [facility] ORS;  Service: Urology;  Laterality: Right;    Family History  Problem Relation Age of Onset  . Parkinson's disease Mother   . Deep vein thrombosis Father   . Osteoarthritis Father     Social History   Tobacco Use  . Smoking status: Current Every Day Smoker    Packs/day: 1.00    Years: 40.00    Pack years:  40.00    Types: Cigarettes  . Smokeless tobacco: Never Used  Substance Use Topics  . Alcohol use: No    Alcohol/week: 0.0 standard drinks     Current Outpatient Medications:  .  acetaminophen (TYLENOL) 500 MG tablet, Take 1,000 mg by mouth every 4 (four) hours as needed for mild pain or headache. , Disp: , Rfl:  .  bismuth subsalicylate (PEPTO BISMOL) 262 MG chewable tablet, Chew 524 mg by mouth daily as needed for indigestion. , Disp: , Rfl:  .  Cholecalciferol (VITAMIN D3) 50 MCG (2000 UT) capsule, Take 1 capsule (2,000 Units total) by mouth daily., Disp: 30 capsule, Rfl: 12 .  cloZAPine (CLOZARIL) 100 MG tablet, Take 200 mg by mouth 2 (two) times daily. , Disp: , Rfl:  .  divalproex (DEPAKOTE ER) 500 MG 24 hr tablet, Take 500 mg by mouth at bedtime. , Disp: , Rfl:  .  FLUoxetine (PROZAC) 40 MG capsule, Take 40 mg by mouth daily., Disp: , Rfl:  .  omeprazole (PRILOSEC) 40 MG capsule, TAKE ONE CAPSULE BY MOUTH EVERY MORNING FOR REFLUX (Patient taking differently: Take 40 mg by mouth daily. ), Disp: 30 capsule, Rfl: 5 .  polycarbophil (FIBERCON) 625 MG tablet, Take 625 mg by mouth daily., Disp: , Rfl:  .  simvastatin (ZOCOR) 10 MG tablet, Take 1 tablet (10 mg total) by mouth daily at 6 PM., Disp: 30 tablet, Rfl: 12 .  sulfamethoxazole-trimethoprim (BACTRIM DS) 800-160 MG tablet, Take 1 tablet by mouth daily., Disp: 10 tablet, Rfl: 0 .  tamsulosin (FLOMAX) 0.4 MG CAPS capsule, Take 1 capsule (0.4 mg total) by mouth daily after supper., Disp: 14 capsule, Rfl: 0  Allergies  Allergen Reactions  . Tall Ragweed Other (See Comments)    Pollen    I personally reviewed active problem list, medication list, allergies, family history, social history, health maintenance with the patient/caregiver today.   ROS  Constitutional: Negative for fever or weight change.  Respiratory: Negative for cough and shortness of breath.   Cardiovascular: Negative for chest pain or palpitations.   Gastrointestinal: Negative for abdominal pain, no bowel changes.  Musculoskeletal: Negative for gait problem or joint swelling.  Skin: Negative for rash.  Neurological: Negative for dizziness or headache.  No other specific complaints in a complete review of systems (except as listed in HPI above).  Objective  Vitals:   06/26/19 1339  BP: (!) 146/86  Pulse: 93  Resp: 18  Temp: 98.7 F (37.1 C)  TempSrc: Temporal  SpO2: 94%  Weight: 174 lb 6.4 oz (79.1 kg)  Height: 6\' 5"  (1.956 m)    Body mass index is 20.68 kg/m.  Physical Exam  Constitutional: Patient appears well-developed and well-nourished.  No distress.  HEENT: head atraumatic, normocephalic, pupils equal and reactive to light, neck supple Cardiovascular: Normal rate, regular rhythm and normal heart sounds.  No murmur heard. No BLE edema. Pulmonary/Chest: Effort normal and breath sounds normal. No respiratory distress. Abdominal: Soft.  There is no tenderness. Negative CVA tenderness  Psychiatric: Patient  has a normal mood and affect. behavior is normal. Judgment and thought content normal.  Recent Results (from the past 2160 hour(s))  Urinalysis, Complete w Microscopic (For [facility] ONLY)     Status: Abnormal   Collection Time: 05/07/19 12:09 PM  Result Value Ref Range   Color, Urine YELLOW YELLOW   APPearance CLEAR CLEAR   Specific Gravity, Urine 1.020 1.005 - 1.030   pH 7.0 5.0 - 8.0   Glucose, UA NEGATIVE NEGATIVE mg/dL   Hgb urine dipstick TRACE (A) NEGATIVE   Bilirubin Urine NEGATIVE NEGATIVE   Ketones, ur NEGATIVE NEGATIVE mg/dL   Protein, ur NEGATIVE NEGATIVE mg/dL   Nitrite NEGATIVE NEGATIVE   Leukocytes,Ua NEGATIVE NEGATIVE   Squamous Epithelial / LPF 0-5 0 - 5   WBC, UA 0-5 0 - 5 WBC/hpf   RBC / HPF 11-20 0 - 5 RBC/hpf   Bacteria, UA FEW (A) NONE SEEN   Mucus PRESENT     Comment: Performed at [facility], [address], [place], [place] [address]  Urine Culture     Status: None    Collection Time: 05/07/19 12:09 PM   Specimen: Urine, Clean Catch  Result Value Ref Range   Specimen Description      URINE, CLEAN CATCH Performed at [facility], [address] [place], [place] [address]    Special Requests      NONE Performed at [facility], [address] [place], [place] [address]    Culture      NO GROWTH Performed at [facility], [address] [place], [place] [address]    Report Status 05/08/2019 FINAL   Respiratory Panel by RT PCR (Flu A&B, Covid) - Nasopharyngeal Swab     Status: None   Collection Time: 05/10/19  7:57 AM   Specimen: Nasopharyngeal Swab  Result Value Ref Range   SARS Coronavirus 2 by RT PCR NEGATIVE NEGATIVE    Comment: (NOTE) SARS-CoV-2 target nucleic acids are NOT DETECTED. The SARS-CoV-2 RNA is generally detectable in upper respiratoy specimens during the acute phase of infection. The lowest concentration of SARS-CoV-2 viral copies this assay can detect is 131 copies/mL. A negative result does not preclude SARS-Cov-2 infection and should not be used as the sole basis for treatment or other patient management decisions. A negative result may occur with  improper specimen collection/handling, submission of specimen other than nasopharyngeal swab, presence of viral mutation(s) within the areas targeted by this assay, and inadequate number of viral copies (<131 copies/mL). A negative result must be combined with clinical observations, patient history, and epidemiological information. The expected result is Negative. Fact Sheet for Patients:  https://www.moore.com/ Fact Sheet for Healthcare Providers:  https://www.young.biz/ This test is not yet ap proved or cleared by the [place] FDA and  has been authorized for detection and/or diagnosis of SARS-CoV-2 by FDA under an Emergency Use Authorization (EUA). This EUA will remain  in effect (meaning this  test can be used) for the duration of the COVID-19 declaration under Section 564(b)(1) of the Act, 21 U.S.C. section 360bbb-3(b)(1), unless the authorization is terminated or revoked sooner.    Influenza A by PCR NEGATIVE NEGATIVE   Influenza B by PCR NEGATIVE NEGATIVE    Comment: (NOTE) The Xpert Xpress SARS-CoV-2/FLU/RSV assay is intended as an aid in  the diagnosis of influenza from Nasopharyngeal swab specimens and  should not be used as a sole basis for treatment. Nasal washings and  aspirates are  unacceptable for Xpert Xpress SARS-CoV-2/FLU/RSV  testing. Fact Sheet for Patients: https://www.moore.com/ Fact Sheet for Healthcare Providers: https://www.young.biz/ This test is not yet approved or cleared by the [place] FDA and  has been authorized for detection and/or diagnosis of SARS-CoV-2 by  FDA under an Emergency Use Authorization (EUA). This EUA will remain  in effect (meaning this test can be used) for the duration of the  Covid-19 declaration under Section 564(b)(1) of the Act, 21  U.S.C. section 360bbb-3(b)(1), unless the authorization is  terminated or revoked. Performed at [facility], [address] [place], [place] [address]   Urinalysis, Complete     Status: Abnormal   Collection Time: 05/16/19  2:13 PM  Result Value Ref Range   Specific Gravity, UA 1.020 1.005 - 1.030   pH, UA 7.0 5.0 - 7.5   Color, UA Yellow Yellow   Appearance Ur Cloudy (A) Clear   Leukocytes,UA 2+ (A) Negative   Protein,UA 3+ (A) Negative/Trace   Glucose, UA Negative Negative   Ketones, UA Trace (A) Negative   RBC, UA 3+ (A) Negative   Bilirubin, UA Negative Negative   Urobilinogen, Ur 1.0 0.2 - 1.0 mg/dL   Nitrite, UA Negative Negative   Microscopic Examination See below:   Microscopic Examination     Status: Abnormal   Collection Time: 05/16/19  2:13 PM   URINE  Result Value Ref Range   WBC, UA 11-30 (A) 0 - 5 /hpf   RBC >30  (A) 0 - 2 /hpf   Epithelial Cells (non renal) 0-10 0 - 10 /hpf   Casts Present (A) None seen /lpf   Cast Type Hyaline casts N/A   Bacteria, UA None seen None seen/Few     PHQ2/9: Depression screen [facility] 2/9 06/26/2019 04/09/2019 04/09/2019 10/09/2018 04/03/2018  Decreased Interest 0 0 0 0 0  Down, Depressed, Hopeless 0 0 0 0 0  PHQ - 2 Score 0 0 0 0 0  Altered sleeping 0 - 0 0 0  Tired, decreased energy 0 - 0 0 1  Change in appetite 0 - 0 0 0  Feeling bad or failure about yourself  0 - 0 0 0  Trouble concentrating 3 - 0 0 1  Moving slowly or fidgety/restless 0 - 0 0 0  Suicidal thoughts 0 - 0 0 0  PHQ-9 Score 3 - 0 0 2  Difficult doing work/chores Not difficult at all - - - Not difficult at all  Some recent data might be hidden    phq 9 is negative   Fall Risk: Fall Risk  06/26/2019 04/09/2019 04/09/2019 10/09/2018 04/03/2018  Falls in the past year? 0 0 0 0 0  Number falls in past yr: - 0 0 0 0  Injury with Fall? - 0 0 0 0  Risk for fall due to : - No Fall Risks - - -  Follow up - Falls prevention discussed - - -    Functional Status Survey: Is the patient deaf or have difficulty hearing?: No Does the patient have difficulty seeing, even when wearing glasses/contacts?: No Does the patient have difficulty concentrating, remembering, or making decisions?: Yes Does the patient have difficulty walking or climbing stairs?: No Does the patient have difficulty dressing or bathing?: No Does the patient have difficulty doing errands alone such as visiting a doctor's office or shopping?: No    Assessment & Plan  1. Smokers' cough (HCC)  Discussed importance of quitting smoking   2. Other schizoaffective  disorders (HCC)  Keep follow up with psychiatrist   3. Vitamin D deficiency  Continues supplementation   4. History of kidney stones  Removed Aprl 2021 and doing well now  5. Dyslipidemia  Continue simvastatin, no side effects of medication  6. Microalbuminuria  On ACE    7. Essential hypertension  bp has been high, we will resume lisinopril 10 mg and monitor at home, may need to go down to 5 mg, but needs at least some coverage since bp is spiking at home and average 140's/70-80's  8. GERD without esophagitis  Stable taking medication daily   9. Anemia of chronic disease  Reviewed labs, recheck next visit   10. Hyperglycemia  Recheck yearly   11. Chronic constipation  stable

## 2019-07-03 DIAGNOSIS — Z79899 Other long term (current) drug therapy: Secondary | ICD-10-CM | POA: Diagnosis not present

## 2019-07-04 ENCOUNTER — Other Ambulatory Visit: Payer: Self-pay

## 2019-07-04 DIAGNOSIS — Z87442 Personal history of urinary calculi: Secondary | ICD-10-CM

## 2019-07-09 ENCOUNTER — Other Ambulatory Visit: Payer: Self-pay

## 2019-07-09 ENCOUNTER — Ambulatory Visit
Admission: RE | Admit: 2019-07-09 | Discharge: 2019-07-09 | Disposition: A | Discharge status: Home / Self Care | Payer: Medicare Other | Source: Ambulatory Visit | Attending: Urology | Admitting: Urology

## 2019-07-09 DIAGNOSIS — Z87442 Personal history of urinary calculi: Secondary | ICD-10-CM | POA: Diagnosis not present

## 2019-07-09 DIAGNOSIS — N3289 Other specified disorders of bladder: Secondary | ICD-10-CM | POA: Diagnosis not present

## 2019-07-09 DIAGNOSIS — Q600 Renal agenesis, unilateral: Secondary | ICD-10-CM | POA: Diagnosis not present

## 2019-07-09 DIAGNOSIS — N1330 Unspecified hydronephrosis: Secondary | ICD-10-CM | POA: Diagnosis not present

## 2019-07-09 DIAGNOSIS — N132 Hydronephrosis with renal and ureteral calculous obstruction: Secondary | ICD-10-CM | POA: Diagnosis not present

## 2019-07-10 ENCOUNTER — Ambulatory Visit (INDEPENDENT_AMBULATORY_CARE_PROVIDER_SITE_OTHER): Payer: Medicare Other | Admitting: Urology

## 2019-07-10 ENCOUNTER — Other Ambulatory Visit: Payer: Self-pay

## 2019-07-10 ENCOUNTER — Encounter: Payer: Self-pay | Admitting: Urology

## 2019-07-10 VITALS — BP 131/62 | HR 96 | Ht 77.0 in | Wt 174.0 lb

## 2019-07-10 DIAGNOSIS — N200 Calculus of kidney: Secondary | ICD-10-CM

## 2019-07-10 DIAGNOSIS — Z87442 Personal history of urinary calculi: Secondary | ICD-10-CM

## 2019-07-10 NOTE — Patient Instructions (Signed)
Dietary Guidelines to Help Prevent Kidney Stones Kidney stones are deposits of minerals and salts that form inside your kidneys. Your risk of developing kidney stones may be greater depending on your diet, your lifestyle, the medicines you take, and whether you have certain medical conditions. Most people can reduce their chances of developing kidney stones by following the instructions below. Depending on your overall health and the type of kidney stones you tend to develop, your dietitian may give you more specific instructions. What are tips for following this plan? Reading food labels  Choose foods with "no salt added" or "low-salt" labels. Limit your sodium intake to less than 1500 mg per day.  Choose foods with calcium for each meal and snack. Try to eat about 300 mg of calcium at each meal. Foods that contain 200-500 mg of calcium per serving include: ? 8 oz (237 ml) of milk, fortified nondairy milk, and fortified fruit juice. ? 8 oz (237 ml) of kefir, yogurt, and soy yogurt. ? 4 oz (118 ml) of tofu. ? 1 oz of cheese. ? 1 cup (300 g) of dried figs. ? 1 cup (91 g) of cooked broccoli. ? 1-3 oz can of sardines or mackerel.  Most people need 1000 to 1500 mg of calcium each day. Talk to your dietitian about how much calcium is recommended for you. Shopping  Buy plenty of fresh fruits and vegetables. Most people do not need to avoid fruits and vegetables, even if they contain nutrients that may contribute to kidney stones.  When shopping for convenience foods, choose: ? Whole pieces of fruit. ? Premade salads with dressing on the side. ? Low-fat fruit and yogurt smoothies.  Avoid buying frozen meals or prepared deli foods.  Look for foods with live cultures, such as yogurt and kefir. Cooking  Do not add salt to food when cooking. Place a salt shaker on the table and allow each person to add his or her own salt to taste.  Use vegetable protein, such as beans, textured vegetable  protein (TVP), or tofu instead of meat in pasta, casseroles, and soups. Meal planning   Eat less salt, if told by your dietitian. To do this: ? Avoid eating processed or premade food. ? Avoid eating fast food.  Eat less animal protein, including cheese, meat, poultry, or fish, if told by your dietitian. To do this: ? Limit the number of times you have meat, poultry, fish, or cheese each week. Eat a diet free of meat at least 2 days a week. ? Eat only one serving each day of meat, poultry, fish, or seafood. ? When you prepare animal protein, cut pieces into small portion sizes. For most meat and fish, one serving is about the size of one deck of cards.  Eat at least 5 servings of fresh fruits and vegetables each day. To do this: ? Keep fruits and vegetables on hand for snacks. ? Eat 1 piece of fruit or a handful of berries with breakfast. ? Have a salad and fruit at lunch. ? Have two kinds of vegetables at dinner.  Limit foods that are high in a substance called oxalate. These include: ? Spinach. ? Rhubarb. ? Beets. ? Potato chips and french fries. ? Nuts.  If you regularly take a diuretic medicine, make sure to eat at least 1-2 fruits or vegetables high in potassium each day. These include: ? Avocado. ? Banana. ? Orange, prune, carrot, or tomato juice. ? Baked potato. ? Cabbage. ? Beans and split   peas. General instructions   Drink enough fluid to keep your urine clear or pale yellow. This is the most important thing you can do.  Talk to your health care provider and dietitian about taking daily supplements. Depending on your health and the cause of your kidney stones, you may be advised: ? Not to take supplements with vitamin C. ? To take a calcium supplement. ? To take a daily probiotic supplement. ? To take other supplements such as magnesium, fish oil, or vitamin B6.  Take all medicines and supplements as told by your health care provider.  Limit alcohol intake to no  more than 1 drink a day for nonpregnant women and 2 drinks a day for men. One drink equals 12 oz of beer, 5 oz of wine, or 1 oz of hard liquor.  Lose weight if told by your health care provider. Work with your dietitian to find strategies and an eating plan that works best for you. What foods are not recommended? Limit your intake of the following foods, or as told by your dietitian. Talk to your dietitian about specific foods you should avoid based on the type of kidney stones and your overall health. Grains Breads. Bagels. Rolls. Baked goods. Salted crackers. Cereal. Pasta. Vegetables Spinach. Rhubarb. Beets. Canned vegetables. Pickles. Olives. Meats and other protein foods Nuts. Nut butters. Large portions of meat, poultry, or fish. Salted or cured meats. Deli meats. Hot dogs. Sausages. Dairy Cheese. Beverages Regular soft drinks. Regular vegetable juice. Seasonings and other foods Seasoning blends with salt. Salad dressings. Canned soups. Soy sauce. Ketchup. Barbecue sauce. Canned pasta sauce. Casseroles. Pizza. Lasagna. Frozen meals. Potato chips. French fries. Summary  You can reduce your risk of kidney stones by making changes to your diet.  The most important thing you can do is drink enough fluid. You should drink enough fluid to keep your urine clear or pale yellow.  Ask your health care provider or dietitian how much protein from animal sources you should eat each day, and also how much salt and calcium you should have each day. This information is not intended to replace advice given to you by your health care provider. Make sure you discuss any questions you have with your health care provider. Document Revised: 04/18/2018 Document Reviewed: 12/08/2015 Elsevier Patient Education  2020 [employer]vier Inc[employer]  

## 2019-07-10 NOTE — Progress Notes (Signed)
   07/10/2019 4:13 PM   Christopher Valdez. 03-Jan-1961 [id number]  Reason for visit: Follow up nephrolithiasis, right hydronephrosis  HPI: I saw Christopher Valdez back in urology clinic today for follow-up.  To briefly summarize, he is a 59 year old male who lives in a group home for schizoaffective disorder who had had a 1.5 cm right UPJ stone with hydronephrosis for at least 10 years before being referred to urology.  He underwent uncomplicated right ureteroscopy and laser lithotripsy of a 1.5 cm UPJ stone on 05/10/2019, and subsequent stent removal 1 week later.  He reports that his right flank pain that he was having prior to surgery is completely resolved, and he denies any complaints today.  Follow-up renal ultrasound shows some persistent mild right pelviectasis which is not surprising in the setting of his stone being present for over 10 years prior to treatment.  I recommended follow-up in 3 months with a repeat renal ultrasound to evaluate for any worsening of his chronic right hydronephrosis, and to check his renal function with a BMP.   Sondra Come, MD  [facility] [address], [address] [place], [place] [address] [phone]

## 2019-07-31 DIAGNOSIS — Z79899 Other long term (current) drug therapy: Secondary | ICD-10-CM | POA: Diagnosis not present

## 2019-09-11 DIAGNOSIS — R05 Cough: Secondary | ICD-10-CM | POA: Diagnosis not present

## 2019-09-18 ENCOUNTER — Other Ambulatory Visit: Payer: Self-pay | Admitting: Family Medicine

## 2019-09-18 DIAGNOSIS — I10 Essential (primary) hypertension: Secondary | ICD-10-CM

## 2019-09-27 NOTE — Progress Notes (Signed)
Name: Christopher Valdez.   MRN: [id number]    DOB: 1960-06-25   Date:10/01/2019       Progress Note  Subjective  Chief Complaint  Chief Complaint  Patient presents with  . Follow-up  . Cough  . Gastroesophageal Reflux    HPI  He lives at [facility], he drove by himself to the office visit today,he continue to work at [employer] on Sundays.    Anemia: normal colonoscopy in 2014,HCT stable, hemoccult stools done end of 2019 were negative, last HCT stable, he has microalbuminuria, likely anemia of chronic disease and we will recheck it today   Dyslipidemia: he takes Simvastatin daily and denies side effects. No chest pain or decrease in exercise tolerance.no myalgias. We will recheck labs today   GERD: he has been taking Omeprazole, denies heartburn, abdominal painor regurgitationrecently, discussed long term use of PPI, he is not sure if he wants to stop medication.He states symptoms controlled   Smoker's cough: he started smoking at age 59, he smokes about 0.75 pack per day. He states cough can be in am's or at night, no wheezing or SOB, he states usually dry.   Schizoaffective Disorder: he lives in a group home, has been stable on medications. Able to driveand work.He sees Therapist, sports - goes to [facility] -Dr. Rogers Blocker , also sees a therapist. He denies side effects of medication. He denies problems sleeping, no hallucinations He likes his job at [employer]  Constipation: he has a bowel movement daily and sometimes twice daily but denies loose, taking Fibercon with good control of symptoms. Unchanged   Hyperglycemia: Hedenies polyphagia, polyuria or polydipsia. Last hgbA1C was back to normal down from 5.7 % to 5.4 %, we will recheck labs today   HTN/proteinuria :it was high inMarch 2016, he was placed onACE,and proteinuria resolved it went down  33 back October 2018and up to 53 on  09/2017 he wasseen by Dr. Thedore Mins, had US kidney that showed right kidney  hydronephrosis and also stone, his bp was low on his last visit and we went down on dose of lisinopril from 10 to 5 mg, but bp was getting higher again and is back on lisinopril 10 mg today bp is back to goal.   Nephrolithiasis: seen by Dr. Richardo Hanks and had cystoscopy, ureteroscopy,  laser and stent placement done on right side on 05/10/2019 , he went back on 05/16/2019 for removal of stent and is doing well since . Unchanged   Laceration index finger: it happened two days ago, it bleed, he is not sure how it happened, but it was at home and not at work. Bleeding stopped with pressure   Patient Active Problem List   Diagnosis Date Noted  . Smokers' cough (HCC) 04/09/2019  . Hydronephrosis of right kidney 04/03/2018  . Right kidney stone 04/03/2018  . Avitaminosis D 10/29/2014  . Enlarged prostate 10/29/2014  . Other schizoaffective disorders (HCC) 10/10/2014  . Proteinuria 10/10/2014  . Hyperglycemia 10/10/2014  . Dyslipidemia 10/10/2014  . History of kidney stones 10/10/2014  . External hemorrhoid 10/10/2014  . Chronic constipation 10/10/2014  . Tobacco use 10/10/2014  . GERD without esophagitis 10/10/2014    Past Surgical History:  Procedure Laterality Date  . COLONOSCOPY  07/02/2012  . CYSTOSCOPY W/ RETROGRADES Right 05/10/2019   Procedure: CYSTOSCOPY WITH RETROGRADE PYELOGRAM;  Surgeon: Sondra Come, MD;  Location: [facility] ORS;  Service: Urology;  Laterality: Right;  . CYSTOSCOPY/URETEROSCOPY/HOLMIUM LASER/STENT PLACEMENT Right 05/10/2019   Procedure: CYSTOSCOPY/URETEROSCOPY/HOLMIUM  LASER/STENT PLACEMENT;  Surgeon: Sondra Come, MD;  Location: [facility] ORS;  Service: Urology;  Laterality: Right;    Family History  Problem Relation Age of Onset  . Parkinson's disease Mother   . Deep vein thrombosis Father   . Osteoarthritis Father     Social History   Tobacco Use  . Smoking status: Current Every Day Smoker    Packs/day: 1.00    Years: 40.00    Pack years: 40.00     Types: Cigarettes  . Smokeless tobacco: Never Used  Substance Use Topics  . Alcohol use: No    Alcohol/week: 0.0 standard drinks     Current Outpatient Medications:  .  acetaminophen (TYLENOL) 500 MG tablet, Take 1,000 mg by mouth every 4 (four) hours as needed for mild pain or headache. , Disp: , Rfl:  .  Cholecalciferol (VITAMIN D3) 50 MCG (2000 UT) capsule, Take 1 capsule (2,000 Units total) by mouth daily., Disp: 30 capsule, Rfl: 12 .  cloZAPine (CLOZARIL) 100 MG tablet, Take 200 mg by mouth 2 (two) times daily. , Disp: , Rfl:  .  divalproex (DEPAKOTE ER) 500 MG 24 hr tablet, Take 500 mg by mouth at bedtime. , Disp: , Rfl:  .  FLUoxetine (PROZAC) 40 MG capsule, Take 40 mg by mouth daily., Disp: , Rfl:  .  lisinopril (ZESTRIL) 10 MG tablet, TAKE 1 TABLET BY MOUTH DAILY, Disp: 30 tablet, Rfl: 0 .  omeprazole (PRILOSEC) 40 MG capsule, TAKE ONE CAPSULE BY MOUTH EVERY MORNING FOR REFLUX (Patient taking differently: Take 40 mg by mouth daily. ), Disp: 30 capsule, Rfl: 5 .  polycarbophil (FIBERCON) 625 MG tablet, Take 625 mg by mouth daily., Disp: , Rfl:  .  simvastatin (ZOCOR) 10 MG tablet, Take 1 tablet (10 mg total) by mouth daily at 6 PM., Disp: 30 tablet, Rfl: 12  Allergies  Allergen Reactions  . Tall Ragweed Other (See Comments)    Pollen    I personally reviewed active problem list, medication list, allergies, family history, social history, health maintenance with the patient/caregiver today.   ROS  Constitutional: Negative for fever or weight change.  Respiratory: Negative for cough and shortness of breath.   Cardiovascular: Negative for chest pain or palpitations.  Gastrointestinal: Negative for abdominal pain, no bowel changes.  Musculoskeletal: Negative for gait problem or joint swelling.  Skin: Negative for rash.  Neurological: Negative for dizziness or headache.  No other specific complaints in a complete review of systems (except as listed in HPI  above).  Objective  Vitals:   10/01/19 1308  BP: 128/66  Pulse: 99  Resp: 18  Temp: 98.7 F (37.1 C)  TempSrc: Oral  SpO2: 100%  Weight: 177 lb 12.8 oz (80.6 kg)  Height: 6\' 5"  (1.956 m)    Body mass index is 21.08 kg/m.  Physical Exam  Constitutional: Patient appears well-developed and well-nourished.  No distress.  HEENT: head atraumatic, normocephalic, pupils equal and reactive to light, neck supple Cardiovascular: Normal rate, regular rhythm and normal heart sounds.  No murmur heard. No BLE edema. Pulmonary/Chest: Effort normal and breath sounds normal. No respiratory distress. Abdominal: Soft.  There is no tenderness. Skin: laceration on left index finger, gets close to the nail but without nail damage, removed bandaid and no bleeding  Psychiatric: Patient has a normal mood and affect. behavior is normal. Judgment and thought content normal.  PHQ2/9: Depression screen [facility] 2/9 10/01/2019 06/26/2019 04/09/2019 04/09/2019 10/09/2018  Decreased Interest 1 0 0 0 0  Down, Depressed, Hopeless 1 0 0 0 0  PHQ - 2 Score 2 0 0 0 0  Altered sleeping 2 0 - 0 0  Tired, decreased energy 1 0 - 0 0  Change in appetite 0 0 - 0 0  Feeling bad or failure about yourself  0 0 - 0 0  Trouble concentrating 0 3 - 0 0  Moving slowly or fidgety/restless 0 0 - 0 0  Suicidal thoughts 0 0 - 0 0  PHQ-9 Score 5 3 - 0 0  Difficult doing work/chores Not difficult at all Not difficult at all - - -  Some recent data might be hidden    phq 9 is positive   Fall Risk: Fall Risk  10/01/2019 06/26/2019 04/09/2019 04/09/2019 10/09/2018  Falls in the past year? 0 0 0 0 0  Number falls in past yr: 0 - 0 0 0  Injury with Fall? 0 - 0 0 0  Risk for fall due to : - - No Fall Risks - -  Follow up - - Falls prevention discussed - -     Functional Status Survey: Is the patient deaf or have difficulty hearing?: No Does the patient have difficulty seeing, even when wearing glasses/contacts?: No Does the patient  have difficulty concentrating, remembering, or making decisions?: No Does the patient have difficulty walking or climbing stairs?: No Does the patient have difficulty dressing or bathing?: No Does the patient have difficulty doing errands alone such as visiting a doctor's office or shopping?: No    Assessment & Plan  1. Dyslipidemia  - Lipid panel  2. Essential hypertension  - COMPLETE METABOLIC PANEL WITH GFR - Microalbumin / creatinine urine ratio  3. GERD without esophagitis  Doing well at this time  4. Need for immunization against influenza  - Flu Vaccine QUAD 36+ mos IM  5. Hyperglycemia  - Hemoglobin A1c  6. Smokers' cough (HCC)  Stable, not ready to quit smoking, discussed CT scan chest, he is not interested   7. Other schizoaffective disorders (HCC)  Under the care of psychiatrist   8. Vitamin D deficiency  Recheck level   9. Anemia of chronic disease  - CBC with Differential/Platelet  10. Chronic constipation   11. Enlarged prostate  Recheck PSA  12. Microalbuminuria  - Microalbumin / creatinine urine ratio  13. Laceration of left index finger without foreign body without damage to nail, initial encounter  - Tdap vaccine greater than or equal to 7yo IM  14. Need for Tdap vaccination  - Tdap vaccine greater than or equal to 7yo IM

## 2019-10-01 ENCOUNTER — Ambulatory Visit (INDEPENDENT_AMBULATORY_CARE_PROVIDER_SITE_OTHER): Payer: Medicare Other | Admitting: Family Medicine

## 2019-10-01 ENCOUNTER — Encounter: Payer: Self-pay | Admitting: Family Medicine

## 2019-10-01 ENCOUNTER — Other Ambulatory Visit: Payer: Self-pay

## 2019-10-01 VITALS — BP 128/66 | HR 99 | Temp 98.7°F | Resp 18 | Ht 77.0 in | Wt 177.8 lb

## 2019-10-01 DIAGNOSIS — Z23 Encounter for immunization: Secondary | ICD-10-CM

## 2019-10-01 DIAGNOSIS — R809 Proteinuria, unspecified: Secondary | ICD-10-CM | POA: Diagnosis not present

## 2019-10-01 DIAGNOSIS — I10 Essential (primary) hypertension: Secondary | ICD-10-CM

## 2019-10-01 DIAGNOSIS — D638 Anemia in other chronic diseases classified elsewhere: Secondary | ICD-10-CM

## 2019-10-01 DIAGNOSIS — R739 Hyperglycemia, unspecified: Secondary | ICD-10-CM

## 2019-10-01 DIAGNOSIS — N400 Enlarged prostate without lower urinary tract symptoms: Secondary | ICD-10-CM

## 2019-10-01 DIAGNOSIS — E559 Vitamin D deficiency, unspecified: Secondary | ICD-10-CM | POA: Diagnosis not present

## 2019-10-01 DIAGNOSIS — S61211A Laceration without foreign body of left index finger without damage to nail, initial encounter: Secondary | ICD-10-CM | POA: Diagnosis not present

## 2019-10-01 DIAGNOSIS — J410 Simple chronic bronchitis: Secondary | ICD-10-CM

## 2019-10-01 DIAGNOSIS — K219 Gastro-esophageal reflux disease without esophagitis: Secondary | ICD-10-CM | POA: Diagnosis not present

## 2019-10-01 DIAGNOSIS — F258 Other schizoaffective disorders: Secondary | ICD-10-CM | POA: Diagnosis not present

## 2019-10-01 DIAGNOSIS — E785 Hyperlipidemia, unspecified: Secondary | ICD-10-CM

## 2019-10-01 DIAGNOSIS — K5909 Other constipation: Secondary | ICD-10-CM

## 2019-10-02 LAB — COMPLETE METABOLIC PANEL WITH GFR
AG Ratio: 2 (calc) (ref 1.0–2.5)
ALT: 17 U/L (ref 9–46)
AST: 20 U/L (ref 10–35)
Albumin: 4.4 g/dL (ref 3.6–5.1)
Alkaline phosphatase (APISO): 94 U/L (ref 35–144)
BUN: 17 mg/dL (ref 7–25)
CO2: 26 mmol/L (ref 20–32)
Calcium: 9.9 mg/dL (ref 8.6–10.3)
Chloride: 104 mmol/L (ref 98–110)
Creat: 0.98 mg/dL (ref 0.70–1.25)
GFR, Est African American: 97 mL/min/{1.73_m2} (ref >=60)
GFR, Est Non African American: 83 mL/min/{1.73_m2} (ref >=60)
Globulin: 2.2 g/dL (calc) (ref 1.9–3.7)
Glucose, Bld: 113 mg/dL — ABNORMAL HIGH (ref 65–99)
Potassium: 4.6 mmol/L (ref 3.5–5.3)
Sodium: 139 mmol/L (ref 135–146)
Total Bilirubin: 0.3 mg/dL (ref 0.2–1.2)
Total Protein: 6.6 g/dL (ref 6.1–8.1)

## 2019-10-02 LAB — MICROALBUMIN / CREATININE URINE RATIO
Creatinine, Urine: 212 mg/dL (ref 20–320)
Microalb Creat Ratio: 5 mcg/mg creat (ref <30)
Microalb, Ur: 1.1 mg/dL

## 2019-10-02 LAB — CBC WITH DIFFERENTIAL/PLATELET
Absolute Monocytes: 510 cells/uL (ref 200–950)
Basophils Absolute: 42 cells/uL (ref 0–200)
Basophils Relative: 0.7 %
Eosinophils Absolute: 138 cells/uL (ref 15–500)
Eosinophils Relative: 2.3 %
HCT: 38.6 % (ref 38.5–50.0)
Hemoglobin: 12.6 g/dL — ABNORMAL LOW (ref 13.2–17.1)
Lymphs Abs: 1098 cells/uL (ref 850–3900)
MCH: 30.1 pg (ref 27.0–33.0)
MCHC: 32.6 g/dL (ref 32.0–36.0)
MCV: 92.3 fL (ref 80.0–100.0)
MPV: 10.6 fL (ref 7.5–12.5)
Monocytes Relative: 8.5 %
Neutro Abs: 4212 cells/uL (ref 1500–7800)
Neutrophils Relative %: 70.2 %
Platelets: 287 10*3/uL (ref 140–400)
RBC: 4.18 10*6/uL — ABNORMAL LOW (ref 4.20–5.80)
RDW: 13.9 % (ref 11.0–15.0)
Total Lymphocyte: 18.3 %
WBC: 6 10*3/uL (ref 3.8–10.8)

## 2019-10-02 LAB — HEMOGLOBIN A1C
Hgb A1c MFr Bld: 5.6 % of total Hgb (ref <5.7)
Mean Plasma Glucose: 114 (calc)
eAG (mmol/L): 6.3 (calc)

## 2019-10-02 LAB — LIPID PANEL
Cholesterol: 180 mg/dL (ref <200)
HDL: 49 mg/dL (ref >=40)
LDL Cholesterol (Calc): 113 mg/dL (calc) — ABNORMAL HIGH
Non-HDL Cholesterol (Calc): 131 mg/dL (calc) — ABNORMAL HIGH (ref <130)
Total CHOL/HDL Ratio: 3.7 (calc) (ref <5.0)
Triglycerides: 83 mg/dL (ref <150)

## 2019-10-02 LAB — PSA: PSA: 1.5 ng/mL (ref <=4.0)

## 2019-10-02 LAB — VITAMIN D 25 HYDROXY (VIT D DEFICIENCY, FRACTURES): Vit D, 25-Hydroxy: 52 ng/mL (ref 30–100)

## 2019-10-07 ENCOUNTER — Other Ambulatory Visit: Payer: Self-pay

## 2019-10-07 ENCOUNTER — Ambulatory Visit
Admission: RE | Admit: 2019-10-07 | Discharge: 2019-10-07 | Disposition: A | Discharge status: Home / Self Care | Payer: Medicare Other | Source: Ambulatory Visit | Attending: Urology | Admitting: Urology

## 2019-10-07 DIAGNOSIS — Z87442 Personal history of urinary calculi: Secondary | ICD-10-CM

## 2019-10-07 DIAGNOSIS — N281 Cyst of kidney, acquired: Secondary | ICD-10-CM | POA: Insufficient documentation

## 2019-10-10 ENCOUNTER — Ambulatory Visit (INDEPENDENT_AMBULATORY_CARE_PROVIDER_SITE_OTHER): Payer: Medicare Other | Admitting: Urology

## 2019-10-10 ENCOUNTER — Other Ambulatory Visit: Payer: Self-pay

## 2019-10-10 ENCOUNTER — Encounter: Payer: Self-pay | Admitting: Urology

## 2019-10-10 VITALS — BP 109/63 | HR 94 | Ht 72.0 in | Wt 181.0 lb

## 2019-10-10 DIAGNOSIS — N200 Calculus of kidney: Secondary | ICD-10-CM

## 2019-10-10 NOTE — Progress Notes (Signed)
   10/10/2019 4:17 PM   Christopher Valdez. [birth year] [id number]  Reason for visit: Follow up nephrolithiasis, hydronephrosis  HPI: I saw [name] back in urology clinic today for follow-up.  To briefly summarize, he is a 59 year old male who lives in a group home for schizoaffective disorder who had had a 1.5 cm right UPJ stone with hydronephrosis for at least 10 years before being referred to urology.  He underwent uncomplicated right ureteroscopy and laser lithotripsy of a 1.5 cm UPJ stone on 05/10/2019, and subsequent stent removal 1 week later.  He reports that his right flank pain that he was having prior to surgery is completely resolved, and he denies any complaints today including hematuria, flank pain, or urinary symptoms.  Follow-up renal ultrasound showed some persistent mild right pelviectasis, and we opted to repeat renal US to confirm resolution. I reviewed the renal US dated 10/08/19 that does show resolution of prior hydronephrosis and no other stones.  We discussed general stone prevention strategies including adequate hydration with goal of producing 2.5 L of urine daily, increasing citric acid intake, increasing calcium intake during high oxalate meals, minimizing animal protein, and decreasing salt intake. Information about dietary recommendations given today.   RTC 1 year KUB   Sondra Come, MD  [facility] [address] [place], [place] [address] [phone]

## 2019-10-10 NOTE — Patient Instructions (Signed)
Dietary Guidelines to Help Prevent Kidney Stones Kidney stones are deposits of minerals and salts that form inside your kidneys. Your risk of developing kidney stones may be greater depending on your diet, your lifestyle, the medicines you take, and whether you have certain medical conditions. Most people can reduce their chances of developing kidney stones by following the instructions below. Depending on your overall health and the type of kidney stones you tend to develop, your dietitian may give you more specific instructions. What are tips for following this plan? Reading food labels  Choose foods with "no salt added" or "low-salt" labels. Limit your sodium intake to less than 1500 mg per day.  Choose foods with calcium for each meal and snack. Try to eat about 300 mg of calcium at each meal. Foods that contain 200-500 mg of calcium per serving include: ? 8 oz (237 ml) of milk, fortified nondairy milk, and fortified fruit juice. ? 8 oz (237 ml) of kefir, yogurt, and soy yogurt. ? 4 oz (118 ml) of tofu. ? 1 oz of cheese. ? 1 cup (300 g) of dried figs. ? 1 cup (91 g) of cooked broccoli. ? 1-3 oz can of sardines or mackerel.  Most people need 1000 to 1500 mg of calcium each day. Talk to your dietitian about how much calcium is recommended for you. Shopping  Buy plenty of fresh fruits and vegetables. Most people do not need to avoid fruits and vegetables, even if they contain nutrients that may contribute to kidney stones.  When shopping for convenience foods, choose: ? Whole pieces of fruit. ? Premade salads with dressing on the side. ? Low-fat fruit and yogurt smoothies.  Avoid buying frozen meals or prepared deli foods.  Look for foods with live cultures, such as yogurt and kefir. Cooking  Do not add salt to food when cooking. Place a salt shaker on the table and allow each person to add his or her own salt to taste.  Use vegetable protein, such as beans, textured vegetable  protein (TVP), or tofu instead of meat in pasta, casseroles, and soups. Meal planning   Eat less salt, if told by your dietitian. To do this: ? Avoid eating processed or premade food. ? Avoid eating fast food.  Eat less animal protein, including cheese, meat, poultry, or fish, if told by your dietitian. To do this: ? Limit the number of times you have meat, poultry, fish, or cheese each week. Eat a diet free of meat at least 2 days a week. ? Eat only one serving each day of meat, poultry, fish, or seafood. ? When you prepare animal protein, cut pieces into small portion sizes. For most meat and fish, one serving is about the size of one deck of cards.  Eat at least 5 servings of fresh fruits and vegetables each day. To do this: ? Keep fruits and vegetables on hand for snacks. ? Eat 1 piece of fruit or a handful of berries with breakfast. ? Have a salad and fruit at lunch. ? Have two kinds of vegetables at dinner.  Limit foods that are high in a substance called oxalate. These include: ? Spinach. ? Rhubarb. ? Beets. ? Potato chips and french fries. ? Nuts.  If you regularly take a diuretic medicine, make sure to eat at least 1-2 fruits or vegetables high in potassium each day. These include: ? Avocado. ? Banana. ? Orange, prune, carrot, or tomato juice. ? Baked potato. ? Cabbage. ? Beans and split   peas. General instructions   Drink enough fluid to keep your urine clear or pale yellow. This is the most important thing you can do.  Talk to your health care provider and dietitian about taking daily supplements. Depending on your health and the cause of your kidney stones, you may be advised: ? Not to take supplements with vitamin C. ? To take a calcium supplement. ? To take a daily probiotic supplement. ? To take other supplements such as magnesium, fish oil, or vitamin B6.  Take all medicines and supplements as told by your health care provider.  Limit alcohol intake to no  more than 1 drink a day for nonpregnant women and 2 drinks a day for men. One drink equals 12 oz of beer, 5 oz of wine, or 1 oz of hard liquor.  Lose weight if told by your health care provider. Work with your dietitian to find strategies and an eating plan that works best for you. What foods are not recommended? Limit your intake of the following foods, or as told by your dietitian. Talk to your dietitian about specific foods you should avoid based on the type of kidney stones and your overall health. Grains Breads. Bagels. Rolls. Baked goods. Salted crackers. Cereal. Pasta. Vegetables Spinach. Rhubarb. Beets. Canned vegetables. Pickles. Olives. Meats and other protein foods Nuts. Nut butters. Large portions of meat, poultry, or fish. Salted or cured meats. Deli meats. Hot dogs. Sausages. Dairy Cheese. Beverages Regular soft drinks. Regular vegetable juice. Seasonings and other foods Seasoning blends with salt. Salad dressings. Canned soups. Soy sauce. Ketchup. Barbecue sauce. Canned pasta sauce. Casseroles. Pizza. Lasagna. Frozen meals. Potato chips. French fries. Summary  You can reduce your risk of kidney stones by making changes to your diet.  The most important thing you can do is drink enough fluid. You should drink enough fluid to keep your urine clear or pale yellow.  Ask your health care provider or dietitian how much protein from animal sources you should eat each day, and also how much salt and calcium you should have each day. This information is not intended to replace advice given to you by your health care provider. Make sure you discuss any questions you have with your health care provider. Document Revised: 04/18/2018 Document Reviewed: 12/08/2015 Elsevier Patient Education  2020 [employer]vier Inc[employer]  

## 2019-10-16 ENCOUNTER — Other Ambulatory Visit: Payer: Self-pay | Admitting: Family Medicine

## 2019-10-16 DIAGNOSIS — I10 Essential (primary) hypertension: Secondary | ICD-10-CM

## 2019-10-16 DIAGNOSIS — E785 Hyperlipidemia, unspecified: Secondary | ICD-10-CM

## 2019-11-05 DIAGNOSIS — H524 Presbyopia: Secondary | ICD-10-CM | POA: Diagnosis not present

## 2019-11-20 ENCOUNTER — Other Ambulatory Visit: Payer: Self-pay | Admitting: Family Medicine

## 2019-11-20 ENCOUNTER — Ambulatory Visit: Payer: Self-pay | Admitting: Urology

## 2019-11-20 DIAGNOSIS — K219 Gastro-esophageal reflux disease without esophagitis: Secondary | ICD-10-CM

## 2020-01-20 ENCOUNTER — Ambulatory Visit: Payer: Self-pay | Admitting: *Deleted

## 2020-01-20 ENCOUNTER — Telehealth: Payer: Self-pay

## 2020-01-20 NOTE — Telephone Encounter (Signed)
Pt lives in a group home.   Christopher Valdez called in for him.  She wasn't with him, he is in bed asleep.  She is concerned that he has been dizzy.   He was so dizzy he wasn't able to drive to work over the weekend.  See notes below.   He is not having any other symptoms except being very tired.   "He is usually very active".   "This is not like him to be in the bed".   BP reading fine, eating and drinking as usual, not been sick recently, no c/o headaches, no n/v, runny nose or sore throat when I asked Stacha if he had complained of anything over the last few days. He was last tested for covid in Dec.   I suggested he be tested for covid.  [name] is going to check with her supervisor and see if the NP that comes to the group home for medical needs can come do a covid test and see him for the dizziness.   "I don't know if she can come today or not".   [name] is going to talk with her supervisor.   I asked her to call us back and let us know if the NP can come see him today otherwise he will need to be evaluated. She was agreeable to to this plan.   I let her know I would send a note to Dr. Carlynn Purl letting her know what is going on with him.  I sent my notes to [facility] for Dr. Carlynn Purl.     Reason for Disposition . [1] MILD dizziness (e.g., walking normally) AND [2] has NOT been evaluated by physician for this  (Exception: dizziness caused by heat exposure, sudden standing, or poor fluid intake)  Answer Assessment - Initial Assessment Questions 1. DESCRIPTION: "Describe your dizziness."     Lady from group home calling in for pt he is in a group home.   Christopher Valdez. 2. LIGHTHEADED: "Do you feel lightheaded?" (e.g., somewhat faint, woozy, weak upon standing)     He could not drive to work over the weekend due to being dizzy.   Sometimes he has to hold onto things to walk around.   This has happened before but he comes out of it.  It's usually from dehydration.   He doesn't  drink much water. 3. VERTIGO: "Do you feel like either you or the room is spinning or tilting?" (i.e. vertigo)     No 4. SEVERITY: "How bad is it?"  "Do you feel like you are going to faint?" "Can you stand and walk?"   - MILD: Feels slightly dizzy, but walking normally.   - MODERATE: Feels very unsteady when walking, but not falling; interferes with normal activities (e.g., school, work) .   - SEVERE: Unable to walk without falling, or requires assistance to walk without falling; feels like passing out now.      Moderate.   5. ONSET:  "When did the dizziness begin?"     A couple of days ago.    He is still in bed right now.  He got up and took his medications and went back to bed.   He is usually very active.   She did a test for covid and it was negative in Dec.     6. AGGRAVATING FACTORS: "Does anything make it worse?" (e.g., standing, change in head position)     No 7. HEART RATE: "Can you tell me your  heart rate?" "How many beats in 15 seconds?"  (Note: not all patients can do this)       Not asked 8. CAUSE: "What do you think is causing the dizziness?"     Maybe his BP is low.   He has had issue with BP before.   Yesterday it 135/73.  138/79 the day before yesterday..   9. RECURRENT SYMPTOM: "Have you had dizziness before?" If Yes, ask: "When was the last time?" "What happened that time?"     Yes due to dehydration 10. OTHER SYMPTOMS: "Do you have any other symptoms?" (e.g., fever, chest pain, vomiting, diarrhea, bleeding)       Temperature is 98.  Today 97.2 11. PREGNANCY: "Is there any chance you are pregnant?" "When was your last menstrual period?"       N/A  Protocols used: DIZZINESS [facility]

## 2020-01-20 NOTE — Telephone Encounter (Signed)
Copied from CRM [id number]. Topic: General - Other >> Jan 20, 2020 11:17 AM Dalphine Handing A wrote: Ms.Christopher Valdez called to inform  Dr. Carlynn Purl that patient received covid test yesterday that came back negative. Please advise

## 2020-02-11 DIAGNOSIS — R801 Persistent proteinuria, unspecified: Secondary | ICD-10-CM | POA: Diagnosis not present

## 2020-02-11 DIAGNOSIS — N200 Calculus of kidney: Secondary | ICD-10-CM | POA: Diagnosis not present

## 2020-02-17 DIAGNOSIS — N29 Other disorders of kidney and ureter in diseases classified elsewhere: Secondary | ICD-10-CM | POA: Insufficient documentation

## 2020-02-17 DIAGNOSIS — R801 Persistent proteinuria, unspecified: Secondary | ICD-10-CM | POA: Diagnosis not present

## 2020-02-17 DIAGNOSIS — N200 Calculus of kidney: Secondary | ICD-10-CM | POA: Diagnosis not present

## 2020-02-17 DIAGNOSIS — I10 Essential (primary) hypertension: Secondary | ICD-10-CM | POA: Diagnosis not present

## 2020-02-26 DIAGNOSIS — H5213 Myopia, bilateral: Secondary | ICD-10-CM | POA: Diagnosis not present

## 2020-03-30 NOTE — Progress Notes (Signed)
Name: Christopher Valdez.   MRN: [id number]    DOB: 03/10/1960   Date:03/31/2020       Progress Note  Subjective  Chief Complaint  Follow Up  HPI  He lives at [facility], he drove by himself to the office visit today,he continue to work at [employer] on Sundays.    Anemia: normal colonoscopy in 2014,HCT stable, hemoccult stools done end of 2019 were negative, last HCT stable, he has microalbuminuria, likely anemia of chronic disease , reviewed last labs and stable.   Dyslipidemia: he takes Simvastatin daily and denies side effects. No chest pain or decrease in exercise tolerance.no myalgias.  Reviewed last labs , LDL stable at 113 , denies myalgia. We will increase dose from 10 mg to 20 mg today   GERD: he has been taking Omeprazole, denies heartburn, abdominal painor regurgitationrecently, discussed long term use of PPI, he is not sure if he wants to stop medication.He states symptoms controlled Unchanged   Smoker's cough: he started smoking at age 43, he smokes about 1 pack a day now. . He states cough can be in am's or at night, no wheezing or SOB. He states cough is usually dry   Schizoaffective Disorder: he lives in a group home, has been stable on medications. Able to driveand work.He sees Therapist, sports - goes to [facility] -Dr. Rogers Blocker , also sees a therapist. He denies side effects of medication. 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   Constipation: he has a bowel movement daily and sometimes twice daily but denies loose, taking Fibercon with good control of symptoms. He denies any abdominal pain. Stable   Hyperglycemia: Hedenies polyphagia, polyuria or polydipsia. Last hgbA1C was back to normal down from 5.7 % to 5.6 %.   HTN/proteinuria :it was high inMarch 2016, he was placed onACE,and proteinuria  resolved it went down  33 back October 2018and up to 53 on  09/2017 he wasseen by Dr. Thedore Mins, had US kidney that showed right kidney hydronephrosis and also stone. He has been compliant with medication, taking lisinopril 10 mg and urine micro is now back to normal.   Nephrolithiasis: seen by Dr. Richardo Hanks and had cystoscopy, ureteroscopy,  laser and stent placement done on right side on 05/10/2019 , he went back on 05/16/2019 for removal of stent and is doing well since . Denies any pain or discomfort at this time    Patient Active Problem List   Diagnosis Date Noted  . Smokers' cough (HCC) 04/09/2019  . Hydronephrosis of right kidney 04/03/2018  . Right kidney stone 04/03/2018  . Avitaminosis D 10/29/2014  . Enlarged prostate 10/29/2014  . Other schizoaffective disorders (HCC) 10/10/2014  . Proteinuria 10/10/2014  . Hyperglycemia 10/10/2014  . Dyslipidemia 10/10/2014  . History of kidney stones 10/10/2014  . External hemorrhoid 10/10/2014  . Chronic constipation 10/10/2014  . Tobacco use 10/10/2014  . GERD without esophagitis 10/10/2014    Past Surgical History:  Procedure Laterality Date  . COLONOSCOPY  07/02/2012  . CYSTOSCOPY W/ RETROGRADES Right 05/10/2019   Procedure: CYSTOSCOPY WITH RETROGRADE PYELOGRAM;  Surgeon: Sondra Come, MD;  Location: [facility] ORS;  Service: Urology;  Laterality: Right;  . CYSTOSCOPY/URETEROSCOPY/HOLMIUM LASER/STENT PLACEMENT Right 05/10/2019   Procedure: CYSTOSCOPY/URETEROSCOPY/HOLMIUM LASER/STENT PLACEMENT;  Surgeon: Sondra Come, MD;  Location: [facility] ORS;  Service: Urology;  Laterality: Right;  Family History  Problem Relation Age of Onset  . Parkinson's disease Mother   . Deep vein thrombosis Father   . Osteoarthritis Father     Social History   Tobacco Use  . Smoking status: Current Every Day Smoker    Packs/day: 1.00    Years: 40.00    Pack years: 40.00    Types: Cigarettes  . Smokeless tobacco: Never Used  Substance Use Topics   . Alcohol use: No    Alcohol/week: 0.0 standard drinks     Current Outpatient Medications:  .  acetaminophen (TYLENOL) 500 MG tablet, Take 1,000 mg by mouth every 4 (four) hours as needed for mild pain or headache. , Disp: , Rfl:  .  Cholecalciferol (VITAMIN D3) 50 MCG (2000 UT) capsule, Take 1 capsule (2,000 Units total) by mouth daily., Disp: 30 capsule, Rfl: 12 .  cloZAPine (CLOZARIL) 100 MG tablet, Take 200 mg by mouth 2 (two) times daily. , Disp: , Rfl:  .  divalproex (DEPAKOTE ER) 500 MG 24 hr tablet, Take 500 mg by mouth at bedtime. , Disp: , Rfl:  .  FLUoxetine (PROZAC) 40 MG capsule, Take 40 mg by mouth daily., Disp: , Rfl:  .  INGREZZA 40 MG capsule, Take 40 mg by mouth daily., Disp: , Rfl:  .  lisinopril (ZESTRIL) 10 MG tablet, TAKE 1 TABLET BY MOUTH DAILY, Disp: 30 tablet, Rfl: 12 .  omeprazole (PRILOSEC) 40 MG capsule, TAKE 1 CAPSULE BY MOUTH EVERY MORNING FOR REFLUX, Disp: 30 capsule, Rfl: 5 .  polycarbophil (FIBERCON) 625 MG tablet, Take 625 mg by mouth daily., Disp: , Rfl:  .  simvastatin (ZOCOR) 10 MG tablet, TAKE 1 TABLET BY MOUTH DAILY AT 6:00 PM., Disp: 30 tablet, Rfl: 12  Allergies  Allergen Reactions  . Tall Ragweed Other (See Comments)    Pollen    I personally reviewed active problem list, medication list, allergies, family history, social history, health maintenance, notes from last encounter with the patient/caregiver today.   ROS  Constitutional: Negative for fever or weight change.  Respiratory: Negative for cough and shortness of breath.   Cardiovascular: Negative for chest pain or palpitations.  Gastrointestinal: Negative for abdominal pain, no bowel changes.  Musculoskeletal: Negative for gait problem or joint swelling.  Skin: Negative for rash.  Neurological: Negative for dizziness or headache.  No other specific complaints in a complete review of systems (except as listed in HPI above).  Objective  Vitals:   03/31/20 1358  BP: 120/72   Pulse: 92  Resp: 16  Temp: 97.6 F (36.4 C)  TempSrc: Oral  SpO2: 95%  Weight: 181 lb (82.1 kg)  Height: 6' (1.829 m)    Body mass index is 24.55 kg/m.  Physical Exam  Constitutional: Patient appears well-developed and well-nourished. Obese  No distress.  HEENT: head atraumatic, normocephalic, pupils equal and reactive to light, neck supple Cardiovascular: Normal rate, regular rhythm and normal heart sounds.  No murmur heard. No BLE edema. Pulmonary/Chest: Effort normal and breath sounds normal. No respiratory distress. Abdominal: Soft.  There is no tenderness. Psychiatric: Patient has a normal mood and affect. behavior is normal. Judgment and thought content normal.  PHQ2/9: Depression screen [facility] 2/9 03/31/2020 10/01/2019 06/26/2019 04/09/2019 04/09/2019  Decreased Interest 1 1 0 0 0  Down, Depressed, Hopeless 1 1 0 0 0  PHQ - 2 Score 2 2 0 0 0  Altered sleeping 1 2 0 - 0  Tired, decreased energy 1 1 0 - 0  Change in appetite 1 0 0 - 0  Feeling bad or failure about yourself  0 0 0 - 0  Trouble concentrating 0 0 3 - 0  Moving slowly or fidgety/restless 0 0 0 - 0  Suicidal thoughts 0 0 0 - 0  PHQ-9 Score 5 5 3  - 0  Difficult doing work/chores - Not difficult at all Not difficult at all - -  Some recent data might be hidden    phq 9 is positive   Fall Risk: Fall Risk  03/31/2020 10/01/2019 06/26/2019 04/09/2019 04/09/2019  Falls in the past year? 0 0 0 0 0  Number falls in past yr: 0 0 - 0 0  Injury with Fall? 0 0 - 0 0  Risk for fall due to : - - - No Fall Risks -  Follow up - - - Falls prevention discussed -      Functional Status Survey: Is the patient deaf or have difficulty hearing?: No Does the patient have difficulty seeing, even when wearing glasses/contacts?: No Does the patient have difficulty concentrating, remembering, or making decisions?: No Does the patient have difficulty walking or climbing stairs?: No Does the patient have difficulty dressing or  bathing?: No Does the patient have difficulty doing errands alone such as visiting a doctor's office or shopping?: No    Assessment & Plan  1. Essential hypertension  Continue medication   2. GERD without esophagitis  - omeprazole (PRILOSEC) 40 MG capsule; TAKE 1 CAPSULE BY MOUTH EVERY MORNING FOR REFLUX  Dispense: 30 capsule; Refill: 5  3. Smokers' cough (HCC)  Discussed importance of quitting again  4. Hyperglycemia   5. Vitamin D deficiency   6. Anemia of chronic disease  Stable  7. Other schizoaffective disorders (HCC)  Keep follow up with psychiatrist   8. Dyslipidemia  We will increase dose to 20 mg and recheck levels next visit  - simvastatin (ZOCOR) 20 MG tablet; Take 1 tablet (20 mg total) by mouth daily.  Dispense: 30 tablet; Refill: 5  9. Chronic constipation   10. History of kidney stones **

## 2020-03-31 ENCOUNTER — Other Ambulatory Visit: Payer: Self-pay

## 2020-03-31 ENCOUNTER — Encounter: Payer: Self-pay | Admitting: Family Medicine

## 2020-03-31 ENCOUNTER — Ambulatory Visit (INDEPENDENT_AMBULATORY_CARE_PROVIDER_SITE_OTHER): Payer: Medicare Other | Admitting: Family Medicine

## 2020-03-31 VITALS — BP 120/72 | HR 92 | Temp 97.6°F | Resp 16 | Ht 72.0 in | Wt 181.0 lb

## 2020-03-31 DIAGNOSIS — K219 Gastro-esophageal reflux disease without esophagitis: Secondary | ICD-10-CM

## 2020-03-31 DIAGNOSIS — D638 Anemia in other chronic diseases classified elsewhere: Secondary | ICD-10-CM

## 2020-03-31 DIAGNOSIS — Z87442 Personal history of urinary calculi: Secondary | ICD-10-CM | POA: Diagnosis not present

## 2020-03-31 DIAGNOSIS — K5909 Other constipation: Secondary | ICD-10-CM | POA: Diagnosis not present

## 2020-03-31 DIAGNOSIS — E559 Vitamin D deficiency, unspecified: Secondary | ICD-10-CM | POA: Diagnosis not present

## 2020-03-31 DIAGNOSIS — R739 Hyperglycemia, unspecified: Secondary | ICD-10-CM | POA: Diagnosis not present

## 2020-03-31 DIAGNOSIS — J410 Simple chronic bronchitis: Secondary | ICD-10-CM

## 2020-03-31 DIAGNOSIS — E785 Hyperlipidemia, unspecified: Secondary | ICD-10-CM

## 2020-03-31 DIAGNOSIS — I10 Essential (primary) hypertension: Secondary | ICD-10-CM

## 2020-03-31 DIAGNOSIS — F258 Other schizoaffective disorders: Secondary | ICD-10-CM

## 2020-03-31 MED ORDER — SIMVASTATIN 20 MG PO TABS: 20.0000 mg | ORAL_TABLET | Freq: Every day | ORAL | 5 refills | Status: DC

## 2020-03-31 MED ORDER — OMEPRAZOLE 40 MG PO CPDR: DELAYED_RELEASE_CAPSULE | ORAL | 5 refills | Status: DC

## 2020-04-01 DIAGNOSIS — J310 Chronic rhinitis: Secondary | ICD-10-CM | POA: Diagnosis not present

## 2020-04-01 DIAGNOSIS — R519 Headache, unspecified: Secondary | ICD-10-CM | POA: Diagnosis not present

## 2020-04-09 ENCOUNTER — Other Ambulatory Visit: Payer: Self-pay

## 2020-04-09 ENCOUNTER — Ambulatory Visit (INDEPENDENT_AMBULATORY_CARE_PROVIDER_SITE_OTHER): Payer: Medicare Other

## 2020-04-09 VITALS — BP 122/62 | HR 92 | Temp 98.0°F | Resp 15 | Ht 72.0 in | Wt 184.4 lb

## 2020-04-09 DIAGNOSIS — Z0000 Encounter for general adult medical examination without abnormal findings: Secondary | ICD-10-CM | POA: Diagnosis not present

## 2020-04-09 NOTE — Patient Instructions (Signed)
Christopher Valdez , Thank you for taking time to come for your Medicare Wellness Visit. I appreciate your ongoing commitment to your health goals. Please review the following plan we discussed and let me know if I can assist you in the future.   Screening recommendations/referrals: Colonoscopy: done 07/02/12. Repeat in 2024 Recommended yearly ophthalmology/optometry visit for glaucoma screening and checkup Recommended yearly dental visit for hygiene and checkup  Vaccinations: Influenza vaccine: done 10/01/19 Pneumococcal vaccine: done 12/06/11 Tdap vaccine: done 10/01/19 Shingles vaccine: Shingrix discussed. Please contact your pharmacy for coverage information.  Covid-19:  Done 04/04/19 & 04/25/19  Advanced directives: Advance directive discussed with you today. I have provided a copy for you to complete at home and have notarized. Once this is complete please bring a copy in to our office so we can scan it into your chart.  Conditions/risks identified: If you wish to quit smoking, help is available. For free tobacco cessation program offerings call the [facility] at (706)237-8314 or Live Well Line at 703-665-0575. You may also visit [email or web address] or email [email or web address] for more information on other programs.   Next appointment: Follow up in one year for your annual wellness visit.   Preventive Care 78 Years and Older, Male Preventive care refers to lifestyle choices and visits with your health care provider that can promote health and wellness. What does preventive care include?  A yearly physical exam. This is also called an annual well check.  Dental exams once or twice a year.  Routine eye exams. Ask your health care provider how often you should have your eyes checked.  Personal lifestyle choices, including:  Daily care of your teeth and gums.  Regular physical activity.  Eating a healthy diet.  Avoiding tobacco and drug use.  Limiting alcohol  use.  Practicing safe sex.  Taking low doses of aspirin every day.  Taking vitamin and mineral supplements as recommended by your health care provider. What happens during an annual well check? The services and screenings done by your health care provider during your annual well check will depend on your age, overall health, lifestyle risk factors, and family history of disease. Counseling  Your health care provider may ask you questions about your:  Alcohol use.  Tobacco use.  Drug use.  Emotional well-being.  Home and relationship well-being.  Sexual activity.  Eating habits.  History of falls.  Memory and ability to understand (cognition).  Work and work Astronomer. Screening  You may have the following tests or measurements:  Height, weight, and BMI.  Blood pressure.  Lipid and cholesterol levels. These may be checked every 5 years, or more frequently if you are over 7 years old.  Skin check.  Lung cancer screening. You may have this screening every year starting at age 27 if you have a 30-pack-year history of smoking and currently smoke or have quit within the past 15 years.  Fecal occult blood test (FOBT) of the stool. You may have this test every year starting at age 30.  Flexible sigmoidoscopy or colonoscopy. You may have a sigmoidoscopy every 5 years or a colonoscopy every 10 years starting at age 24.  Prostate cancer screening. Recommendations will vary depending on your family history and other risks.  Hepatitis C blood test.  Hepatitis B blood test.  Sexually transmitted disease (STD) testing.  Diabetes screening. This is done by checking your blood sugar (glucose) after you have not eaten for a while (fasting). You may have this  done every 1-3 years.  Abdominal aortic aneurysm (AAA) screening. You may need this if you are a current or former smoker.  Osteoporosis. You may be screened starting at age 57 if you are at high risk. Talk with your  health care provider about your test results, treatment options, and if necessary, the need for more tests. Vaccines  Your health care provider may recommend certain vaccines, such as:  Influenza vaccine. This is recommended every year.  Tetanus, diphtheria, and acellular pertussis (Tdap, Td) vaccine. You may need a Td booster every 10 years.  Zoster vaccine. You may need this after age 62.  Pneumococcal 13-valent conjugate (PCV13) vaccine. One dose is recommended after age 10.  Pneumococcal polysaccharide (PPSV23) vaccine. One dose is recommended after age 66. Talk to your health care provider about which screenings and vaccines you need and how often you need them. This information is not intended to replace advice given to you by your health care provider. Make sure you discuss any questions you have with your health care provider. Document Released: 01/23/2015 Document Revised: 09/16/2015 Document Reviewed: 10/28/2014 Elsevier Interactive Patient Education  2017 [employer] Prevention in the Home Falls can cause injuries. They can happen to people of all ages. There are many things you can do to make your home safe and to help prevent falls. What can I do on the outside of my home?  Regularly fix the edges of walkways and driveways and fix any cracks.  Remove anything that might make you trip as you walk through a door, such as a raised step or threshold.  Trim any bushes or trees on the path to your home.  Use bright outdoor lighting.  Clear any walking paths of anything that might make someone trip, such as rocks or tools.  Regularly check to see if handrails are loose or broken. Make sure that both sides of any steps have handrails.  Any raised decks and porches should have guardrails on the edges.  Have any leaves, snow, or ice cleared regularly.  Use sand or salt on walking paths during winter.  Clean up any spills in your garage right away. This includes oil  or grease spills. What can I do in the bathroom?  Use night lights.  Install grab bars by the toilet and in the tub and shower. Do not use towel bars as grab bars.  Use non-skid mats or decals in the tub or shower.  If you need to sit down in the shower, use a plastic, non-slip stool.  Keep the floor dry. Clean up any water that spills on the floor as soon as it happens.  Remove soap buildup in the tub or shower regularly.  Attach bath mats securely with double-sided non-slip rug tape.  Do not have throw rugs and other things on the floor that can make you trip. What can I do in the bedroom?  Use night lights.  Make sure that you have a light by your bed that is easy to reach.  Do not use any sheets or blankets that are too big for your bed. They should not hang down onto the floor.  Have a firm chair that has side arms. You can use this for support while you get dressed.  Do not have throw rugs and other things on the floor that can make you trip. What can I do in the kitchen?  Clean up any spills right away.  Avoid walking on wet floors.  Keep items that you use a lot in easy-to-reach places.  If you need to reach something above you, use a strong step stool that has a grab bar.  Keep electrical cords out of the way.  Do not use floor polish or wax that makes floors slippery. If you must use wax, use non-skid floor wax.  Do not have throw rugs and other things on the floor that can make you trip. What can I do with my stairs?  Do not leave any items on the stairs.  Make sure that there are handrails on both sides of the stairs and use them. Fix handrails that are broken or loose. Make sure that handrails are as long as the stairways.  Check any carpeting to make sure that it is firmly attached to the stairs. Fix any carpet that is loose or worn.  Avoid having throw rugs at the top or bottom of the stairs. If you do have throw rugs, attach them to the floor with  carpet tape.  Make sure that you have a light switch at the top of the stairs and the bottom of the stairs. If you do not have them, ask someone to add them for you. What else can I do to help prevent falls?  Wear shoes that:  Do not have high heels.  Have rubber bottoms.  Are comfortable and fit you well.  Are closed at the toe. Do not wear sandals.  If you use a stepladder:  Make sure that it is fully opened. Do not climb a closed stepladder.  Make sure that both sides of the stepladder are locked into place.  Ask someone to hold it for you, if possible.  Clearly mark and make sure that you can see:  Any grab bars or handrails.  First and last steps.  Where the edge of each step is.  Use tools that help you move around (mobility aids) if they are needed. These include:  Canes.  Walkers.  Scooters.  Crutches.  Turn on the lights when you go into a dark area. Replace any light bulbs as soon as they burn out.  Set up your furniture so you have a clear path. Avoid moving your furniture around.  If any of your floors are uneven, fix them.  If there are any pets around you, be aware of where they are.  Review your medicines with your doctor. Some medicines can make you feel dizzy. This can increase your chance of falling. Ask your doctor what other things that you can do to help prevent falls. This information is not intended to replace advice given to you by your health care provider. Make sure you discuss any questions you have with your health care provider. Document Released: 10/23/2008 Document Revised: 06/04/2015 Document Reviewed: 01/31/2014 Elsevier Interactive Patient Education  2017 [employer].

## 2020-04-09 NOTE — Progress Notes (Signed)
Subjective:   Christopher Caruth. is a 60 y.o. male who presents for Medicare Annual/Subsequent preventive examination.  Review of Systems     Cardiac Risk Factors include: advanced age (>2men, >74 women);male gender;dyslipidemia;hypertension;smoking/ tobacco exposure     Objective:    Today's Vitals   04/09/20 1414 04/09/20 1417  BP: 122/62   Pulse: 92   Resp: 15   Temp: 98 F (36.7 C)   TempSrc: Oral   SpO2: 97%   Weight: 184 lb 6.4 oz (83.6 kg)   Height: 6' (1.829 m)   PainSc:  2    Body mass index is 25.01 kg/m.  Advanced Directives 04/09/2020 04/09/2019 10/14/2015 05/20/2015 04/14/2015 10/10/2014  Does Patient Have a Medical Advance Directive? No No No No No No  Would patient like information on creating a medical advance directive? No - Patient declined No - Patient declined No - patient declined information No - patient declined information - No - patient declined information    Current Medications (verified) Outpatient Encounter Medications as of 04/09/2020  Medication Sig  . acetaminophen (TYLENOL) 500 MG tablet Take 1,000 mg by mouth every 4 (four) hours as needed for mild pain or headache.   . Cholecalciferol (VITAMIN D3) 50 MCG (2000 UT) capsule Take 1 capsule (2,000 Units total) by mouth daily.  . cloZAPine (CLOZARIL) 100 MG tablet Take 200 mg by mouth 2 (two) times daily.   . divalproex (DEPAKOTE ER) 500 MG 24 hr tablet Take 500 mg by mouth at bedtime.   Marland Kitchen FLUoxetine (PROZAC) 40 MG capsule Take 40 mg by mouth daily.  . INGREZZA 40 MG capsule Take 40 mg by mouth daily.  Marland Kitchen lisinopril (ZESTRIL) 10 MG tablet TAKE 1 TABLET BY MOUTH DAILY  . omeprazole (PRILOSEC) 40 MG capsule TAKE 1 CAPSULE BY MOUTH EVERY MORNING FOR REFLUX  . polycarbophil (FIBERCON) 625 MG tablet Take 625 mg by mouth daily.  . simvastatin (ZOCOR) 20 MG tablet Take 1 tablet (20 mg total) by mouth daily.   No facility-administered encounter medications on file as of 04/09/2020.    Allergies  (verified) Tall ragweed   History: Past Medical History:  Diagnosis Date  . Calculus of kidney   . Chronic constipation   . Dyslipidemia   . GERD (gastroesophageal reflux disease)   . Proteinuria   . Schizo-affective schizophrenia (HCC)   . Vitamin D deficiency    Past Surgical History:  Procedure Laterality Date  . COLONOSCOPY  07/02/2012  . CYSTOSCOPY W/ RETROGRADES Right 05/10/2019   Procedure: CYSTOSCOPY WITH RETROGRADE PYELOGRAM;  Surgeon: Sondra Come, MD;  Location: [facility] ORS;  Service: Urology;  Laterality: Right;  . CYSTOSCOPY/URETEROSCOPY/HOLMIUM LASER/STENT PLACEMENT Right 05/10/2019   Procedure: CYSTOSCOPY/URETEROSCOPY/HOLMIUM LASER/STENT PLACEMENT;  Surgeon: Sondra Come, MD;  Location: [facility] ORS;  Service: Urology;  Laterality: Right;   Family History  Problem Relation Age of Onset  . Parkinson's disease Mother   . Deep vein thrombosis Father   . Osteoarthritis Father    Social History   Socioeconomic History  . Marital status: Single    Spouse name: Not on file  . Number of children: 0  . Years of education: Not on file  . Highest education level: High school graduate  Occupational History  . Occupation: [employer]  Tobacco Use  . Smoking status: Current Every Day Smoker    Packs/day: 1.00    Years: 40.00    Pack years: 40.00    Types: Cigarettes  . Smokeless tobacco:  Never Used  Vaping Use  . Vaping Use: Never used  Substance and Sexual Activity  . Alcohol use: No    Alcohol/week: 0.0 standard drinks  . Drug use: No  . Sexual activity: Never  Other Topics Concern  . Not on file  Social History Narrative   He lives in a group home, but able to drive and works one day per week at [employer]   Social Determinants of Health   Financial Resource Strain: Low Risk   . Difficulty of Paying Living Expenses: Not very hard  Food Insecurity: No Food Insecurity  . Worried About Programme researcher, broadcasting/film/video in the Last Year: Never true  . Ran Out of Food in the Last  Year: Never true  Transportation Needs: No Transportation Needs  . Lack of Transportation (Medical): No  . Lack of Transportation (Non-Medical): No  Physical Activity: Inactive  . Days of Exercise per Week: 0 days  . Minutes of Exercise per Session: 0 min  Stress: No Stress Concern Present  . Feeling of Stress : Only a little  Social Connections: Socially Isolated  . Frequency of Communication with Friends and Family: More than three times a week  . Frequency of Social Gatherings with Friends and Family: Twice a week  . Attends Religious Services: Never  . Active Member of Clubs or Organizations: No  . Attends Banker Meetings: Never  . Marital Status: Never married    Tobacco Counseling Ready to quit: Not Answered Counseling given: Not Answered   Clinical Intake:  Pre-visit preparation completed: Yes  Pain : 0-10 Pain Score: 2  Pain Type: Chronic pain Pain Location: Knee Pain Orientation: Left Pain Descriptors / Indicators: Aching,Sore Pain Onset: More than a month ago Pain Frequency: Intermittent     BMI - recorded: 25.01 Nutritional Status: BMI 25 -29 Overweight Nutritional Risks: None Diabetes: No  How often do you need to have someone help you when you read instructions, pamphlets, or other written materials from your doctor or pharmacy?: 3 - Sometimes    Interpreter Needed?: No  Information entered by :: Reather Littler LPN   Activities of Daily Living In your present state of health, do you have any difficulty performing the following activities: 04/09/2020 03/31/2020  Hearing? N N  Comment declines hearing aids -  Vision? N N  Difficulty concentrating or making decisions? Y N  Walking or climbing stairs? N N  Dressing or bathing? N N  Doing errands, shopping? N N  Preparing Food and eating ? N -  Using the Toilet? N -  In the past six months, have you accidently leaked urine? Y -  Do you have problems with loss of bowel control? N -   Managing your Medications? Y -  Managing your Finances? N -  Housekeeping or managing your Housekeeping? N -  Some recent data might be hidden    Patient Care Team: Alba Cory, MD as PCP - General (Family Medicine) Katheren Puller, MD as Referring Physician (Psychiatry) Mosetta Pigeon, MD (Nephrology) Sondra Come, MD as Consulting Physician (Urology)  Indicate any recent Medical Services you may have received from other than [facility] providers in the past year (date may be approximate).     Assessment:   This is a routine wellness examination for Christopher Valdez.  Hearing/Vision screen  Hearing Screening   125Hz  250Hz  500Hz  1000Hz  2000Hz  3000Hz  4000Hz  6000Hz  8000Hz   Right ear:           Left ear:  Comments: Pt denies hearing difficulty  Vision Screening Comments: Annual vision screenings Dr. Alvester Morin  Dietary issues and exercise activities discussed: Current Exercise Habits: The patient does not participate in regular exercise at present, Exercise limited by: None identified  Goals    . Increase physical activity     Recommend increasing physical activity to 150 mins per week       Depression Screen PHQ 2/9 Scores 04/09/2020 03/31/2020 10/01/2019 06/26/2019 04/09/2019 04/09/2019 10/09/2018  PHQ - 2 Score 2 2 2  0 0 0 0  PHQ- 9 Score 4 5 5 3  - 0 0    Fall Risk Fall Risk  04/09/2020 03/31/2020 10/01/2019 06/26/2019 04/09/2019  Falls in the past year? 0 0 0 0 0  Number falls in past yr: 0 0 0 - 0  Injury with Fall? 0 0 0 - 0  Risk for fall due to : No Fall Risks - - - No Fall Risks  Follow up Falls prevention discussed - - - Falls prevention discussed    FALL RISK PREVENTION PERTAINING TO THE HOME:  Any stairs in or around the home? No  If so, are there any without handrails? No  Home free of loose throw rugs in walkways, pet beds, electrical cords, etc? Yes  Adequate lighting in your home to reduce risk of falls? Yes   ASSISTIVE DEVICES UTILIZED TO PREVENT  FALLS:  Life alert? No  Use of a cane, walker or w/c? No  Grab bars in the bathroom? Yes  Shower chair or bench in shower? No  Elevated toilet seat or a handicapped toilet? No   TIMED UP AND GO:  Was the test performed? Yes .  Length of time to ambulate 10 feet: 5 sec.   Gait steady and fast without use of assistive device  Cognitive Function: Cognitive status assessed by direct observation. Patient is followed by psychiatry for ongoing assessment. Pt declined 6CIT.          Immunizations Immunization History  Administered Date(s) Administered  . Influenza Split 11/21/2007, 10/01/2009  . Influenza, Seasonal, Injecte, Preservative Fre 12/06/2011  . Influenza,inj,Quad PF,6+ Mos 10/10/2014, 10/14/2015, 11/02/2016, 10/04/2017, 10/09/2018, 10/01/2019  . Influenza-Unspecified 09/13/2012  . PFIZER(Purple Top)SARS-COV-2 Vaccination 04/04/2019, 04/25/2019  . Pneumococcal Polysaccharide-23 12/06/2011  . Tdap 05/28/2009, 10/01/2019    TDAP status: Up to date  Flu Vaccine status: Up to date  Pneumococcal vaccine status: Up to date  Covid-19 vaccine status: Completed vaccines  Qualifies for Shingles Vaccine? Yes   Zostavax completed No   Shingrix Completed?: No.    Education has been provided regarding the importance of this vaccine. Patient has been advised to call insurance company to determine out of pocket expense if they have not yet received this vaccine. Advised may also receive vaccine at local pharmacy or Health Dept. Verbalized acceptance and understanding.  Screening Tests Health Maintenance  Topic Date Due  . HIV Screening  Never done  . COVID-19 Vaccine (3 - Booster for Pfizer series) 10/25/2019  . COLONOSCOPY (Pts 45-48yrs Insurance coverage will need to be confirmed)  07/03/2022  . TETANUS/TDAP  09/30/2029  . INFLUENZA VACCINE  Completed  . Hepatitis C Screening  Completed  . HPV VACCINES  Aged Out    Health Maintenance  Health Maintenance Due  Topic Date  Due  . HIV Screening  Never done  . COVID-19 Vaccine (3 - Booster for Pfizer series) 10/25/2019    Colorectal cancer screening: Type of screening: Colonoscopy. Completed 07/02/12. Repeat every 10 years  Lung Cancer Screening: (Low Dose CT Chest recommended if Age 96-80 years, 30 pack-year currently smoking OR have quit w/in 15years.) does qualify. Pt declined.   Additional Screening:  Hepatitis C Screening: does qualify; Completed 06/21/12.  Vision Screening: Recommended annual ophthalmology exams for early detection of glaucoma and other disorders of the eye. Is the patient up to date with their annual eye exam?  Yes  Who is the provider or what is the name of the office in which the patient attends annual eye exams? Dr. Alvester MorinBell  Dental Screening: Recommended annual dental exams for proper oral hygiene  Community Resource Referral / Chronic Care Management: CRR required this visit?  No   CCM required this visit?  No      Plan:     I have personally reviewed and noted the following in the patient's chart:   . Medical and social history . Use of alcohol, tobacco or illicit drugs  . Current medications and supplements . Functional ability and status . Nutritional status . Physical activity . Advanced directives . List of other physicians . Hospitalizations, surgeries, and ER visits in previous 12 months . Vitals . Screenings to include cognitive, depression, and falls . Referrals and appointments  In addition, I have reviewed and discussed with patient certain preventive protocols, quality metrics, and best practice recommendations. A written personalized care plan for preventive services as well as general preventive health recommendations were provided to patient.     Reather LittlerKasey Twisha Vanpelt, LPN   1/61/09603/31/2022   Nurse Notes: none

## 2020-04-21 DIAGNOSIS — H524 Presbyopia: Secondary | ICD-10-CM | POA: Diagnosis not present

## 2020-07-15 ENCOUNTER — Other Ambulatory Visit: Payer: Self-pay | Admitting: Family Medicine

## 2020-09-09 ENCOUNTER — Other Ambulatory Visit: Payer: Self-pay | Admitting: Family Medicine

## 2020-09-09 DIAGNOSIS — E785 Hyperlipidemia, unspecified: Secondary | ICD-10-CM

## 2020-09-14 IMAGING — US US RENAL
1 series · 14 of 25 positions shown · non-contrast
Comparison: 01/09/2018

CLINICAL DATA: Proteinuria

EXAM:
RENAL / URINARY TRACT ULTRASOUND COMPLETE

[Series 1: us renal · 0.26mm/px · 14 of 43 slices shown]
[im 1/43]
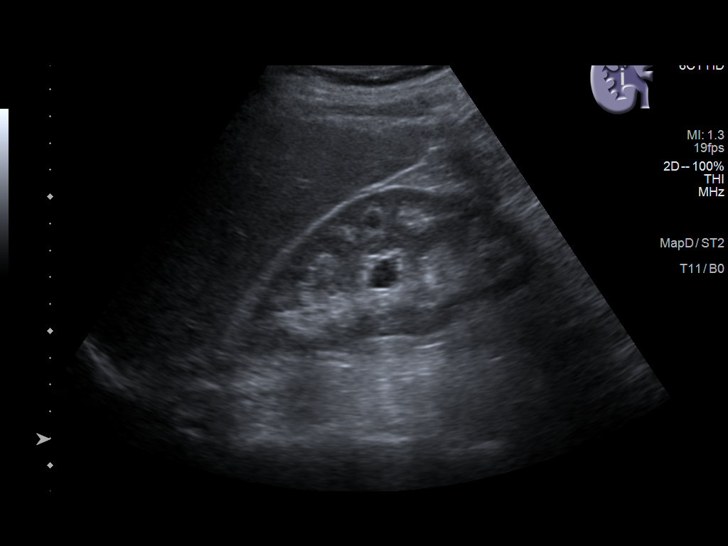
[im 4/43]
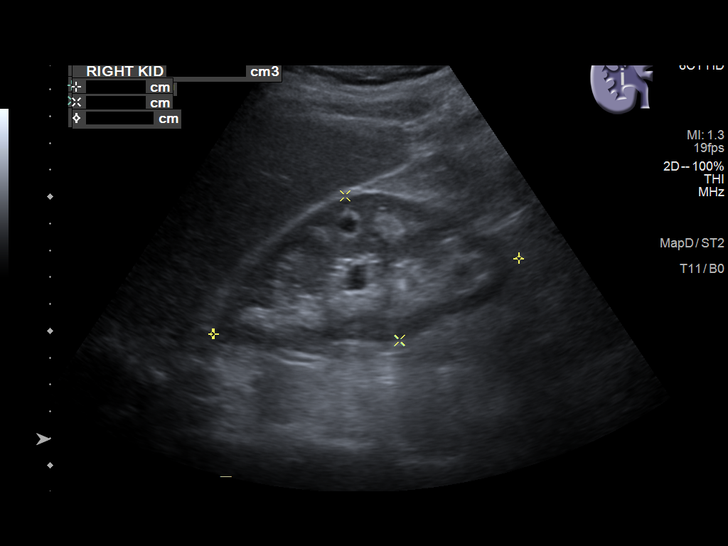
[im 8/43]
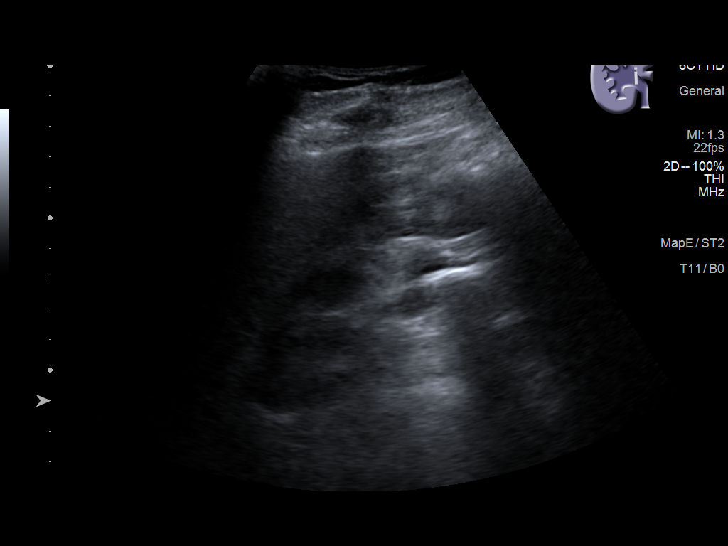
[im 11/43]
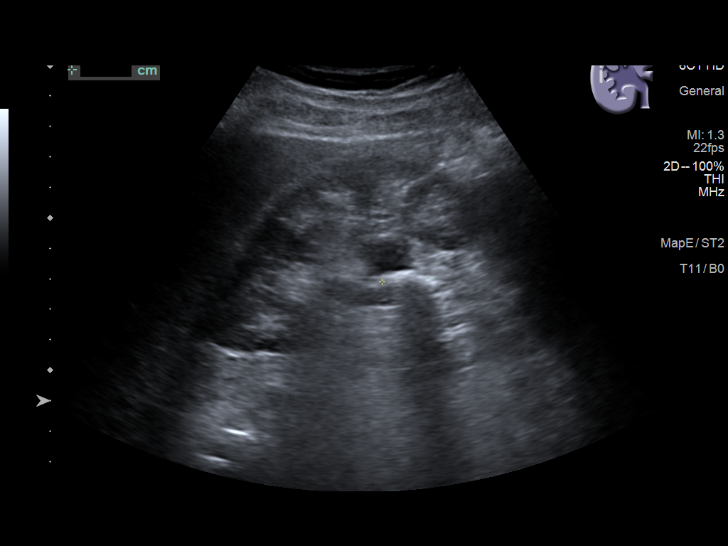
[im 15/43]
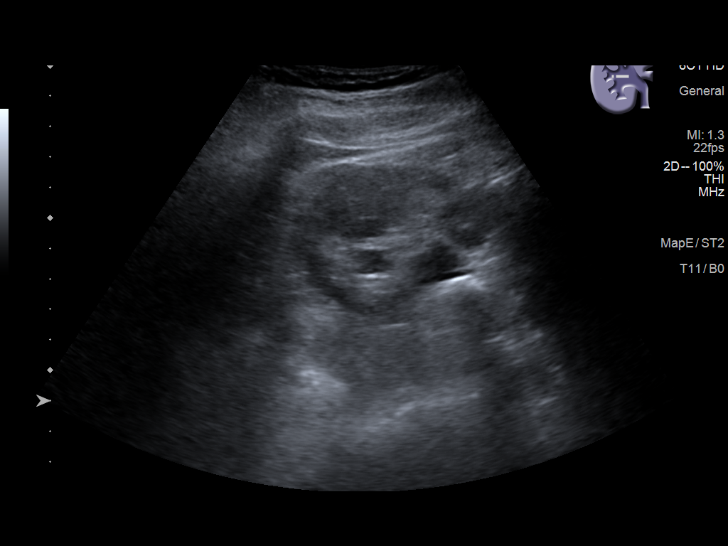
[im 16/43]
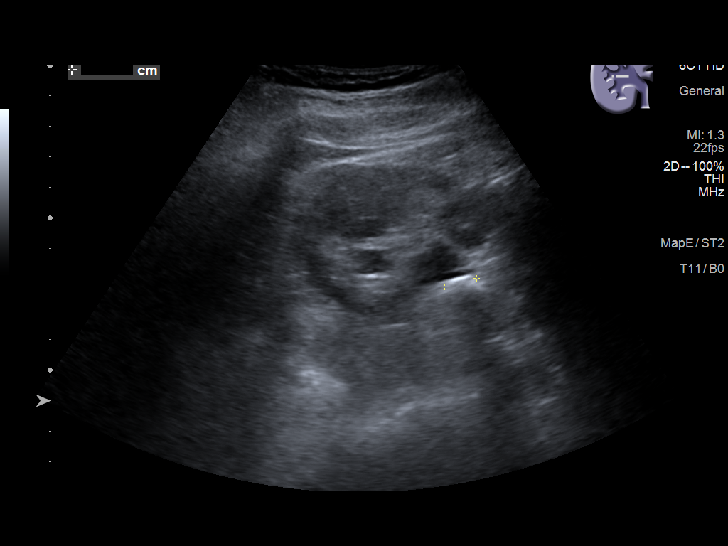
[im 20/43]
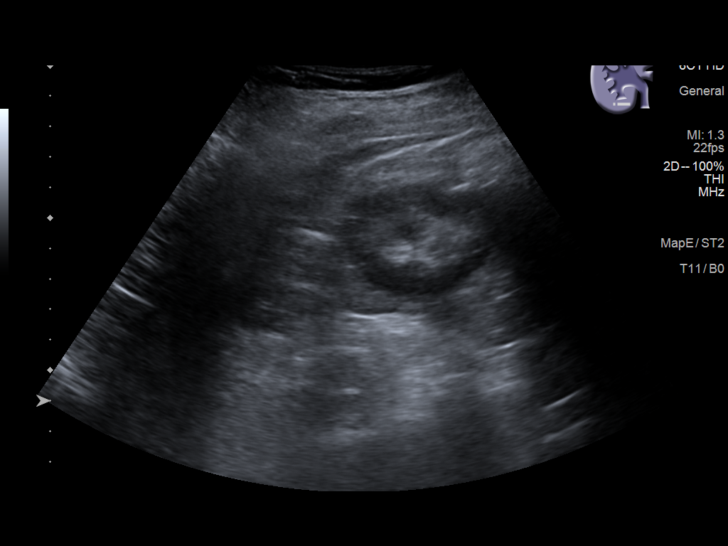
[im 23/43]
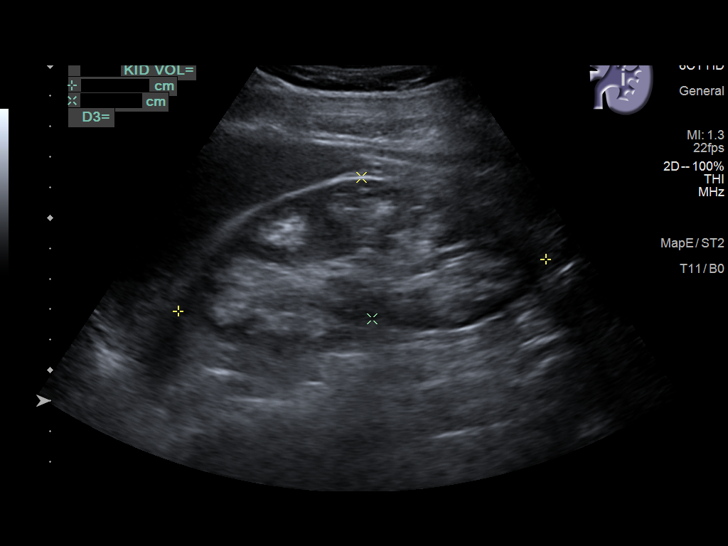
[im 27/43]
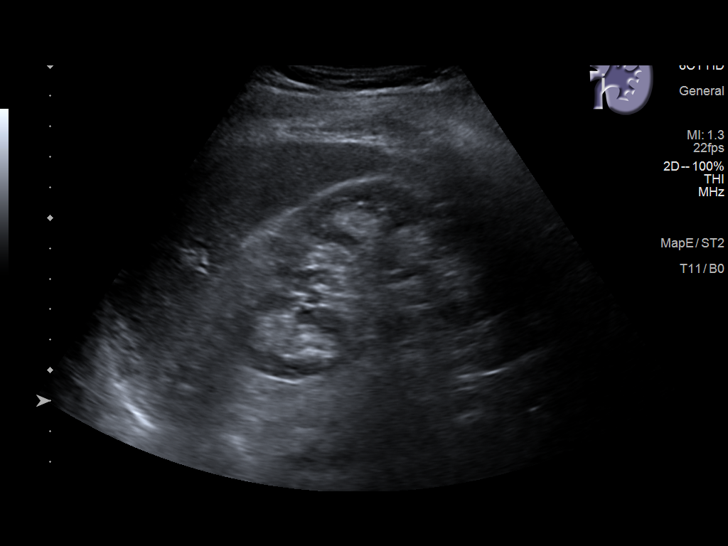
[im 29/43]
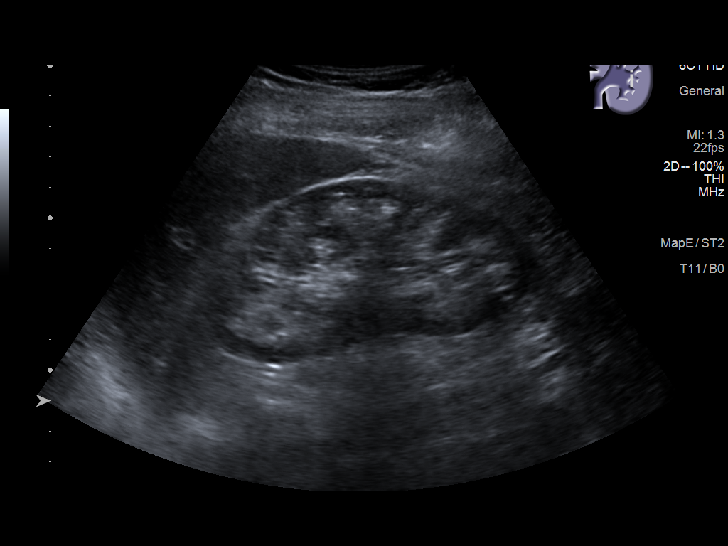
[im 32/43]
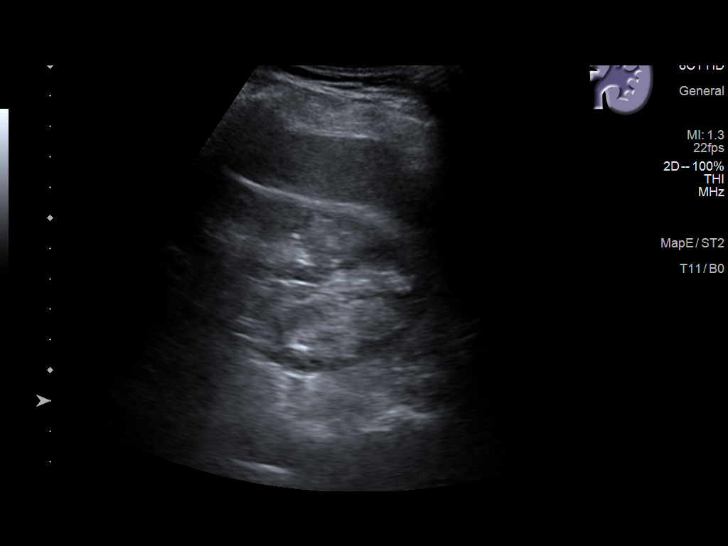
[im 36/43]
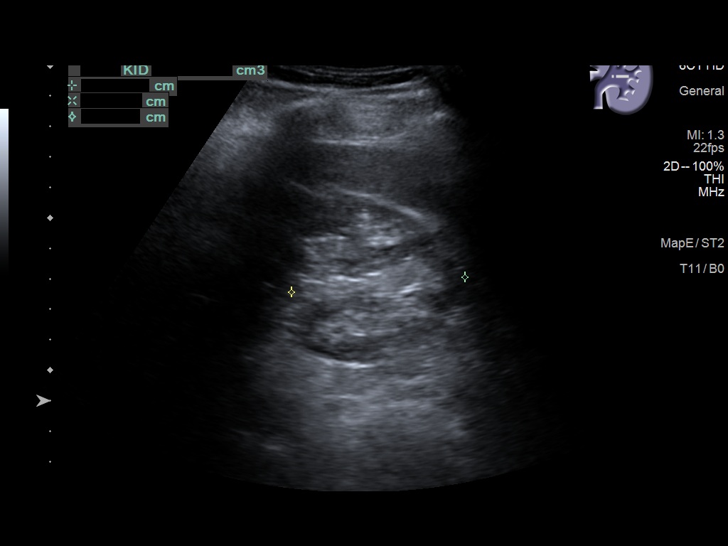
[im 39/43]
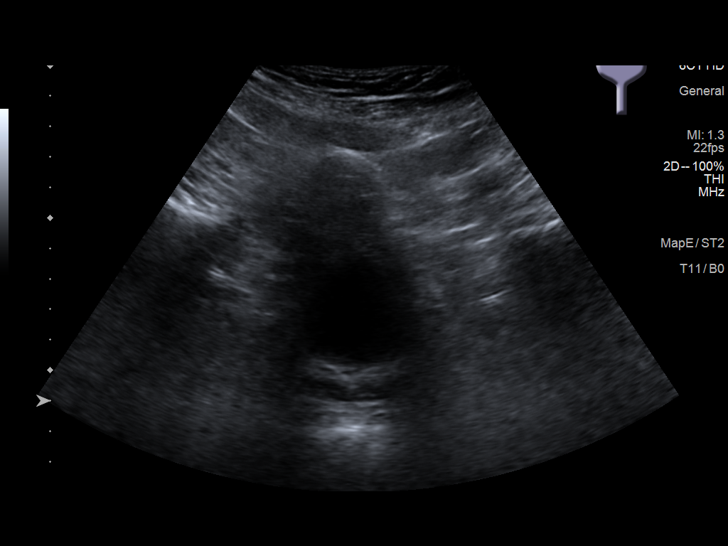
[im 43/43]
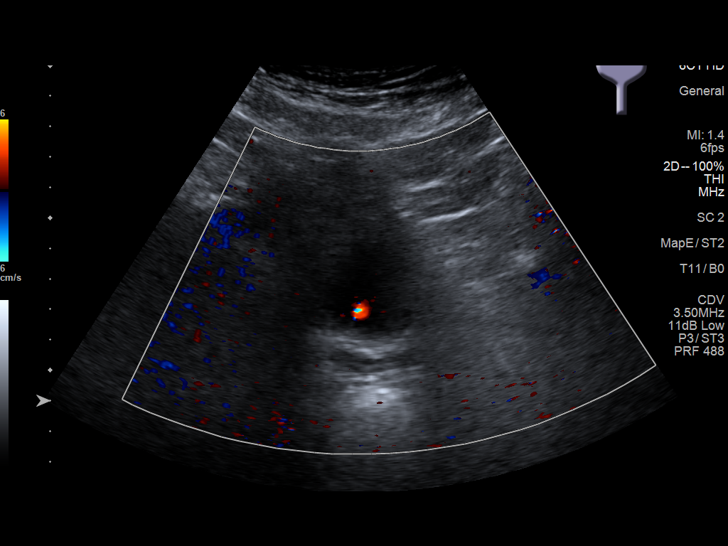

[14 of 25 positions shown; findings below may reference images not displayed]

FINDINGS: Right Kidney:

Renal measurements: 11.7 x 5.8 x 5.7 cm = volume: 201 mL. 17 mm
shadowing right renal calculus. Mild right pelviectasis. No renal
mass. Hyperechoic renal pyramids as can be seen with medullary
nephrocalcinosis.

Left Kidney:

Renal measurements: 12.2 x 4.7 x 5.7 cm = volume: 170 mL.
Echogenicity within normal limits. No mass or hydronephrosis
visualized. Hyperechoic renal pyramids as can be seen with medullary
nephrocalcinosis.

Bladder:

Appears normal for degree of bladder distention.
IMPRESSION: 1. Hyperechoic renal pyramids as can be seen with medullary
nephrocalcinosis.
2. 17 mm nonobstructing right renal calculus.

## 2020-09-29 ENCOUNTER — Ambulatory Visit (INDEPENDENT_AMBULATORY_CARE_PROVIDER_SITE_OTHER): Payer: Medicare Other | Admitting: Family Medicine

## 2020-09-29 ENCOUNTER — Encounter: Payer: Self-pay | Admitting: Family Medicine

## 2020-09-29 VITALS — Ht 72.0 in | Wt 184.0 lb

## 2020-09-29 DIAGNOSIS — I10 Essential (primary) hypertension: Secondary | ICD-10-CM | POA: Diagnosis not present

## 2020-09-29 DIAGNOSIS — F258 Other schizoaffective disorders: Secondary | ICD-10-CM

## 2020-09-29 DIAGNOSIS — E785 Hyperlipidemia, unspecified: Secondary | ICD-10-CM | POA: Diagnosis not present

## 2020-09-29 DIAGNOSIS — D638 Anemia in other chronic diseases classified elsewhere: Secondary | ICD-10-CM | POA: Diagnosis not present

## 2020-09-29 DIAGNOSIS — R739 Hyperglycemia, unspecified: Secondary | ICD-10-CM

## 2020-09-29 DIAGNOSIS — J410 Simple chronic bronchitis: Secondary | ICD-10-CM | POA: Diagnosis not present

## 2020-09-29 DIAGNOSIS — K5909 Other constipation: Secondary | ICD-10-CM | POA: Diagnosis not present

## 2020-09-29 DIAGNOSIS — K219 Gastro-esophageal reflux disease without esophagitis: Secondary | ICD-10-CM

## 2020-09-29 DIAGNOSIS — E559 Vitamin D deficiency, unspecified: Secondary | ICD-10-CM | POA: Diagnosis not present

## 2020-09-29 DIAGNOSIS — R809 Proteinuria, unspecified: Secondary | ICD-10-CM | POA: Diagnosis not present

## 2020-09-29 DIAGNOSIS — N400 Enlarged prostate without lower urinary tract symptoms: Secondary | ICD-10-CM

## 2020-09-29 MED ORDER — LISINOPRIL 10 MG PO TABS: 10.0000 mg | ORAL_TABLET | Freq: Every day | ORAL | 12 refills | Status: DC

## 2020-09-29 MED ORDER — OMEPRAZOLE 40 MG PO CPDR: DELAYED_RELEASE_CAPSULE | ORAL | 5 refills | Status: DC

## 2020-09-29 NOTE — Progress Notes (Signed)
Name: Christopher Valdez.   MRN: [id number]    DOB: Apr 04, 1960   Date:09/29/2020       Progress Note  Subjective  Chief Complaint  Follow Up  I connected with  Melchor Amour. on 09/29/20 at  3:00 PM EDT by telephone and verified that I am speaking with the correct person using two identifiers.  I discussed the limitations, risks, security and privacy concerns of performing an evaluation and management service by telephone and the availability of in person appointments. Staff also discussed with the patient that there may be a patient responsible charge related to this service. Patient agreed on having a virtual visit  Patient Location: at home  Provider Location: at home Additional Individuals present: caregiver Tobias Alexander  HPI  He lives at [facility],  he continue to work at [employer] on Sundays.     Anemia: normal colonoscopy in 2014, HCT stable, hemoccult stools done end of 2019 were negative , last HCT stable, he has microalbuminuria, likely anemia of chronic disease , we will recheck labs this week   Dyslipidemia: he takes Simvastatin daily and denies side effects. No chest pain or decrease in exercise tolerance. no myalgias.  Reviewed last labs , LDL stable at 113 , denies myalgia. He is on higher dose of Zocor, taking 20 mg now and he will return for labs this week    GERD: he has been taking Omeprazole, denies heartburn, abdominal pain or regurgitation recently , discussed long term use of PPI, he is not sure if he wants to stop medication. Stable    Smoker's cough: he started smoking at age 80, he smokes about 1 pack a day now. . He states cough can be in am's or at night, no wheezing or SOB. He states cough is usually dry and unchanged    Schizoaffective Disorder: he lives in a group home, has been stable on medications. Able to drive and work. He sees Therapist, sports - goes to [facility] - Dr. Rogers Blocker , also sees a therapist. He denies side effects of  medication. He denies problems sleeping, no hallucinations He likes his job at [employer], he works in the Dispensing optician and vegetables   Constipation: he has a bowel movement daily and sometimes twice daily but denies loose, taking Fibercon with good control of symptoms. He denies any abdominal pain. Unchanged    Hyperglycemia:  He denies polyphagia, polyuria or polydipsia. Last hgbA1C was back to normal down from 5.7 % to 5.6 %. We will recheck labs this week    HTN/proteinuria : it was high in  March 2016, he was placed on ACE,and proteinuria resolved it went down  33 back October 2018 and up to 53 on  09/2017 he was seen by Dr. Thedore Mins, had US kidney that showed right kidney hydronephrosis and also stone. He has been compliant with medication, taking lisinopril 10 mg and last urine micro was back to normal   Nephrolithiasis: seen by Dr. Richardo Hanks and had cystoscopy, ureteroscopy,  laser and stent placement done on right side on 05/10/2019 , he went back on 05/16/2019 for removal of stent and is doing well since/ No problems since   Patient Active Problem List   Diagnosis Date Noted   Essential hypertension 02/17/2020   Other disorders of kidney and ureter in diseases classified elsewhere 02/17/2020   Smokers' cough (HCC) 04/09/2019   Hydronephrosis of right kidney 04/03/2018   Right kidney stone 04/03/2018  Avitaminosis D 10/29/2014   Enlarged prostate 10/29/2014   Other schizoaffective disorders (HCC) 10/10/2014   Proteinuria 10/10/2014   Hyperglycemia 10/10/2014   Dyslipidemia 10/10/2014   History of kidney stones 10/10/2014   External hemorrhoid 10/10/2014   Chronic constipation 10/10/2014   Tobacco use 10/10/2014   GERD without esophagitis 10/10/2014    Past Surgical History:  Procedure Laterality Date   COLONOSCOPY  07/02/2012   CYSTOSCOPY W/ RETROGRADES Right 05/10/2019   Procedure: CYSTOSCOPY WITH RETROGRADE PYELOGRAM;  Surgeon: Sondra Come, MD;  Location: [facility] ORS;   Service: Urology;  Laterality: Right;   CYSTOSCOPY/URETEROSCOPY/HOLMIUM LASER/STENT PLACEMENT Right 05/10/2019   Procedure: CYSTOSCOPY/URETEROSCOPY/HOLMIUM LASER/STENT PLACEMENT;  Surgeon: Sondra Come, MD;  Location: [facility] ORS;  Service: Urology;  Laterality: Right;    Family History  Problem Relation Age of Onset   Parkinson's disease Mother    Deep vein thrombosis Father    Osteoarthritis Father     Social History   Socioeconomic History   Marital status: Single    Spouse name: Not on file   Number of children: 0   Years of education: Not on file   Highest education level: High school graduate  Occupational History   Occupation: [employer]  Tobacco Use   Smoking status: Every Day    Packs/day: 1.00    Years: 40.00    Pack years: 40.00    Types: Cigarettes   Smokeless tobacco: Never  Vaping Use   Vaping Use: Never used  Substance and Sexual Activity   Alcohol use: No    Alcohol/week: 0.0 standard drinks   Drug use: No   Sexual activity: Never  Other Topics Concern   Not on file  Social History Narrative   He lives in a group home, but able to drive and works one day per week at [employer]   Social Determinants of Corporate investment banker Strain: Low Risk    Difficulty of Paying Living Expenses: Not very hard  Food Insecurity: No Food Insecurity   Worried About Programme researcher, broadcasting/film/video in the Last Year: Never true   Barista in the Last Year: Never true  Transportation Needs: No Transportation Needs   Lack of Transportation (Medical): No   Lack of Transportation (Non-Medical): No  Physical Activity: Inactive   Days of Exercise per Week: 0 days   Minutes of Exercise per Session: 0 min  Stress: No Stress Concern Present   Feeling of Stress : Only a little  Social Connections: Socially Isolated   Frequency of Communication with Friends and Family: More than three times a week   Frequency of Social Gatherings with Friends and Family: Twice a week   Attends Religious  Services: Never   Database administrator or Organizations: No   Attends Engineer, structural: Never   Marital Status: Never married  Catering manager Violence: Not At Risk   Fear of Current or Ex-Partner: No   Emotionally Abused: No   Physically Abused: No   Sexually Abused: No     Current Outpatient Medications:    acetaminophen (TYLENOL) 500 MG tablet, Take 1,000 mg by mouth every 4 (four) hours as needed for mild pain or headache. , Disp: , Rfl:    Cholecalciferol (VITAMIN D3) 50 MCG (2000 UT) capsule, TAKE 1 CAPSULE BY MOUTH DAILY, Disp: 30 capsule, Rfl: 12   cloZAPine (CLOZARIL) 100 MG tablet, Take 200 mg by mouth 2 (two) times daily. , Disp: , Rfl:  divalproex (DEPAKOTE ER) 500 MG 24 hr tablet, Take 500 mg by mouth at bedtime. , Disp: , Rfl:    FLUoxetine (PROZAC) 40 MG capsule, Take 40 mg by mouth daily., Disp: , Rfl:    INGREZZA 40 MG capsule, Take 40 mg by mouth daily., Disp: , Rfl:    lisinopril (ZESTRIL) 10 MG tablet, TAKE 1 TABLET BY MOUTH DAILY, Disp: 30 tablet, Rfl: 12   omeprazole (PRILOSEC) 40 MG capsule, TAKE 1 CAPSULE BY MOUTH EVERY MORNING FOR REFLUX, Disp: 30 capsule, Rfl: 5   polycarbophil (FIBERCON) 625 MG tablet, Take 625 mg by mouth daily., Disp: , Rfl:    simvastatin (ZOCOR) 20 MG tablet, TAKE 1 TABLET BY MOUTH DAILY, Disp: 30 tablet, Rfl: 5  Allergies  Allergen Reactions   Tall Ragweed Other (See Comments)    Pollen    I personally reviewed active problem list, medication list, allergies, family history with the patient/caregiver today.   ROS  Ten systems reviewed and is negative except as mentioned in HPI   Objective  Virtual encounter, vitals not obtained.  There is no height or weight on file to calculate BMI.  Physical Exam  Awake, alert and cooperative, able to give the history   PHQ2/9: Depression screen [facility] 2/9 04/09/2020 03/31/2020 10/01/2019 06/26/2019 04/09/2019  Decreased Interest 1 1 1  0 0  Down, Depressed, Hopeless 1 1 1  0  0  PHQ - 2 Score 2 2 2  0 0  Altered sleeping 1 1 2  0 -  Tired, decreased energy 1 1 1  0 -  Change in appetite 0 1 0 0 -  Feeling bad or failure about yourself  0 0 0 0 -  Trouble concentrating 0 0 0 3 -  Moving slowly or fidgety/restless 0 0 0 0 -  Suicidal thoughts 0 0 0 0 -  PHQ-9 Score 4 5 5 3  -  Difficult doing work/chores Not difficult at all - Not difficult at all Not difficult at all -  Some recent data might be hidden   PHQ-2/9 Result is positive.    Fall Risk: Fall Risk  04/09/2020 03/31/2020 10/01/2019 06/26/2019 04/09/2019  Falls in the past year? 0 0 0 0 0  Number falls in past yr: 0 0 0 - 0  Injury with Fall? 0 0 0 - 0  Risk for fall due to : No Fall Risks - - - No Fall Risks  Follow up Falls prevention discussed - - - Falls prevention discussed     Assessment & Plan   1. Smokers' cough (HCC)  Stable , needs to quit smoking but not ready   2. Essential hypertension  - COMPLETE METABOLIC PANEL WITH GFR - lisinopril (ZESTRIL) 10 MG tablet; Take 1 tablet (10 mg total) by mouth daily.  Dispense: 30 tablet; Refill: 12  3. Hyperglycemia  - Hemoglobin A1c  4. Other schizoaffective disorders (HCC)   5. Anemia of chronic disease  - CBC with Differential/Platelet  6. Chronic constipation   7. Vitamin D deficiency   8. GERD without esophagitis  - omeprazole (PRILOSEC) 40 MG capsule; TAKE 1 CAPSULE BY MOUTH EVERY MORNING FOR REFLUX  Dispense: 30 capsule; Refill: 5  9. Enlarged prostate  - PSA  10. Microalbuminuria  - Microalbumin / creatinine urine ratio  11. Dyslipidemia  - Lipid panel   I discussed the assessment and treatment plan with the patient. The patient was provided an opportunity to ask questions and all were answered. The patient agreed with the plan  and demonstrated an understanding of the instructions.   The patient was advised to call back or seek an in-person evaluation if the symptoms worsen or if the condition fails to improve as  anticipated.  I provided 25  minutes of non-face-to-face time during this encounter.  Alba Cory, MD

## 2020-09-30 DIAGNOSIS — R739 Hyperglycemia, unspecified: Secondary | ICD-10-CM | POA: Diagnosis not present

## 2020-09-30 DIAGNOSIS — R809 Proteinuria, unspecified: Secondary | ICD-10-CM | POA: Diagnosis not present

## 2020-09-30 DIAGNOSIS — I10 Essential (primary) hypertension: Secondary | ICD-10-CM | POA: Diagnosis not present

## 2020-09-30 DIAGNOSIS — E785 Hyperlipidemia, unspecified: Secondary | ICD-10-CM | POA: Diagnosis not present

## 2020-10-01 LAB — HEMOGLOBIN A1C
Hgb A1c MFr Bld: 5.2 % of total Hgb (ref <5.7)
Mean Plasma Glucose: 103 mg/dL
eAG (mmol/L): 5.7 mmol/L

## 2020-10-01 LAB — CBC WITH DIFFERENTIAL/PLATELET
Absolute Monocytes: 600 cells/uL (ref 200–950)
Basophils Absolute: 48 cells/uL (ref 0–200)
Basophils Relative: 0.7 %
Eosinophils Absolute: 221 cells/uL (ref 15–500)
Eosinophils Relative: 3.2 %
HCT: 38.8 % (ref 38.5–50.0)
Hemoglobin: 12.5 g/dL — ABNORMAL LOW (ref 13.2–17.1)
Lymphs Abs: 1787 cells/uL (ref 850–3900)
MCH: 30.2 pg (ref 27.0–33.0)
MCHC: 32.2 g/dL (ref 32.0–36.0)
MCV: 93.7 fL (ref 80.0–100.0)
MPV: 10.5 fL (ref 7.5–12.5)
Monocytes Relative: 8.7 %
Neutro Abs: 4244 cells/uL (ref 1500–7800)
Neutrophils Relative %: 61.5 %
Platelets: 294 10*3/uL (ref 140–400)
RBC: 4.14 10*6/uL — ABNORMAL LOW (ref 4.20–5.80)
RDW: 13.1 % (ref 11.0–15.0)
Total Lymphocyte: 25.9 %
WBC: 6.9 10*3/uL (ref 3.8–10.8)

## 2020-10-01 LAB — COMPLETE METABOLIC PANEL WITH GFR
AG Ratio: 2.1 (calc) (ref 1.0–2.5)
ALT: 20 U/L (ref 9–46)
AST: 19 U/L (ref 10–35)
Albumin: 4.6 g/dL (ref 3.6–5.1)
Alkaline phosphatase (APISO): 90 U/L (ref 35–144)
BUN: 21 mg/dL (ref 7–25)
CO2: 27 mmol/L (ref 20–32)
Calcium: 9.7 mg/dL (ref 8.6–10.3)
Chloride: 105 mmol/L (ref 98–110)
Creat: 1.05 mg/dL (ref 0.70–1.35)
Globulin: 2.2 g/dL (calc) (ref 1.9–3.7)
Glucose, Bld: 91 mg/dL (ref 65–99)
Potassium: 4.5 mmol/L (ref 3.5–5.3)
Sodium: 139 mmol/L (ref 135–146)
Total Bilirubin: 0.4 mg/dL (ref 0.2–1.2)
Total Protein: 6.8 g/dL (ref 6.1–8.1)
eGFR: 81 mL/min/{1.73_m2} (ref >=60)

## 2020-10-01 LAB — LIPID PANEL
Cholesterol: 169 mg/dL (ref <200)
HDL: 47 mg/dL (ref >=40)
LDL Cholesterol (Calc): 99 mg/dL (calc)
Non-HDL Cholesterol (Calc): 122 mg/dL (calc) (ref <130)
Total CHOL/HDL Ratio: 3.6 (calc) (ref <5.0)
Triglycerides: 133 mg/dL (ref <150)

## 2020-10-01 LAB — MICROALBUMIN / CREATININE URINE RATIO
Creatinine, Urine: 181 mg/dL (ref 20–320)
Microalb Creat Ratio: 20 mcg/mg creat (ref <30)
Microalb, Ur: 3.6 mg/dL

## 2020-10-01 LAB — PSA: PSA: 2.94 ng/mL (ref <=4.00)

## 2020-10-06 ENCOUNTER — Ambulatory Visit: Payer: Medicare Other | Admitting: Family Medicine

## 2020-10-06 ENCOUNTER — Other Ambulatory Visit: Payer: Self-pay

## 2020-10-06 DIAGNOSIS — N200 Calculus of kidney: Secondary | ICD-10-CM

## 2020-10-08 ENCOUNTER — Ambulatory Visit
Admission: RE | Admit: 2020-10-08 | Discharge: 2020-10-08 | Disposition: A | Discharge status: Home / Self Care | Payer: Medicare Other | Source: Ambulatory Visit | Attending: Physician Assistant | Admitting: Physician Assistant

## 2020-10-08 ENCOUNTER — Encounter: Payer: Self-pay | Admitting: Physician Assistant

## 2020-10-08 ENCOUNTER — Other Ambulatory Visit: Payer: Self-pay

## 2020-10-08 ENCOUNTER — Ambulatory Visit (INDEPENDENT_AMBULATORY_CARE_PROVIDER_SITE_OTHER): Payer: Medicare Other | Admitting: Physician Assistant

## 2020-10-08 VITALS — BP 129/75 | HR 83 | Ht 73.0 in | Wt 179.0 lb

## 2020-10-08 DIAGNOSIS — N200 Calculus of kidney: Secondary | ICD-10-CM | POA: Diagnosis not present

## 2020-10-08 DIAGNOSIS — Z87442 Personal history of urinary calculi: Secondary | ICD-10-CM | POA: Diagnosis not present

## 2020-10-08 NOTE — Progress Notes (Signed)
10/08/2020 2:04 PM   Christopher Valdez. 11/26/1960 [id number]  CC: Chief Complaint  Patient presents with   Nephrolithiasis   HPI: Christopher Valdez. is a 60 y.o. male with PMH schizoaffective disorder and nephrolithiasis who underwent URS of a 1.5 cm right UPJ stone with Dr. Richardo Hanks in 2021 who presents today for 1 year follow-up with KUB.   Today he reports no flank pain, dysuria, or gross hematuria in the past year.  KUB today with no apparent urolithiasis, however interpretation is complicated by overlying bowel contents.  PMH: Past Medical History:  Diagnosis Date   Calculus of kidney    Chronic constipation    Dyslipidemia    GERD (gastroesophageal reflux disease)    Proteinuria    Schizo-affective schizophrenia (HCC)    Vitamin D deficiency     Surgical History: Past Surgical History:  Procedure Laterality Date   COLONOSCOPY  07/02/2012   CYSTOSCOPY W/ RETROGRADES Right 05/10/2019   Procedure: CYSTOSCOPY WITH RETROGRADE PYELOGRAM;  Surgeon: Sondra Come, MD;  Location: [facility] ORS;  Service: Urology;  Laterality: Right;   CYSTOSCOPY/URETEROSCOPY/HOLMIUM LASER/STENT PLACEMENT Right 05/10/2019   Procedure: CYSTOSCOPY/URETEROSCOPY/HOLMIUM LASER/STENT PLACEMENT;  Surgeon: Sondra Come, MD;  Location: [facility] OR[facility];  Service: Urology;  Laterality: Right;    Home Medications:  Allergies as of 10/08/2020       Reactions   Tall Ragweed Other (See Comments)   Pollen        Medication List        Accurate as of October 08, 2020  2:04 PM. If you have any questions, ask your nurse or doctor.          acetaminophen 500 MG tablet Commonly known as: TYLENOL Take 1,000 mg by mouth every 4 (four) hours as needed for mild pain or headache.   cloZAPine 100 MG tablet Commonly known as: CLOZARIL Take 200 mg by mouth 2 (two) times daily.   divalproex 500 MG 24 hr tablet Commonly known as: DEPAKOTE ER Take 500 mg by mouth at bedtime.   FLUoxetine 40 MG  capsule Commonly known as: PROZAC Take 40 mg by mouth daily.   Ingrezza 40 MG capsule Generic drug: valbenazine Take 40 mg by mouth daily.   lisinopril 10 MG tablet Commonly known as: ZESTRIL Take 1 tablet (10 mg total) by mouth daily.   omeprazole 40 MG capsule Commonly known as: PRILOSEC TAKE 1 CAPSULE BY MOUTH EVERY MORNING FOR REFLUX   polycarbophil 625 MG tablet Commonly known as: FIBERCON Take 625 mg by mouth daily.   simvastatin 20 MG tablet Commonly known as: ZOCOR TAKE 1 TABLET BY MOUTH DAILY   Vitamin D3 50 MCG (2000 UT) capsule TAKE 1 CAPSULE BY MOUTH DAILY        Allergies:  Allergies  Allergen Reactions   Tall Ragweed Other (See Comments)    Pollen    Family History: Family History  Problem Relation Age of Onset   Parkinson's disease Mother    Deep vein thrombosis Father    Osteoarthritis Father     Social History:   reports that he has been smoking cigarettes. He has a 40.00 pack-year smoking history. He has never used smokeless tobacco. He reports that he does not drink alcohol and does not use drugs.  Physical Exam: BP 129/75   Pulse 83   Ht 6\' 1"  (1.854 m)   Wt 179 lb (81.2 kg)   BMI 23.62 kg/m   Constitutional:  Alert and oriented, no  acute distress, nontoxic appearing HEENT: [place], AT Cardiovascular: No clubbing, cyanosis, or edema Respiratory: Normal respiratory effort, no increased work of breathing Skin: No rashes, bruises or suspicious lesions Neurologic: Grossly intact, no focal deficits, moving all 4 extremities Psychiatric: Normal mood and affect  Pertinent Imaging: KUB, 10/08/2020: CLINICAL DATA:  History of kidney stones   EXAM: ABDOMEN - 1 VIEW   COMPARISON:  05/01/2019   FINDINGS: Scattered large and small bowel gas is noted. No abnormal mass or abnormal calcifications are noted. Kidneys are somewhat obscured by overlying bowel gas. No bony abnormality is seen.   IMPRESSION: No definitive calculi identified.      Electronically Signed   By: Alcide Clever M.D.   On: 10/09/2020 23:49  I personally reviewed the images referenced above and note no apparent urolithiasis, however there is significant overlying bowel contents obscuring his renal shadows.  Assessment & Plan:   1. Nephrolithiasis Asymptomatic over the past year with no apparent interval stone growth on KUB, though admittedly scan quality is limited today.  I offered the patient annual follow-up with KUB prior versus follow-up as needed and in joint decision-making, we decided that as needed follow-up was appropriate.  We reviewed return precautions today including flank pain, dysuria, and gross hematuria.  He expressed understanding.  Return if symptoms worsen or fail to improve.  Carman Ching, PA-C  [facility] [address], [address] [place], [place] [address] [phone]

## 2020-10-22 DIAGNOSIS — H25813 Combined forms of age-related cataract, bilateral: Secondary | ICD-10-CM | POA: Diagnosis not present

## 2021-02-01 NOTE — Progress Notes (Signed)
Name: Christopher AmourJohn J Gedney Jr.   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     Anemia: normal colonoscopy in 2014, HCT stable, hemoccult stools done end of 2019 were negative , last HCT stable, he used to have microalbuminuria but that has resolved.    Dyslipidemia: he takes Simvastatin daily and denies side effects. No chest pain or decrease in exercise tolerance. no myalgias.  Reviewed last labs , LDL improved with higher dose of simvastatin .    GERD: he has been taking Omeprazole, denies heartburn, abdominal pain or regurgitation recently , discussed long term use of PPI, he is not sure if he wants to stop medication. Unchanged    Smoker's cough: he started smoking at age 61, he smokes about 1 pack a day now. He states cough can be in am's or at night, no wheezing or SOB. He states cough is dry . Reminded him to get COVID-19 booster and flu vaccine   Schizoaffective Disorder: he lives in a group home, has been stable on medications. Able to drive and work. He sees Therapist, sportssychiatrist - [facility] Dr. Rogers BlockerAhluwalia , also sees a therapist. He denies problems sleeping, no hallucinations He likes his job at [employer], he works in the Engineer, technical saleskitchen cutting fruit and vegetables. He has been stable for a long time and no side effects of medications   Constipation: he has a bowel movement daily and sometimes twice daily but denies loose, taking Fibercon with good control of symptoms. He denies any abdominal pain. Doing well at this time   Hyperglycemia:  He denies polyphagia, polyuria or polydipsia. Last hgbA1C was back to normal down from 5.7 % to 5.6 %, last level down to 5.2 %    HTN/proteinuria : it was high in  March 2016, he was placed on ACE,and proteinuria resolved it went down  33 back October 2018 and up to 53 on  09/2017 he was seen by Dr.  Thedore MinsSingh, had US kidney that showed right kidney hydronephrosis and also stone. He has been compliant with medication, taking lisinopril 10 mg and last urine micro was back to normal . He denies side effects of medication.   Nephrolithiasis: seen by Dr. Richardo HanksSninsky and had cystoscopy, ureteroscopy,  laser and stent placement done on right side on 05/10/2019 , he went back on 05/16/2019 for removal of stent and is doing well since/ No problems since He was recently seen by urologist but increase in rate of PSA was not discussed we will recheck PSA today   Patient Active Problem List   Diagnosis Date Noted   Essential hypertension 02/17/2020   Other disorders of kidney and ureter in diseases classified elsewhere 02/17/2020   Smokers' cough (HCC) 04/09/2019   Hydronephrosis of right kidney 04/03/2018   Right kidney stone 04/03/2018   Avitaminosis D 10/29/2014   Enlarged prostate 10/29/2014   Other schizoaffective disorders (HCC) 10/10/2014   Proteinuria 10/10/2014   Hyperglycemia 10/10/2014   Dyslipidemia 10/10/2014   History of kidney stones 10/10/2014   External hemorrhoid 10/10/2014   Chronic constipation 10/10/2014   Tobacco use 10/10/2014   GERD without esophagitis 10/10/2014    Past Surgical History:  Procedure Laterality Date   COLONOSCOPY  07/02/2012   CYSTOSCOPY W/ RETROGRADES Right 05/10/2019  Procedure: CYSTOSCOPY WITH RETROGRADE PYELOGRAM;  Surgeon: Billey Co, MD;  Location: [facility] OR[facility];  Service: Urology;  Laterality: Right;   CYSTOSCOPY/URETEROSCOPY/HOLMIUM LASER/STENT PLACEMENT Right 05/10/2019   Procedure: CYSTOSCOPY/URETEROSCOPY/HOLMIUM LASER/STENT PLACEMENT;  Surgeon: Billey Co, MD;  Location: [facility] OR[facility];  Service: Urology;  Laterality: Right;    Family History  Problem Relation Age of Onset   Parkinson's disease Mother    Deep vein thrombosis Father    Osteoarthritis Father     Social History   Tobacco Use   Smoking status: Every Day    Packs/day: 1.00     Years: 40.00    Pack years: 40.00    Types: Cigarettes   Smokeless tobacco: Never  Substance Use Topics   Alcohol use: No    Alcohol/week: 0.0 standard drinks     Current Outpatient Medications:    acetaminophen (TYLENOL) 500 MG tablet, Take 1,000 mg by mouth every 4 (four) hours as needed for mild pain or headache. , Disp: , Rfl:    Cholecalciferol (VITAMIN D3) 50 MCG (2000 UT) capsule, TAKE 1 CAPSULE BY MOUTH DAILY, Disp: 30 capsule, Rfl: 12   cloZAPine (CLOZARIL) 100 MG tablet, Take 200 mg by mouth 2 (two) times daily. , Disp: , Rfl:    divalproex (DEPAKOTE ER) 500 MG 24 hr tablet, Take 500 mg by mouth at bedtime. , Disp: , Rfl:    FLUoxetine (PROZAC) 40 MG capsule, Take 40 mg by mouth daily., Disp: , Rfl:    INGREZZA 80 MG capsule, Take 80 mg by mouth daily., Disp: , Rfl:    lisinopril (ZESTRIL) 10 MG tablet, Take 1 tablet (10 mg total) by mouth daily., Disp: 30 tablet, Rfl: 12   omeprazole (PRILOSEC) 40 MG capsule, TAKE 1 CAPSULE BY MOUTH EVERY MORNING FOR REFLUX, Disp: 30 capsule, Rfl: 5   polycarbophil (FIBERCON) 625 MG tablet, Take 625 mg by mouth daily., Disp: , Rfl:    simvastatin (ZOCOR) 20 MG tablet, TAKE 1 TABLET BY MOUTH DAILY, Disp: 30 tablet, Rfl: 5   Zoster Vaccine Adjuvanted (SHINGRIX) injection, Inject 0.5 mLs into the muscle once for 1 dose., Disp: 0.5 mL, Rfl: 1  Allergies  Allergen Reactions   Tall Ragweed Other (See Comments)    Pollen    I personally reviewed active problem list, medication list, allergies, family history, social history, health maintenance with the patient/caregiver today.   ROS  Constitutional: Negative for fever or weight change.  Respiratory: Positive  for cough but no shortness of breath.   Cardiovascular: Negative for chest pain or palpitations.  Gastrointestinal: Negative for abdominal pain, no bowel changes.  Musculoskeletal: Negative for gait problem or joint swelling.  Skin: Negative for rash.  Neurological: Negative for  dizziness or headache.  No other specific complaints in a complete review of systems (except as listed in HPI above).   Objective   Vitals:   02/02/21 1556  BP: 128/80  Pulse: 74  Resp: 16  Temp: 98.2 F (36.8 C)  TempSrc: Oral  SpO2: 97%  Weight: 181 lb 4.8 oz (82.2 kg)  Height: 6\' 1"  (1.854 m)    Body mass index is 23.92 kg/m.  Physical Exam  Constitutional: Patient appears well-developed and well-nourished. He looks pale/pasty but seems to be his baseline   No distress.  HEENT: head atraumatic, normocephalic, neck supple Cardiovascular: Normal rate, regular rhythm and normal heart sounds.  No murmur heard. No BLE edema. Pulmonary/Chest: Effort normal and breath sounds normal. No respiratory distress. Abdominal: Soft.  There  is no tenderness. Psychiatric: Patient has a normal mood and affect. behavior is normal. Judgment and thought content normal.    PHQ2/9: Depression screen [facility] 2/9 02/02/2021 09/29/2020 04/09/2020 03/31/2020 10/01/2019  Decreased Interest 0 0 1 1 1   Down, Depressed, Hopeless 0 0 1 1 1   PHQ - 2 Score 0 0 2 2 2   Altered sleeping 0 - 1 1 2   Tired, decreased energy 0 - 1 1 1   Change in appetite 0 - 0 1 0  Feeling bad or failure about yourself  0 - 0 0 0  Trouble concentrating 0 - 0 0 0  Moving slowly or fidgety/restless 0 - 0 0 0  Suicidal thoughts 0 - 0 0 0  PHQ-9 Score 0 - 4 5 5   Difficult doing work/chores - - Not difficult at all - Not difficult at all  Some recent data might be hidden    phq 9 is negative   Fall Risk: Fall Risk  02/02/2021 09/29/2020 04/09/2020 03/31/2020 10/01/2019  Falls in the past year? 0 0 0 0 0  Number falls in past yr: - - 0 0 0  Injury with Fall? - - 0 0 0  Risk for fall due to : - - No Fall Risks - -  Follow up Falls prevention discussed Falls prevention discussed Falls prevention discussed - -      Functional Status Survey: Is the patient deaf or have difficulty hearing?: No Does the patient have difficulty  seeing, even when wearing glasses/contacts?: No Does the patient have difficulty concentrating, remembering, or making decisions?: Yes Does the patient have difficulty walking or climbing stairs?: No Does the patient have difficulty dressing or bathing?: No Does the patient have difficulty doing errands alone such as visiting a doctor's office or shopping?: No    Assessment & Plan  1. PSA elevation  - PSA  2. Smokers' cough (HCC)  Stable   3. Essential hypertension  Bp is at goal   4. Other schizoaffective disorders ([place])   5. Anemia of chronic disease   6. Vitamin D deficiency  Continue supplementation   7. GERD without esophagitis  Controlled   8. Dyslipidemia  Doing well on Simvastatin   9. Hyperglycemia   10. Needs flu shot  - Flu Vaccine QUAD 6+ mos PF IM (Fluarix Quad PF)  11. Need for shingles vaccine  - Zoster Vaccine Adjuvanted [facility]) injection; Inject 0.5 mLs into the muscle once for 1 dose.  Dispense: 0.5 mL; Refill: 1  12. Encounter for screening for HIV  - HIV Antibody (routine testing w rflx)

## 2021-02-02 ENCOUNTER — Encounter: Payer: Self-pay | Admitting: Family Medicine

## 2021-02-02 ENCOUNTER — Ambulatory Visit (INDEPENDENT_AMBULATORY_CARE_PROVIDER_SITE_OTHER): Payer: Medicare Other | Admitting: Family Medicine

## 2021-02-02 VITALS — BP 128/80 | HR 74 | Temp 98.2°F | Resp 16 | Ht 73.0 in | Wt 181.3 lb

## 2021-02-02 DIAGNOSIS — R739 Hyperglycemia, unspecified: Secondary | ICD-10-CM

## 2021-02-02 DIAGNOSIS — Z114 Encounter for screening for human immunodeficiency virus [HIV]: Secondary | ICD-10-CM

## 2021-02-02 DIAGNOSIS — R972 Elevated prostate specific antigen [PSA]: Secondary | ICD-10-CM

## 2021-02-02 DIAGNOSIS — E785 Hyperlipidemia, unspecified: Secondary | ICD-10-CM

## 2021-02-02 DIAGNOSIS — K219 Gastro-esophageal reflux disease without esophagitis: Secondary | ICD-10-CM

## 2021-02-02 DIAGNOSIS — I10 Essential (primary) hypertension: Secondary | ICD-10-CM

## 2021-02-02 DIAGNOSIS — E559 Vitamin D deficiency, unspecified: Secondary | ICD-10-CM

## 2021-02-02 DIAGNOSIS — F258 Other schizoaffective disorders: Secondary | ICD-10-CM

## 2021-02-02 DIAGNOSIS — Z23 Encounter for immunization: Secondary | ICD-10-CM

## 2021-02-02 DIAGNOSIS — J410 Simple chronic bronchitis: Secondary | ICD-10-CM

## 2021-02-02 DIAGNOSIS — D638 Anemia in other chronic diseases classified elsewhere: Secondary | ICD-10-CM

## 2021-02-02 MED ORDER — SHINGRIX 50 MCG/0.5ML IM SUSR: 0.5000 mL | Freq: Once | INTRAMUSCULAR | 1 refills | Status: AC

## 2021-02-03 LAB — HIV ANTIBODY (ROUTINE TESTING W REFLEX): HIV 1&2 Ab, 4th Generation: NONREACTIVE

## 2021-02-03 LAB — PSA: PSA: 0.87 ng/mL (ref <=4.00)

## 2021-03-02 ENCOUNTER — Other Ambulatory Visit: Payer: Self-pay | Admitting: Family Medicine

## 2021-03-02 DIAGNOSIS — E785 Hyperlipidemia, unspecified: Secondary | ICD-10-CM

## 2021-04-13 ENCOUNTER — Ambulatory Visit (INDEPENDENT_AMBULATORY_CARE_PROVIDER_SITE_OTHER): Payer: Medicare Other

## 2021-04-13 VITALS — BP 108/62 | HR 101 | Temp 97.5°F | Resp 16 | Ht 73.0 in | Wt 171.6 lb

## 2021-04-13 DIAGNOSIS — Z122 Encounter for screening for malignant neoplasm of respiratory organs: Secondary | ICD-10-CM

## 2021-04-13 DIAGNOSIS — Z0000 Encounter for general adult medical examination without abnormal findings: Secondary | ICD-10-CM

## 2021-04-13 NOTE — Progress Notes (Signed)
? ?Subjective:  ? Christopher Valdez. is a 61 y.o. male who presents for Medicare Annual/Subsequent preventive examination. ? ?Review of Systems    ? ?Cardiac Risk Factors include: advanced age (>48men, >12 women);male gender;dyslipidemia;hypertension;smoking/ tobacco exposure ? ?   ?Objective:  ?  ?Today's Vitals  ? 04/13/21 1419  ?BP: 108/62  ?Pulse: (!) 101  ?Resp: 16  ?Temp: (!) 97.5 ?F (36.4 ?C)  ?TempSrc: Oral  ?SpO2: 95%  ?Weight: 171 lb 9.6 oz (77.8 kg)  ?Height: 6\' 1"  (1.854 m)  ? ?Body mass index is 22.64 kg/m?. ? ? ?  04/13/2021  ?  2:33 PM 04/09/2020  ?  2:31 PM 04/09/2019  ?  2:07 PM 10/14/2015  ? 10:26 AM 05/20/2015  ? 11:18 AM 04/14/2015  ?  3:14 PM 10/10/2014  ?  2:03 PM  ?Advanced Directives  ?Does Patient Have a Medical Advance Directive? No No No No No No No  ?Would patient like information on creating a medical advance directive? No - Patient declined No - Patient declined No - Patient declined No - patient declined information No - patient declined information  No - patient declined information  ? ? ?Current Medications (verified) ?Outpatient Encounter Medications as of 04/13/2021  ?Medication Sig  ? acetaminophen (TYLENOL) 500 MG tablet Take 1,000 mg by mouth every 4 (four) hours as needed for mild pain or headache.   ? Cholecalciferol (VITAMIN D3) 50 MCG (2000 UT) capsule TAKE 1 CAPSULE BY MOUTH DAILY  ? cloZAPine (CLOZARIL) 100 MG tablet Take 200 mg by mouth 2 (two) times daily.   ? divalproex (DEPAKOTE ER) 500 MG 24 hr tablet Take 500 mg by mouth at bedtime.   ? FLUoxetine (PROZAC) 40 MG capsule Take 40 mg by mouth daily.  ? INGREZZA 80 MG capsule Take 80 mg by mouth daily.  ? lisinopril (ZESTRIL) 5 MG tablet Take 5 mg by mouth daily.  ? omeprazole (PRILOSEC) 40 MG capsule TAKE 1 CAPSULE BY MOUTH EVERY MORNING FOR REFLUX  ? polycarbophil (FIBERCON) 625 MG tablet Take 625 mg by mouth daily.  ? simvastatin (ZOCOR) 20 MG tablet TAKE 1 TABLET BY MOUTH DAILY  ? [DISCONTINUED] lisinopril (ZESTRIL) 10 MG  tablet Take 1 tablet (10 mg total) by mouth daily.  ? ?No facility-administered encounter medications on file as of 04/13/2021.  ? ? ?Allergies (verified) ?Tall ragweed  ? ?History: ?Past Medical History:  ?Diagnosis Date  ? Calculus of kidney   ? Chronic constipation   ? Dyslipidemia   ? GERD (gastroesophageal reflux disease)   ? Proteinuria   ? Schizo-affective schizophrenia ([place])   ? Vitamin D deficiency   ? ?Past Surgical History:  ?Procedure Laterality Date  ? COLONOSCOPY  07/02/2012  ? CYSTOSCOPY W/ RETROGRADES Right 05/10/2019  ? Procedure: CYSTOSCOPY WITH RETROGRADE PYELOGRAM;  Surgeon: Billey Co, MD;  Location: [facility] OR[facility];  Service: Urology;  Laterality: Right;  ? CYSTOSCOPY/URETEROSCOPY/HOLMIUM LASER/STENT PLACEMENT Right 05/10/2019  ? Procedure: CYSTOSCOPY/URETEROSCOPY/HOLMIUM LASER/STENT PLACEMENT;  Surgeon: Billey Co, MD;  Location: [facility];  Service: Urology;  Laterality: Right;  ? ?Family History  ?Problem Relation Age of Onset  ? Parkinson's disease Mother   ? Deep vein thrombosis Father   ? Osteoarthritis Father   ? ?Social History  ? ?Socioeconomic History  ? Marital status: Single  ?  Spouse name: Not on file  ? Number of children: 0  ? Years of education: Not on file  ? Highest education level: High school graduate  ?Occupational History  ?  Occupation: [employer]  ?Tobacco Use  ? Smoking status: Every Day  ?  Packs/day: 1.00  ?  Years: 40.00  ?  Pack years: 40.00  ?  Types: Cigarettes  ? Smokeless tobacco: Never  ?Vaping Use  ? Vaping Use: Never used  ?Substance and Sexual Activity  ? Alcohol use: No  ?  Alcohol/week: 0.0 standard drinks  ? Drug use: No  ? Sexual activity: Never  ?Other Topics Concern  ? Not on file  ?Social History Narrative  ? He lives in a group home, but able to drive and works one day per week at [employer]  ? ?Social Determinants of Health  ? ?Financial Resource Strain: Low Risk   ? Difficulty of Paying Living Expenses: Not very hard  ?Food Insecurity: No Food Insecurity  ?  Worried About Charity fundraiser in the Last Year: Never true  ? Ran Out of Food in the Last Year: Never true  ?Transportation Needs: No Transportation Needs  ? Lack of Transportation (Medical): No  ? Lack of Transportation (Non-Medical): No  ?Physical Activity: Inactive  ? Days of Exercise per Week: 0 days  ? Minutes of Exercise per Session: 0 min  ?Stress: No Stress Concern Present  ? Feeling of Stress : Not at all  ?Social Connections: Socially Isolated  ? Frequency of Communication with Friends and Family: More than three times a week  ? Frequency of Social Gatherings with Friends and Family: Twice a week  ? Attends Religious Services: Never  ? Active Member of Clubs or Organizations: No  ? Attends Archivist Meetings: Never  ? Marital Status: Never married  ? ? ?Tobacco Counseling ?Ready to quit: No ?Counseling given: Not Answered ? ? ?Clinical Intake: ? ?Pre-visit preparation completed: Yes ? ?Pain : No/denies pain ? ?  ? ?Nutritional Risks: None ?Diabetes: No ? ?How often do you need to have someone help you when you read instructions, pamphlets, or other written materials from your doctor or pharmacy?: 1 - Never ? ? ? ?Interpreter Needed?: No ? ?Information entered by :: Clemetine Marker LPN ? ? ?Activities of Daily Living ? ?  04/13/2021  ?  2:33 PM 02/02/2021  ? 12:51 PM  ?In your present state of health, do you have any difficulty performing the following activities:  ?Hearing? 0 0  ?Vision? 0 0  ?Difficulty concentrating or making decisions? 1 1  ?Walking or climbing stairs? 0 0  ?Dressing or bathing? 0 0  ?Doing errands, shopping? 0 0  ?Preparing Food and eating ? N   ?Using the Toilet? N   ?In the past six months, have you accidently leaked urine? N   ?Do you have problems with loss of bowel control? N   ?Managing your Medications? Y   ?Managing your Finances? Y   ?Housekeeping or managing your Housekeeping? N   ? ? ?Patient Care Team: ?Steele Sizer, MD as PCP - General (Family  Medicine) ?Randel Pigg, MD as Referring Physician (Psychiatry) ?Murlean Iba, MD (Nephrology) ?Billey Co, MD as Consulting Physician (Urology) ? ?Indicate any recent Medical Services you may have received from other than [facility] providers in the past year (date may be approximate). ? ?   ?Assessment:  ? This is a routine wellness examination for Holger. ? ?Hearing/Vision screen ?Hearing Screening - Comments:: Pt denies hearing difficulty ?Vision Screening - Comments:: Annual vision screenings Dr. Gloriann Loan ? ?Dietary issues and exercise activities discussed: ?Current Exercise Habits: The patient does not  participate in regular exercise at present, Exercise limited by: None identified ? ? Goals Addressed   ?None ?  ? ?Depression Screen ? ?  04/13/2021  ?  2:31 PM 02/02/2021  ? 12:57 PM 09/29/2020  ?  9:57 AM 04/09/2020  ?  2:29 PM 03/31/2020  ?  1:58 PM 10/01/2019  ?  1:11 PM 06/26/2019  ?  1:38 PM  ?PHQ 2/9 Scores  ?PHQ - 2 Score 2 0 0 2 2 2  0  ?PHQ- 9 Score 4 0  4 5 5 3   ?  ?Fall Risk ? ?  04/13/2021  ?  2:33 PM 02/02/2021  ? 12:51 PM 09/29/2020  ?  9:57 AM 04/09/2020  ?  2:31 PM 03/31/2020  ?  1:58 PM  ?Fall Risk   ?Falls in the past year? 0 0 0 0 0  ?Number falls in past yr: 0   0 0  ?Injury with Fall? 0   0 0  ?Risk for fall due to : No Fall Risks   No Fall Risks   ?Follow up Falls prevention discussed Falls prevention discussed Falls prevention discussed Falls prevention discussed   ? ? ?FALL RISK PREVENTION PERTAINING TO THE HOME: ? ?Any stairs in or around the home? No  ?If so, are there any without handrails? No  ?Home free of loose throw rugs in walkways, pet beds, electrical cords, etc? Yes  ?Adequate lighting in your home to reduce risk of falls? Yes  ? ?ASSISTIVE DEVICES UTILIZED TO PREVENT FALLS: ? ?Life alert? No  ?Use of a cane, walker or w/c? No  ?Grab bars in the bathroom? Yes  ?Shower chair or bench in shower? No ?Elevated toilet seat or a handicapped toilet? No ? ?TIMED UP AND GO: ? ?Was the test  performed? Yes .  ?Length of time to ambulate 10 feet: 5 sec.  ? ?Gait steady and fast without use of assistive device ? ?Cognitive Function: Cognitive status assessed by direct observation. Patient is followed by psychiatry for o

## 2021-04-13 NOTE — Patient Instructions (Signed)
Christopher Valdez , ?Thank you for taking time to come for your Medicare Wellness Visit. I appreciate your ongoing commitment to your health goals. Please review the following plan we discussed and let me know if I can assist you in the future.  ? ?Screening recommendations/referrals: ?Colonoscopy: done 07/02/12. Repeat 06/2022 ?Recommended yearly ophthalmology/optometry visit for glaucoma screening and checkup ?Recommended yearly dental visit for hygiene and checkup ? ?Vaccinations: ?Influenza vaccine: done 02/02/21 ?Pneumococcal vaccine: done 12/06/11 ?Tdap vaccine: done 10/01/19 ?Shingles vaccine: Shingrix discussed. Please contact your pharmacy for coverage information.  ?Covid-19: done 04/04/19 & 04/25/19 ? ?Advanced directives: Advance directive discussed with you today. Even though you declined this today please call our office should you change your mind and we can give you the proper paperwork for you to fill out.  ? ?Conditions/risks identified: If you wish to quit smoking, help is available. For free tobacco cessation program offerings call the [facility] at (818)627-3854 or Live Well Line at [phone]. You may also visit [email or web address] or email [email or web address] for more information on other programs.  ? ?Next appointment: Follow up in one year for your annual wellness visit  ? ?Preventive Care 40-64 Years, Male ?Preventive care refers to lifestyle choices and visits with your health care provider that can promote health and wellness. ?What does preventive care include? ?A yearly physical exam. This is also called an annual well check. ?Dental exams once or twice a year. ?Routine eye exams. Ask your health care provider how often you should have your eyes checked. ?Personal lifestyle choices, including: ?Daily care of your teeth and gums. ?Regular physical activity. ?Eating a healthy diet. ?Avoiding tobacco and drug use. ?Limiting alcohol use. ?Practicing safe sex. ?Taking low-dose  aspirin every day starting at age 49. ?What happens during an annual well check? ?The services and screenings done by your health care provider during your annual well check will depend on your age, overall health, lifestyle risk factors, and family history of disease. ?Counseling  ?Your health care provider may ask you questions about your: ?Alcohol use. ?Tobacco use. ?Drug use. ?Emotional well-being. ?Home and relationship well-being. ?Sexual activity. ?Eating habits. ?Work and work Statistician. ?Screening  ?You may have the following tests or measurements: ?Height, weight, and BMI. ?Blood pressure. ?Lipid and cholesterol levels. These may be checked every 5 years, or more frequently if you are over 70 years old. ?Skin check. ?Lung cancer screening. You may have this screening every year starting at age 23 if you have a 30-pack-year history of smoking and currently smoke or have quit within the past 15 years. ?Fecal occult blood test (FOBT) of the stool. You may have this test every year starting at age 60. ?Flexible sigmoidoscopy or colonoscopy. You may have a sigmoidoscopy every 5 years or a colonoscopy every 10 years starting at age 80. ?Prostate cancer screening. Recommendations will vary depending on your family history and other risks. ?Hepatitis C blood test. ?Hepatitis B blood test. ?Sexually transmitted disease (STD) testing. ?Diabetes screening. This is done by checking your blood sugar (glucose) after you have not eaten for a while (fasting). You may have this done every 1-3 years. ?Discuss your test results, treatment options, and if necessary, the need for more tests with your health care provider. ?Vaccines  ?Your health care provider may recommend certain vaccines, such as: ?Influenza vaccine. This is recommended every year. ?Tetanus, diphtheria, and acellular pertussis (Tdap, Td) vaccine. You may need a Td booster every 10 years. ?Zoster vaccine. You  may need this after age 27. ?Pneumococcal  13-valent conjugate (PCV13) vaccine. You may need this if you have certain conditions and have not been vaccinated. ?Pneumococcal polysaccharide (PPSV23) vaccine. You may need one or two doses if you smoke cigarettes or if you have certain conditions. ?Talk to your health care provider about which screenings and vaccines you need and how often you need them. ?This information is not intended to replace advice given to you by your health care provider. Make sure you discuss any questions you have with your health care provider. ?Document Released: 01/23/2015 Document Revised: 09/16/2015 Document Reviewed: 10/28/2014 ?Elsevier Interactive Patient Education ? 2017 [place]. ? ?Fall Prevention in the Home ?Falls can cause injuries. They can happen to people of all ages. There are many things you can do to make your home safe and to help prevent falls. ?What can I do on the outside of my home? ?Regularly fix the edges of walkways and driveways and fix any cracks. ?Remove anything that might make you trip as you walk through a door, such as a raised step or threshold. ?Trim any bushes or trees on the path to your home. ?Use bright outdoor lighting. ?Clear any walking paths of anything that might make someone trip, such as rocks or tools. ?Regularly check to see if handrails are loose or broken. Make sure that both sides of any steps have handrails. ?Any raised decks and porches should have guardrails on the edges. ?Have any leaves, snow, or ice cleared regularly. ?Use sand or salt on walking paths during winter. ?Clean up any spills in your garage right away. This includes oil or grease spills. ?What can I do in the bathroom? ?Use night lights. ?Install grab bars by the toilet and in the tub and shower. Do not use towel bars as grab bars. ?Use non-skid mats or decals in the tub or shower. ?If you need to sit down in the shower, use a plastic, non-slip stool. ?Keep the floor dry. Clean up any water that spills on the  floor as soon as it happens. ?Remove soap buildup in the tub or shower regularly. ?Attach bath mats securely with double-sided non-slip rug tape. ?Do not have throw rugs and other things on the floor that can make you trip. ?What can I do in the bedroom? ?Use night lights. ?Make sure that you have a light by your bed that is easy to reach. ?Do not use any sheets or blankets that are too big for your bed. They should not hang down onto the floor. ?Have a firm chair that has side arms. You can use this for support while you get dressed. ?Do not have throw rugs and other things on the floor that can make you trip. ?What can I do in the kitchen? ?Clean up any spills right away. ?Avoid walking on wet floors. ?Keep items that you use a lot in easy-to-reach places. ?If you need to reach something above you, use a strong step stool that has a grab bar. ?Keep electrical cords out of the way. ?Do not use floor polish or wax that makes floors slippery. If you must use wax, use non-skid floor wax. ?Do not have throw rugs and other things on the floor that can make you trip. ?What can I do with my stairs? ?Do not leave any items on the stairs. ?Make sure that there are handrails on both sides of the stairs and use them. Fix handrails that are broken or loose. Make sure that  handrails are as long as the stairways. ?Check any carpeting to make sure that it is firmly attached to the stairs. Fix any carpet that is loose or worn. ?Avoid having throw rugs at the top or bottom of the stairs. If you do have throw rugs, attach them to the floor with carpet tape. ?Make sure that you have a light switch at the top of the stairs and the bottom of the stairs. If you do not have them, ask someone to add them for you. ?What else can I do to help prevent falls? ?Wear shoes that: ?Do not have high heels. ?Have rubber bottoms. ?Are comfortable and fit you well. ?Are closed at the toe. Do not wear sandals. ?If you use a stepladder: ?Make sure that  it is fully opened. Do not climb a closed stepladder. ?Make sure that both sides of the stepladder are locked into place. ?Ask someone to hold it for you, if possible. ?Clearly mark and make sure that you

## 2021-04-14 ENCOUNTER — Other Ambulatory Visit: Payer: Self-pay | Admitting: Family Medicine

## 2021-04-20 ENCOUNTER — Ambulatory Visit: Payer: Medicare Other

## 2021-04-30 ENCOUNTER — Encounter: Payer: Self-pay | Admitting: Internal Medicine

## 2021-04-30 ENCOUNTER — Emergency Department: Payer: Medicare Other

## 2021-04-30 ENCOUNTER — Inpatient Hospital Stay
Admission: EM | Admit: 2021-04-30 | Discharge: 2021-05-02 | DRG: 871 | Disposition: A | Discharge status: Home / Self Care | Payer: Medicare Other | Attending: Internal Medicine | Admitting: Internal Medicine

## 2021-04-30 DIAGNOSIS — Z820 Family history of epilepsy and other diseases of the nervous system: Secondary | ICD-10-CM

## 2021-04-30 DIAGNOSIS — E785 Hyperlipidemia, unspecified: Secondary | ICD-10-CM | POA: Diagnosis not present

## 2021-04-30 DIAGNOSIS — Z79899 Other long term (current) drug therapy: Secondary | ICD-10-CM

## 2021-04-30 DIAGNOSIS — F17210 Nicotine dependence, cigarettes, uncomplicated: Secondary | ICD-10-CM | POA: Diagnosis present

## 2021-04-30 DIAGNOSIS — L89301 Pressure ulcer of unspecified buttock, stage 1: Secondary | ICD-10-CM | POA: Diagnosis present

## 2021-04-30 DIAGNOSIS — A419 Sepsis, unspecified organism: Secondary | ICD-10-CM | POA: Diagnosis not present

## 2021-04-30 DIAGNOSIS — Z8701 Personal history of pneumonia (recurrent): Secondary | ICD-10-CM | POA: Diagnosis present

## 2021-04-30 DIAGNOSIS — Z720 Tobacco use: Secondary | ICD-10-CM | POA: Diagnosis present

## 2021-04-30 DIAGNOSIS — L8990 Pressure ulcer of unspecified site, unspecified stage: Secondary | ICD-10-CM | POA: Insufficient documentation

## 2021-04-30 DIAGNOSIS — R6520 Severe sepsis without septic shock: Secondary | ICD-10-CM | POA: Diagnosis present

## 2021-04-30 DIAGNOSIS — J189 Pneumonia, unspecified organism: Secondary | ICD-10-CM | POA: Diagnosis present

## 2021-04-30 DIAGNOSIS — J690 Pneumonitis due to inhalation of food and vomit: Secondary | ICD-10-CM | POA: Diagnosis present

## 2021-04-30 DIAGNOSIS — I10 Essential (primary) hypertension: Secondary | ICD-10-CM | POA: Diagnosis not present

## 2021-04-30 DIAGNOSIS — K219 Gastro-esophageal reflux disease without esophagitis: Secondary | ICD-10-CM | POA: Diagnosis present

## 2021-04-30 DIAGNOSIS — J9601 Acute respiratory failure with hypoxia: Secondary | ICD-10-CM | POA: Diagnosis present

## 2021-04-30 DIAGNOSIS — Y92009 Unspecified place in unspecified non-institutional (private) residence as the place of occurrence of the external cause: Secondary | ICD-10-CM

## 2021-04-30 DIAGNOSIS — T189XXA Foreign body of alimentary tract, part unspecified, initial encounter: Secondary | ICD-10-CM | POA: Diagnosis present

## 2021-04-30 DIAGNOSIS — F258 Other schizoaffective disorders: Secondary | ICD-10-CM | POA: Diagnosis present

## 2021-04-30 DIAGNOSIS — K5909 Other constipation: Secondary | ICD-10-CM | POA: Diagnosis not present

## 2021-04-30 DIAGNOSIS — Z20822 Contact with and (suspected) exposure to covid-19: Secondary | ICD-10-CM | POA: Diagnosis present

## 2021-04-30 DIAGNOSIS — F419 Anxiety disorder, unspecified: Secondary | ICD-10-CM | POA: Diagnosis present

## 2021-04-30 DIAGNOSIS — F32A Depression, unspecified: Secondary | ICD-10-CM | POA: Diagnosis present

## 2021-04-30 DIAGNOSIS — Z87442 Personal history of urinary calculi: Secondary | ICD-10-CM

## 2021-04-30 LAB — CBC WITH DIFFERENTIAL/PLATELET
Abs Immature Granulocytes: 0.24 10*3/uL — ABNORMAL HIGH (ref 0.00–0.07)
Basophils Absolute: 0.1 10*3/uL (ref 0.0–0.1)
Basophils Relative: 1 %
Eosinophils Absolute: 0.2 10*3/uL (ref 0.0–0.5)
Eosinophils Relative: 2 %
HCT: 40 % (ref 39.0–52.0)
Hemoglobin: 12.6 g/dL — ABNORMAL LOW (ref 13.0–17.0)
Immature Granulocytes: 2 %
Lymphocytes Relative: 25 %
Lymphs Abs: 3 10*3/uL (ref 0.7–4.0)
MCH: 29.8 pg (ref 26.0–34.0)
MCHC: 31.5 g/dL (ref 30.0–36.0)
MCV: 94.6 fL (ref 80.0–100.0)
Monocytes Absolute: 1 10*3/uL (ref 0.1–1.0)
Monocytes Relative: 9 %
Neutro Abs: 7.4 10*3/uL (ref 1.7–7.7)
Neutrophils Relative %: 61 %
Platelets: 248 10*3/uL (ref 150–400)
RBC: 4.23 MIL/uL (ref 4.22–5.81)
RDW: 14 % (ref 11.5–15.5)
WBC: 12 10*3/uL — ABNORMAL HIGH (ref 4.0–10.5)
nRBC: 0 % (ref 0.0–0.2)

## 2021-04-30 LAB — LACTIC ACID, PLASMA
Lactic Acid, Venous: 2.6 mmol/L (ref 0.5–1.9)
Lactic Acid, Venous: 1.9 mmol/L (ref 0.5–1.9)

## 2021-04-30 LAB — COMPREHENSIVE METABOLIC PANEL
ALT: 28 U/L (ref 0–44)
AST: 35 U/L (ref 15–41)
Albumin: 3.9 g/dL (ref 3.5–5.0)
Alkaline Phosphatase: 88 U/L (ref 38–126)
Anion gap: 10 (ref 5–15)
BUN: 20 mg/dL (ref 8–23)
CO2: 25 mmol/L (ref 22–32)
Calcium: 9 mg/dL (ref 8.9–10.3)
Chloride: 104 mmol/L (ref 98–111)
Creatinine, Ser: 1.13 mg/dL (ref 0.61–1.24)
GFR, Estimated: >60 mL/min (ref >60)
Glucose, Bld: 139 mg/dL — ABNORMAL HIGH (ref 70–99)
Potassium: 3.6 mmol/L (ref 3.5–5.1)
Sodium: 139 mmol/L (ref 135–145)
Total Bilirubin: 0.5 mg/dL (ref 0.3–1.2)
Total Protein: 6.8 g/dL (ref 6.5–8.1)

## 2021-04-30 LAB — RESP PANEL BY RT-PCR (FLU A&B, COVID) ARPGX2
Influenza A by PCR: NEGATIVE
Influenza B by PCR: NEGATIVE
SARS Coronavirus 2 by RT PCR: NEGATIVE

## 2021-04-30 LAB — PROCALCITONIN: Procalcitonin: <0.1 ng/mL

## 2021-04-30 MED ORDER — SODIUM CHLORIDE 0.9 % IV BOLUS
1000.0000 mL | Freq: Once | INTRAVENOUS | Status: AC
  Administered 2021-04-30: 1000 mL via INTRAVENOUS

## 2021-04-30 MED ORDER — CLOZAPINE 100 MG PO TABS
200.0000 mg | ORAL_TABLET | Freq: Two times a day (BID) | ORAL | Status: DC
  Administered 2021-05-01 – 2021-05-02 (×2): 200 mg via ORAL
  Filled 2021-04-30 (×3): qty 2

## 2021-04-30 MED ORDER — SODIUM CHLORIDE 0.9 % IV SOLN
500.0000 mg | INTRAVENOUS | Status: DC
  Administered 2021-04-30: 500 mg via INTRAVENOUS
  Filled 2021-04-30: qty 5

## 2021-04-30 MED ORDER — DIVALPROEX SODIUM ER 500 MG PO TB24
500.0000 mg | ORAL_TABLET | Freq: Every day | ORAL | Status: DC
  Administered 2021-05-01: 500 mg via ORAL
  Filled 2021-04-30: qty 1
  Filled 2021-04-30: qty 2

## 2021-04-30 MED ORDER — SODIUM CHLORIDE 0.9 % IV SOLN: 2.0000 g | INTRAVENOUS | Status: DC

## 2021-04-30 MED ORDER — POLYETHYLENE GLYCOL 3350 17 G PO PACK: 17.0000 g | PACK | Freq: Every day | ORAL | Status: DC | PRN

## 2021-04-30 MED ORDER — METRONIDAZOLE 500 MG/100ML IV SOLN
500.0000 mg | Freq: Once | INTRAVENOUS | Status: AC
  Administered 2021-04-30: 500 mg via INTRAVENOUS
  Filled 2021-04-30: qty 100

## 2021-04-30 MED ORDER — ENOXAPARIN SODIUM 40 MG/0.4ML IJ SOSY
40.0000 mg | PREFILLED_SYRINGE | INTRAMUSCULAR | Status: DC
  Administered 2021-05-01: 40 mg via SUBCUTANEOUS
  Filled 2021-04-30: qty 0.4

## 2021-04-30 MED ORDER — NICOTINE 21 MG/24HR TD PT24
21.0000 mg | MEDICATED_PATCH | Freq: Every day | TRANSDERMAL | Status: DC | PRN
Start: 2021-04-30 — End: 2021-05-02

## 2021-04-30 MED ORDER — SODIUM CHLORIDE 0.9 % IV SOLN: 500.0000 mg | INTRAVENOUS | Status: DC

## 2021-04-30 MED ORDER — SODIUM CHLORIDE 0.9 % IV BOLUS (SEPSIS)
1000.0000 mL | Freq: Once | INTRAVENOUS | Status: AC
  Administered 2021-04-30: 1000 mL via INTRAVENOUS

## 2021-04-30 MED ORDER — ACETAMINOPHEN 325 MG PO TABS
650.0000 mg | ORAL_TABLET | Freq: Four times a day (QID) | ORAL | Status: DC | PRN
Start: 2021-04-30 — End: 2021-05-02
  Administered 2021-05-01: 650 mg via ORAL
  Filled 2021-04-30: qty 2

## 2021-04-30 MED ORDER — PANTOPRAZOLE SODIUM 40 MG PO TBEC
80.0000 mg | DELAYED_RELEASE_TABLET | Freq: Every day | ORAL | Status: DC
Start: 2021-05-01 — End: 2021-05-02
  Administered 2021-05-02: 80 mg via ORAL
  Filled 2021-04-30: qty 2

## 2021-04-30 MED ORDER — ONDANSETRON HCL 4 MG PO TABS: 4.0000 mg | ORAL_TABLET | Freq: Four times a day (QID) | ORAL | Status: DC | PRN

## 2021-04-30 MED ORDER — SODIUM CHLORIDE 0.9 % IV SOLN
2.0000 g | INTRAVENOUS | Status: DC
  Administered 2021-04-30: 2 g via INTRAVENOUS
  Filled 2021-04-30: qty 20

## 2021-04-30 MED ORDER — CALCIUM POLYCARBOPHIL 625 MG PO TABS
625.0000 mg | ORAL_TABLET | Freq: Every day | ORAL | Status: DC
Start: 2021-05-01 — End: 2021-05-02
  Administered 2021-05-02: 625 mg via ORAL
  Filled 2021-04-30 (×2): qty 1

## 2021-04-30 MED ORDER — FLUOXETINE HCL 20 MG PO CAPS
40.0000 mg | ORAL_CAPSULE | Freq: Every day | ORAL | Status: DC
  Administered 2021-05-02: 40 mg via ORAL
  Filled 2021-04-30: qty 2

## 2021-04-30 MED ORDER — SIMVASTATIN 20 MG PO TABS
20.0000 mg | ORAL_TABLET | Freq: Every day | ORAL | Status: DC
  Administered 2021-05-01: 20 mg via ORAL
  Filled 2021-04-30: qty 1

## 2021-04-30 MED ORDER — ONDANSETRON HCL 4 MG/2ML IJ SOLN: 4.0000 mg | Freq: Four times a day (QID) | INTRAMUSCULAR | Status: DC | PRN

## 2021-04-30 MED ORDER — VALBENAZINE TOSYLATE 40 MG PO CAPS
80.0000 mg | ORAL_CAPSULE | Freq: Every day | ORAL | Status: DC
  Administered 2021-05-02: 80 mg via ORAL
  Filled 2021-04-30 (×2): qty 2

## 2021-04-30 MED ORDER — VITAMIN D3 25 MCG (1000 UNIT) PO TABS
2000.0000 [IU] | ORAL_TABLET | Freq: Every day | ORAL | Status: DC
  Administered 2021-05-02: 2000 [IU] via ORAL
  Filled 2021-04-30 (×3): qty 2

## 2021-04-30 MED ORDER — ACETAMINOPHEN 650 MG RE SUPP: 650.0000 mg | Freq: Four times a day (QID) | RECTAL | Status: DC | PRN

## 2021-04-30 NOTE — Progress Notes (Signed)
Patient off nonrebreather mask to heated high flow per md. Patient placed on 30/30. O2 saturation 98%. Tolerated interventions well. ?

## 2021-04-30 NOTE — Assessment & Plan Note (Signed)
-  Initially met severe sepsis with increased respiration rate, heart rate, leukocytosis, source of pneumonia and elevated lactic acid to 2.6 ?- Repeat lactic acid improved to 1.9 ?- Check procalcitonin, blood cultures ?- Status post azithromycin, ceftriaxone IV to complete 5 days, and metronidazole one-time dose per EDP ?- Continue azithromycin and ceftriaxone ?- Admit to progressive cardiac, observation ?

## 2021-04-30 NOTE — Assessment & Plan Note (Signed)
-   Resumed home FiberCon 625 mg p.o. daily ?- MiraLAX as needed for constipation ?

## 2021-04-30 NOTE — H&P (Addendum)
History and Physical   Christopher Valdez. ZOX:[id number] DOB: Jun 08, 1960 DOA: 04/30/2021  PCP: Alba Cory, MD  Outpatient Specialists: Dr. Mosetta Pigeon, nephrology Patient coming from: Group home via EMS  I have personally briefly reviewed patient's old medical records in [facility] EMR.  Chief Concern: Choked on a steak  HPI: [name] is a 61 year old male with history of hyperlipidemia, depression, anxiety, schizoaffective disorder, GERD, who presents from group home for chief concerns of choking on a piece of steak.  In the emergency department, emergency medicine provider was able to retrieve foreign body successfully.  Vitals in the emergency department showed temperature of 98, respiration rate 24, heart rate 116, blood pressure 176/85, SPO2 of 95% on nonrebreather and was increased to high flow nasal cannula.  Labs in the emergency department showed serum sodium 139, potassium 3.6, chloride 104, bicarb 25, nonfasting blood glucose 139, BUN of 20, serum creatinine of 1.13, EGFR greater than 60, WBC elevated at 12, hemoglobin 12.6, platelets of 248.  Lactic acid was elevated at 2.6.  COVID/influenza A/influenza B PCR were negative.  Blood cultures x2 have been ordered.  Portable chest x-ray was read as bilateral airspace disease, left greater than the right concerning for pneumonia.  ED treatment: Ceftriaxone 2 g, azithromycin 500 mg, Flagyl 500 mg IV one-time dose, sodium chloride 1 L bolus.  At bedside, he is able to tell me his name, age, current location, and current calendar year.   He reports that he choked on a piece of steak. He is having left lower chest pain every time he coughs. He states it hurts the chest when I press in the area, the pain is a 6/10. He denies feeling shortness of breath, nausea, vomiting, dysuria, diarrhea.   Social history: He is a group home. He currently smokes about 1/2 ppd. He denies etoh, recreational drug use. He is currently  working at [employer] and [employer] one day per week.   Vaccination history: He is vaccinated for covid and influenza   ROS: Constitutional: no weight change, no fever ENT/Mouth: no sore throat, no rhinorrhea Eyes: no eye pain, no vision changes Cardiovascular: + chest pain, no dyspnea,  no edema, no palpitations Respiratory: + cough, no sputum, no wheezing Gastrointestinal: no nausea, no vomiting, no diarrhea, no constipation Genitourinary: no urinary incontinence, no dysuria, no hematuria Musculoskeletal: no arthralgias, no myalgias Skin: no skin lesions, no pruritus, Neuro: + weakness, no loss of consciousness, no syncope Psych: no anxiety, no depression, + decrease appetite Heme/Lymph: no bruising, no bleeding  ED Course: Discussed with emergency medicine provider, patient requiring hospitalization for chief concerns of meeting sepsis criteria.  Assessment/Plan  Principal Problem:   Sepsis (HCC) Active Problems:   Other schizoaffective disorders (HCC)   Dyslipidemia   Chronic constipation   Tobacco use   GERD without esophagitis   Essential hypertension   Assessment and Plan:  * Sepsis (HCC) - Initially met severe sepsis with increased respiration rate, heart rate, leukocytosis, source of pneumonia and elevated lactic acid to 2.6 - Repeat lactic acid improved to 1.9 - Check procalcitonin, blood cultures - Status post azithromycin, ceftriaxone IV to complete 5 days, and metronidazole one-time dose per EDP - Continue azithromycin and ceftriaxone - Admit to progressive cardiac, observation  GERD without esophagitis - Resumed home PPI  Tobacco use - Nicotine patch as needed for nicotine craving ordered  Chronic constipation - Resumed home FiberCon 625 mg p.o. daily - MiraLAX as needed for constipation  Dyslipidemia -  Simvastatin 20 mg daily resumed  Other schizoaffective disorders (HCC) - Resumed home Depakote 500 mg nightly, fluoxetine 40 mg daily, clozapine 200 mg p.o. twice  daily - Resumed home valbenazine 80 mg daily  Chart reviewed.   DVT prophylaxis: Enoxaparin 40 mg subcutaneous Code Status: full code Diet: N.p.o. except for sips with meds and ice chips Family Communication: offered to call, patient declines Disposition Plan: Pending clinical course Consults called: None at this time Admission status: Progressive, observation  Past Medical History:  Diagnosis Date   Calculus of kidney    Chronic constipation    Dyslipidemia    GERD (gastroesophageal reflux disease)    Proteinuria    Schizo-affective schizophrenia (HCC)    Vitamin D deficiency    Past Surgical History:  Procedure Laterality Date   COLONOSCOPY  07/02/2012   CYSTOSCOPY W/ RETROGRADES Right 05/10/2019   Procedure: CYSTOSCOPY WITH RETROGRADE PYELOGRAM;  Surgeon: Sondra Come, MD;  Location: [facility] ORS;  Service: Urology;  Laterality: Right;   CYSTOSCOPY/URETEROSCOPY/HOLMIUM LASER/STENT PLACEMENT Right 05/10/2019   Procedure: CYSTOSCOPY/URETEROSCOPY/HOLMIUM LASER/STENT PLACEMENT;  Surgeon: Sondra Come, MD;  Location: [facility] ORS;  Service: Urology;  Laterality: Right;   Social History:  reports that he has been smoking cigarettes. He has a 40.00 pack-year smoking history. He has never used smokeless tobacco. He reports that he does not drink alcohol and does not use drugs.  Allergies  Allergen Reactions   Tall Ragweed Other (See Comments)    Pollen   Family History  Problem Relation Age of Onset   Parkinson's disease Mother    Deep vein thrombosis Father    Osteoarthritis Father    Family history: Family history reviewed and not pertinent.  Prior to Admission medications   Medication Sig Start Date End Date Taking? Authorizing Provider  acetaminophen (TYLENOL) 500 MG tablet Take 1,000 mg by mouth every 4 (four) hours as needed for mild pain or headache.    Yes [provider]  Cholecalciferol (VITAMIN D3) 50 MCG (2000 UT) capsule TAKE 1 CAPSULE BY MOUTH DAILY  07/15/20  Yes Sowles, Danna Hefty, MD  cloZAPine (CLOZARIL) 100 MG tablet Take 200 mg by mouth 2 (two) times daily.    Yes Ahluwalia, Sharlene Dory, MD  divalproex (DEPAKOTE ER) 500 MG 24 hr tablet Take 500 mg by mouth at bedtime.  04/16/19  Yes [provider]  FLUoxetine (PROZAC) 40 MG capsule Take 40 mg by mouth daily.   Yes [provider]  INGREZZA 80 MG capsule Take 80 mg by mouth daily. 01/26/21  Yes [provider]  omeprazole (PRILOSEC) 40 MG capsule TAKE 1 CAPSULE BY MOUTH EVERY MORNING FOR REFLUX 09/29/20  Yes Sowles, Danna Hefty, MD  polycarbophil (FIBERCON) 625 MG tablet Take 625 mg by mouth daily.   Yes [provider]  simvastatin (ZOCOR) 20 MG tablet TAKE 1 TABLET BY MOUTH DAILY 03/02/21  Yes Alba Cory, MD   Physical Exam: Vitals:   04/30/21 2130 04/30/21 2200 04/30/21 2230 04/30/21 2300  BP: 138/67 126/64 128/77 (!) 146/76  Pulse: 95 95 91 91  Resp: (!) 25 (!) 26 (!) 23 (!) 27  Temp:      TempSrc:      SpO2: 100% 99% 98% 97%  Weight:      Height:       Constitutional: appears age appropriate, NAD, calm, comfortable Eyes: PERRL, lids and conjunctivae normal ENMT: Mucous membranes are moist. Posterior pharynx clear of any exudate or lesions. Age-appropriate dentition. Hearing appropriate Neck: normal, supple,  no masses, no thyromegaly Respiratory: clear to auscultation bilaterally, no wheezing, no crackles. Normal respiratory effort. No accessory muscle use.  Cardiovascular: Regular rate and rhythm, no murmurs / rubs / gallops. No extremity edema. 2+ pedal pulses. No carotid bruits.  Abdomen: no tenderness, no masses palpated, no hepatosplenomegaly. Bowel sounds positive.  Musculoskeletal: no clubbing / cyanosis. No joint deformity upper and lower extremities. Good ROM, no contractures, no atrophy. Normal muscle tone.  Skin: no rashes, lesions, ulcers. No induration Neurologic: Sensation intact. Strength 5/5 in all 4.  Psychiatric: Normal judgment  and insight. Alert and oriented x 3. Normal mood.   EKG: independently reviewed, showing sinus tachycardia with rate of 114, QTc 430  Chest x-ray on Admission: I personally reviewed and I agree with radiologist reading as below.  DG Chest Portable 1 View  Result Date: 04/30/2021 CLINICAL DATA:  Food aspiration EXAM: PORTABLE CHEST 1 VIEW COMPARISON:  None. FINDINGS: Airspace disease throughout the left mid and lower lung and in the right infrahilar region. No effusions. No pneumothorax. Heart is normal size. No acute bony abnormality. IMPRESSION: Bilateral airspace disease, left greater than right concerning for pneumonia. Electronically Signed   By: Charlett Nose M.D.   On: 04/30/2021 19:55    Labs on Admission: I have personally reviewed following labs  CBC: Recent Labs  Lab 04/30/21 1917  WBC 12.0*  NEUTROABS 7.4  HGB 12.6*  HCT 40.0  MCV 94.6  PLT 248   Basic Metabolic Panel: Recent Labs  Lab 04/30/21 1917  NA 139  K 3.6  CL 104  CO2 25  GLUCOSE 139*  BUN 20  CREATININE 1.13  CALCIUM 9.0   GFR: Estimated Creatinine Clearance: 74.4 mL/min (by C-G formula based on SCr of 1.13 mg/dL).  Liver Function Tests: Recent Labs  Lab 04/30/21 1917  AST 35  ALT 28  ALKPHOS 88  BILITOT 0.5  PROT 6.8  ALBUMIN 3.9   Urine analysis:    Component Value Date/Time   COLORURINE YELLOW 05/07/2019 1209   APPEARANCEUR Cloudy (A) 05/16/2019 1413   LABSPEC 1.020 05/07/2019 1209   PHURINE 7.0 05/07/2019 1209   GLUCOSEU Negative 05/16/2019 1413   HGBUR TRACE (A) 05/07/2019 1209   BILIRUBINUR Negative 05/16/2019 1413   KETONESUR NEGATIVE 05/07/2019 1209   PROTEINUR 3+ (A) 05/16/2019 1413   PROTEINUR NEGATIVE 05/07/2019 1209   NITRITE Negative 05/16/2019 1413   NITRITE NEGATIVE 05/07/2019 1209   LEUKOCYTESUR 2+ (A) 05/16/2019 1413   LEUKOCYTESUR NEGATIVE 05/07/2019 1209   CRITICAL CARE Performed by: Nadyne Coombes Isidra Mings  Total critical care time: 35 minutes  Critical care time was  exclusive of separately billable procedures and treating other patients.  Critical care was necessary to treat or prevent imminent or life-threatening deterioration.  Critical care was time spent personally by me on the following activities: development of treatment plan with patient and/or surrogate as well as nursing, discussions with consultants, evaluation of patient's response to treatment, examination of patient, obtaining history from patient or surrogate, ordering and performing treatments and interventions, ordering and review of laboratory studies, ordering and review of radiographic studies, pulse oximetry and re-evaluation of patient's condition.  Dr. Sedalia Muta [facility]  If 7PM-7AM, please contact overnight-coverage provider If 7AM-7PM, please contact day coverage provider [email or web address]  04/30/2021, 11:14 PM

## 2021-04-30 NOTE — Assessment & Plan Note (Signed)
-   Simvastatin 20 mg daily resumed 

## 2021-04-30 NOTE — Assessment & Plan Note (Signed)
-   Nicotine patch as needed for nicotine craving ordered ?

## 2021-04-30 NOTE — Assessment & Plan Note (Signed)
-   Resumed home PPI °

## 2021-04-30 NOTE — Assessment & Plan Note (Addendum)
-   Resumed home Depakote 500 mg nightly, fluoxetine 40 mg daily, clozapine 200 mg p.o. twice daily ?- Resumed home valbenazine 80 mg daily ?

## 2021-04-30 NOTE — Hospital Course (Signed)
[name] is a 61 year old male with history of hyperlipidemia, depression, anxiety, schizoaffective disorder, GERD, who presents from group home for chief concerns of choking on a piece of steak. ? ?In the emergency department, emergency medicine provider was able to retrieve foreign body successfully. ? ?Vitals in the emergency department showed temperature of 98, respiration rate 24, heart rate 116, blood pressure 176/85, SPO2 of 95% on nonrebreather and was increased to high flow nasal cannula. ? ?Labs in the emergency department showed serum sodium 139, potassium 3.6, chloride 104, bicarb 25, nonfasting blood glucose 139, BUN of 20, serum creatinine of 1.13, EGFR greater than 60, WBC elevated at 12, hemoglobin 12.6, platelets of 248.  Lactic acid was elevated at 2.6. ? ?COVID/influenza A/influenza B PCR were negative. ? ?Blood cultures x2 have been ordered. ? ?Portable chest x-ray was read as bilateral airspace disease, left greater than the right concerning for pneumonia. ? ?ED treatment: Ceftriaxone 2 g, azithromycin 500 mg, Flagyl 500 mg IV one-time dose, sodium chloride 1 L bolus. ?

## 2021-04-30 NOTE — ED Notes (Signed)
EDP and nursing staff to bedside, suctioning pt, pt coughed up large piece of steak.  ?

## 2021-04-30 NOTE — ED Triage Notes (Signed)
Pt to ED via EMS from group home, apparently pt was eating dinner and choked on a piece of steak, on EMS arrival pt was conscious but obviously choking, and difficulty maintaining airway Staff and medics attempted [facility] not successful. . #20 LAC. No medications given by EMS.  ?

## 2021-04-30 NOTE — ED Provider Notes (Addendum)
? ?[facility] ?Provider Note ? ? ? Event Date/Time  ? First MD Initiated Contact with Patient 04/30/21 1903   ?  (approximate) ? ? ?History  ? ?Choking and Swallowed Foreign Body (Steak) ? ? ?HPI ? ?Christopher Armond. is a 61 y.o. male with a history of GERD, hypertension, schizoaffective disorder who was sent to the ED from his group home due to choking.  He was eating dinner and suddenly started having trouble breathing.  He did not lose consciousness.  EMS attempted Heimlich maneuver several times without success.  They transported patient on nonrebreather and found that this maintained his oxygen saturation at about 90%. ? ?  ? ? ?Physical Exam  ? ?Triage Vital Signs: ?ED Triage Vitals  ?Enc Vitals Group  ?   BP 04/30/21 1900 (!) 176/85  ?   Pulse Rate 04/30/21 1900 (!) 116  ?   Resp 04/30/21 1900 (!) 24  ?   Temp 04/30/21 1900 98 ?F (36.7 ?C)  ?   Temp Source 04/30/21 1900 Axillary  ?   SpO2 04/30/21 1900 95 %  ?   Weight 04/30/21 1903 171 lb (77.6 kg)  ?   Height 04/30/21 1903 6\' 1"  (1.854 m)  ?   Head Circumference --   ?   Peak Flow --   ?   Pain Score 04/30/21 1903 0  ?   Pain Loc --   ?   Pain Edu? --   ?   Excl. in GC? --   ? ? ?Most recent vital signs: ?Vitals:  ? 04/30/21 1900  ?BP: (!) 176/85  ?Pulse: (!) 116  ?Resp: (!) 24  ?Temp: 98 ?F (36.7 ?C)  ?SpO2: 95%  ? ? ? ?General: Awake, apparent distress ?CV:  Good peripheral perfusion.  Tachycardia heart rate 110 ?Resp:  Normal effort.  Bilateral stridor, poor air movement. ?Abd:  No distention.  Soft nontender ?Other:  Dry mucous membranes ? ? ?ED Results / Procedures / Treatments  ? ?Labs ?(all labs ordered are listed, but only abnormal results are displayed) ?Labs Reviewed  ?CBC WITH DIFFERENTIAL/PLATELET - Abnormal; Notable for the following components:  ?    Result Value  ? WBC 12.0 (*)   ? Hemoglobin 12.6 (*)   ? Abs Immature Granulocytes 0.24 (*)   ? All other components within normal limits  ?RESP PANEL BY RT-PCR (FLU A&B,  COVID) [id number]  ?CULTURE, BLOOD (ROUTINE X 2)  ?CULTURE, BLOOD (ROUTINE X 2)  ?COMPREHENSIVE METABOLIC PANEL  ?LACTIC ACID, PLASMA  ?LACTIC ACID, PLASMA  ? ? ? ?EKG ? ?Interpreted by me ?Sinus tachycardia rate 114.  Normal axis, normal intervals.  Poor R wave progression.  Normal ST segments and T waves. ? ? ?RADIOLOGY ?Chest x-ray viewed and interpreted by me shows consolidation of the left lower lung consistent with pneumonia.  Radiology report reviewed ? ? ? ?PROCEDURES: ? ?Critical Care performed: Yes, see critical care procedure note(s) ? ?.Critical Care ?Performed by: 05/02/21, MD ?Authorized by: Sharman Cheek, MD  ? ?Critical care provider statement:  ?  Critical care time (minutes):  35 ?  Critical care time was exclusive of:  Separately billable procedures and treating other patients ?  Critical care was necessary to treat or prevent imminent or life-threatening deterioration of the following conditions:  Sepsis and respiratory failure ?  Critical care was time spent personally by me on the following activities:  Development of treatment plan with patient or surrogate, discussions with  consultants, evaluation of patient's response to treatment, examination of patient, obtaining history from patient or surrogate, ordering and performing treatments and interventions, ordering and review of laboratory studies, ordering and review of radiographic studies, pulse oximetry, re-evaluation of patient's condition and review of old charts ?Comments:  ?    ? ? ? ? ?.1-3 Lead EKG Interpretation ?Performed by: Sharman Cheek, MD ?Authorized by: Sharman Cheek, MD  ? ?  Interpretation: abnormal   ?  ECG rate:  115 ?  ECG rate assessment: tachycardic   ?  Rhythm: sinus rhythm   ?  Ectopy: none   ?  Conduction: normal   ?.Foreign Body Removal ? ?Date/Time: 04/30/2021 9:43 PM ?Performed by: Sharman Cheek, MD ?Authorized by: Sharman Cheek, MD  ?Consent: The procedure was performed in an emergent  situation. ?Body area: throat ?Complexity: simple ?1 objects recovered. ?Objects recovered: large piece of partially chewed meat ?Post-procedure assessment: foreign body removed ?Patient tolerance: patient tolerated the procedure well with no immediate complications ?Comments: Lung aeration improved after foreign body removal ? ? ? ?MEDICATIONS ORDERED IN ED: ?Medications  ?cefTRIAXone (ROCEPHIN) 2 g in sodium chloride 0.9 % 100 mL IVPB (has no administration in time range)  ?azithromycin (ZITHROMAX) 500 mg in sodium chloride 0.9 % 250 mL IVPB (has no administration in time range)  ?metroNIDAZOLE (FLAGYL) IVPB 500 mg (has no administration in time range)  ?sodium chloride 0.9 % bolus 1,000 mL (has no administration in time range)  ? ? ? ?IMPRESSION / MDM / ASSESSMENT AND PLAN / ED COURSE  ?I reviewed the triage vital signs and the nursing notes. ?             ?               ? ?Differential diagnosis includes, but is not limited to, aspiration with choking, pneumonia, pulmonary edema, pneumothorax, dehydration ? ?**The patient is on the cardiac monitor to evaluate for evidence of arrhythmia and/or significant heart rate changes.**} ? ?Patient presents with respiratory distress, apparent choking.  During initial exam and preparations for intubation, patient had a forceful cough and produced a large piece of intact meat.  This was removed from his mouth.  Repeat auscultation shows greatly improved air movement with diffuse rhonchi.  Patient reports he feels better.  Oxygen saturation improved to 96% on nonrebreather oxygen. ?Clinical Course as of 04/30/21 2127  ?Fri Apr 30, 2021  ?2127 Initial lactate 2.6.  We will need to trend.  He is tolerating high flow nasal cannula well, maintaining high oxygen saturation.  Tachycardia normalized after IV fluids.  Blood pressure remains normal.  No signs of shock. [PS]  ?  ?Clinical Course User Index ?[PS] Sharman Cheek, MD  ? ? ? ?FINAL CLINICAL IMPRESSION(S) / ED DIAGNOSES   ? ?Final diagnoses:  ?Aspiration pneumonia of left lower lobe, unspecified aspiration pneumonia type (HCC)  ?Sepsis with acute hypoxic respiratory failure without septic shock, due to unspecified organism [facility])  ? ? ? ?Rx / DC Orders  ? ?ED Discharge Orders   ? ? None  ? ?  ? ? ? ?Note:  This document was prepared using Dragon voice recognition software and may include unintentional dictation errors. ?  ?Sharman Cheek, MD ?04/30/21 2003 ? ?  ?Sharman Cheek, MD ?04/30/21 2145 ? ?

## 2021-05-01 ENCOUNTER — Other Ambulatory Visit: Payer: Self-pay

## 2021-05-01 DIAGNOSIS — Z79899 Other long term (current) drug therapy: Secondary | ICD-10-CM | POA: Diagnosis not present

## 2021-05-01 DIAGNOSIS — Z20822 Contact with and (suspected) exposure to covid-19: Secondary | ICD-10-CM | POA: Diagnosis present

## 2021-05-01 DIAGNOSIS — I10 Essential (primary) hypertension: Secondary | ICD-10-CM | POA: Diagnosis present

## 2021-05-01 DIAGNOSIS — J9601 Acute respiratory failure with hypoxia: Secondary | ICD-10-CM | POA: Diagnosis present

## 2021-05-01 DIAGNOSIS — E785 Hyperlipidemia, unspecified: Secondary | ICD-10-CM | POA: Diagnosis present

## 2021-05-01 DIAGNOSIS — T189XXA Foreign body of alimentary tract, part unspecified, initial encounter: Secondary | ICD-10-CM | POA: Diagnosis present

## 2021-05-01 DIAGNOSIS — Z820 Family history of epilepsy and other diseases of the nervous system: Secondary | ICD-10-CM | POA: Diagnosis not present

## 2021-05-01 DIAGNOSIS — J189 Pneumonia, unspecified organism: Secondary | ICD-10-CM | POA: Diagnosis present

## 2021-05-01 DIAGNOSIS — F258 Other schizoaffective disorders: Secondary | ICD-10-CM | POA: Diagnosis present

## 2021-05-01 DIAGNOSIS — K219 Gastro-esophageal reflux disease without esophagitis: Secondary | ICD-10-CM | POA: Diagnosis present

## 2021-05-01 DIAGNOSIS — L89301 Pressure ulcer of unspecified buttock, stage 1: Secondary | ICD-10-CM | POA: Diagnosis present

## 2021-05-01 DIAGNOSIS — A419 Sepsis, unspecified organism: Secondary | ICD-10-CM | POA: Diagnosis present

## 2021-05-01 DIAGNOSIS — Z87442 Personal history of urinary calculi: Secondary | ICD-10-CM | POA: Diagnosis not present

## 2021-05-01 DIAGNOSIS — J690 Pneumonitis due to inhalation of food and vomit: Secondary | ICD-10-CM | POA: Diagnosis present

## 2021-05-01 DIAGNOSIS — R6520 Severe sepsis without septic shock: Secondary | ICD-10-CM | POA: Diagnosis present

## 2021-05-01 DIAGNOSIS — F32A Depression, unspecified: Secondary | ICD-10-CM | POA: Diagnosis present

## 2021-05-01 DIAGNOSIS — F17210 Nicotine dependence, cigarettes, uncomplicated: Secondary | ICD-10-CM | POA: Diagnosis present

## 2021-05-01 DIAGNOSIS — Y92009 Unspecified place in unspecified non-institutional (private) residence as the place of occurrence of the external cause: Secondary | ICD-10-CM | POA: Diagnosis not present

## 2021-05-01 DIAGNOSIS — Z8701 Personal history of pneumonia (recurrent): Secondary | ICD-10-CM | POA: Diagnosis present

## 2021-05-01 DIAGNOSIS — F419 Anxiety disorder, unspecified: Secondary | ICD-10-CM | POA: Diagnosis present

## 2021-05-01 DIAGNOSIS — K5909 Other constipation: Secondary | ICD-10-CM | POA: Diagnosis present

## 2021-05-01 MED ORDER — SODIUM CHLORIDE 0.9 % IV SOLN
1.5000 g | Freq: Four times a day (QID) | INTRAVENOUS | Status: DC
  Administered 2021-05-01 – 2021-05-02 (×5): 1.5 g via INTRAVENOUS
  Filled 2021-05-01 (×3): qty 4
  Filled 2021-05-01 (×2): qty 1.5
  Filled 2021-05-01: qty 4

## 2021-05-01 NOTE — ED Notes (Signed)
RN aware of bed assignment 

## 2021-05-01 NOTE — ED Notes (Signed)
Speech swallow at bedside.

## 2021-05-01 NOTE — Progress Notes (Signed)
?PROGRESS NOTE ? ? ? ?Christopher Valdez.  KDT:[id number] DOB: Dec 30, 1960 DOA: 04/30/2021 ?PCP: Alba Cory, MD  ? ? ?Brief Narrative:  ?61 year old with history of hyperlipidemia, depression, anxiety, schizoaffective disorder , GERD from a group home brought to the ER with choking on a piece of steak.  This was suctioned out in the ER, he was hypoxemic so admitted to the hospital. ? ? ?Assessment & Plan: ?  ?Sepsis present on admission, bilateral lower lobe aspiration pneumonia left more than right secondary to choking on steak that is removed in the emergency room.  Acute hypoxemic respiratory failure secondary to aspiration, initially on 30 L of oxygen now able to wean off to room air. ?Sepsis present due to following conditions. ?Sepsis present as the patient has 2 of the following   ?Heart rate 116 ?WBC count >12000 ,  ?Patient with severe respiratory distress requiring 30 L of oxygen. ?And suspect his source of infections is bilateral lower lobe pneumonia. ? ?Quick reversal, steak was removed while suctioning in the ER and intact.  Able to wean off to room air today.  Seen by speech therapy, currently on pur?ed diet.  Advance as tolerated. ?Patient received Rocephin azithromycin and Flagyl.  Will treat with monotherapy injection Unasyn today.  If adequate improvement, can probably change to oral antibiotics by tomorrow for discharge. ?Dietary precautions are explained to the patient.  ? ?Schizoaffective disorder: Well controlled on Depakote, paroxetine, clozapine and also on valbenazine that will be continued.  He is on a scheduled laxative for constipation that he will continue.  He is on PPI. ? ?Smoker: Less likely to quit.  Nicotine patch. ? ? ?DVT prophylaxis: enoxaparin (LOVENOX) injection 40 mg Start: 05/01/21 0800 ?Place TED hose Start: 04/30/21 2140 ? ? ?Code Status: Full code ?Family Communication: Karie Kirks form the contact list ?Disposition Plan: Status is: Inpatient ?Remains inpatient  appropriate because: Aspiration pneumonia, on IV antibiotics. ?  ? ? ?Consultants:  ?Speech ? ?Procedures:  ?None ? ?Antimicrobials:  ?Rocephin and azithromycin 4/21-- ?Augmentin 4/22--- ? ? ?Subjective: ?Patient seen and examined.  He was still in the emergency room.  Patient tells me that he is going to take extreme precaution next time he eats any meat.  He is going to chop it.  Patient was on high flow oxygen, however he was able to be weaned off to the room air.  Remains afebrile. ?He does have some chest wall pain, worse with coughing but denies any other complaints. ? ?Objective: ?Vitals:  ? 05/01/21 0930 05/01/21 1000 05/01/21 1015 05/01/21 1118  ?BP: (!) 143/80 115/61  (!) 145/73  ?Pulse: 85 88 85 86  ?Resp: (!) 26 (!) 26 20 18   ?Temp:    98.8 ?F (37.1 ?C)  ?TempSrc:      ?SpO2: 96% 93% 93% 94%  ?Weight:      ?Height:      ? ? ?Intake/Output Summary (Last 24 hours) at 05/01/2021 1135 ?Last data filed at 04/30/2021 2320 ?Gross per 24 hour  ?Intake 2093.7 ml  ?Output --  ?Net 2093.7 ml  ? ?Filed Weights  ? 04/30/21 1903  ?Weight: 77.6 kg  ? ? ?Examination: ? ?General exam: Appears calm and comfortable , able to come off to room air. ?Patient is alert and oriented.  He is appropriately answering questions. ?Respiratory system: Clear to auscultation. Respiratory effort normal.  Some conducted upper airway sounds. ?Cardiovascular system: S1 & S2 heard, RRR.  ?Gastrointestinal system: Abdomen is nondistended, soft and nontender. No  organomegaly or masses felt. Normal bowel sounds heard. ?Central nervous system: Alert and oriented. No focal neurological deficits. ? ? ? ?Data Reviewed: I have personally reviewed following labs and imaging studies ? ?CBC: ?Recent Labs  ?Lab 04/30/21 ?1917  ?WBC 12.0*  ?NEUTROABS 7.4  ?HGB 12.6*  ?HCT 40.0  ?MCV 94.6  ?PLT 248  ? ?Basic Metabolic Panel: ?Recent Labs  ?Lab 04/30/21 ?1917  ?NA 139  ?K 3.6  ?CL 104  ?CO2 25  ?GLUCOSE 139*  ?BUN 20  ?CREATININE 1.13  ?CALCIUM 9.0   ? ?GFR: ?Estimated Creatinine Clearance: 74.4 mL/min (by C-G formula based on SCr of 1.13 mg/dL). ?Liver Function Tests: ?Recent Labs  ?Lab 04/30/21 ?1917  ?AST 35  ?ALT 28  ?ALKPHOS 88  ?BILITOT 0.5  ?PROT 6.8  ?ALBUMIN 3.9  ? ?No results for input(s): LIPASE, AMYLASE in the last 168 hours. ?No results for input(s): AMMONIA in the last 168 hours. ?Coagulation Profile: ?No results for input(s): INR, PROTIME in the last 168 hours. ?Cardiac Enzymes: ?No results for input(s): CKTOTAL, CKMB, CKMBINDEX, TROPONINI in the last 168 hours. ?BNP (last 3 results) ?No results for input(s): PROBNP in the last 8760 hours. ?HbA1C: ?No results for input(s): HGBA1C in the last 72 hours. ?CBG: ?No results for input(s): GLUCAP in the last 168 hours. ?Lipid Profile: ?No results for input(s): CHOL, HDL, LDLCALC, TRIG, CHOLHDL, LDLDIRECT in the last 72 hours. ?Thyroid Function Tests: ?No results for input(s): TSH, T4TOTAL, FREET4, T3FREE, THYROIDAB in the last 72 hours. ?Anemia Panel: ?No results for input(s): VITAMINB12, FOLATE, FERRITIN, TIBC, IRON, RETICCTPCT in the last 72 hours. ?Sepsis Labs: ?Recent Labs  ?Lab 04/30/21 ?1917 04/30/21 ?2007 04/30/21 ?2200  ?PROCALCITON <0.10  --   --   ?LATICACIDVEN  --  2.6* 1.9  ? ? ?Recent Results (from the past 240 hour(s))  ?Resp Panel by RT-PCR (Flu A&B, Covid) Nasopharyngeal Swab     Status: None  ? Collection Time: 04/30/21  7:17 PM  ? Specimen: Nasopharyngeal Swab; Nasopharyngeal(NP) swabs in vial transport medium  ?Result Value Ref Range Status  ? SARS Coronavirus 2 by RT PCR NEGATIVE NEGATIVE Final  ?  Comment: (NOTE) ?SARS-CoV-2 target nucleic acids are NOT DETECTED. ? ?The SARS-CoV-2 RNA is generally detectable in upper respiratory ?specimens during the acute phase of infection. The lowest ?concentration of SARS-CoV-2 viral copies this assay can detect is ?138 copies/mL. A negative result does not preclude SARS-Cov-2 ?infection and should not be used as the sole basis for treatment  or ?other patient management decisions. A negative result may occur with  ?improper specimen collection/handling, submission of specimen other ?than nasopharyngeal swab, presence of viral mutation(s) within the ?areas targeted by this assay, and inadequate number of viral ?copies(<138 copies/mL). A negative result must be combined with ?clinical observations, patient history, and epidemiological ?information. The expected result is Negative. ? ?Fact Sheet for Patients:  ?BloggerCourse.com ? ?Fact Sheet for Healthcare Providers:  ?SeriousBroker.it ? ?This test is no t yet approved or cleared by the [place] FDA and  ?has been authorized for detection and/or diagnosis of SARS-CoV-2 by ?FDA under an Emergency Use Authorization (EUA). This EUA will remain  ?in effect (meaning this test can be used) for the duration of the ?COVID-19 declaration under Section 564(b)(1) of the Act, 21 ?U.S.C.section 360bbb-3(b)(1), unless the authorization is terminated  ?or revoked sooner.  ? ? ?  ? Influenza A by PCR NEGATIVE NEGATIVE Final  ? Influenza B by PCR NEGATIVE NEGATIVE Final  ?  Comment: (NOTE) ?The Xpert Xpress SARS-CoV-2/FLU/RSV plus assay is intended as an aid ?in the diagnosis of influenza from Nasopharyngeal swab specimens and ?should not be used as a sole basis for treatment. Nasal washings and ?aspirates are unacceptable for Xpert Xpress SARS-CoV-2/FLU/RSV ?testing. ? ?Fact Sheet for Patients: ?BloggerCourse.comhttps://www.fda.gov/media/152166/download ? ?Fact Sheet for Healthcare Providers: ?SeriousBroker.ithttps://www.fda.gov/media/152162/download ? ?This test is not yet approved or cleared by the [place] FDA and ?has been authorized for detection and/or diagnosis of SARS-CoV-2 by ?FDA under an Emergency Use Authorization (EUA). This EUA will remain ?in effect (meaning this test can be used) for the duration of the ?COVID-19 declaration under Section 564(b)(1) of the Act, 21 U.S.C. ?section  360bbb-3(b)(1), unless the authorization is terminated or ?revoked. ? ?Performed at [facility], [address] [place], ?[place] [id number] ?  ?Culture, blood (routine x 2)     Status: None (Preliminary result)

## 2021-05-01 NOTE — Evaluation (Signed)
Clinical/Bedside Swallow Evaluation ?Patient Details  ?Name: Christopher Valdez. ?MRN: [id number] ?Date of Birth: 12/04/1960 ? ?Today's Date: 05/01/2021 ?Time: SLP Start Time (ACUTE ONLY): 0920 SLP Stop Time (ACUTE ONLY): 1000 ?SLP Time Calculation (min) (ACUTE ONLY): 40 min ? ?Past Medical History:  ?Past Medical History:  ?Diagnosis Date  ? Calculus of kidney   ? Chronic constipation   ? Dyslipidemia   ? GERD (gastroesophageal reflux disease)   ? Proteinuria   ? Schizo-affective schizophrenia (HCC)   ? Vitamin D deficiency   ? ?Past Surgical History:  ?Past Surgical History:  ?Procedure Laterality Date  ? COLONOSCOPY  07/02/2012  ? CYSTOSCOPY W/ RETROGRADES Right 05/10/2019  ? Procedure: CYSTOSCOPY WITH RETROGRADE PYELOGRAM;  Surgeon: Sondra Come, MD;  Location: [facility] ORS;  Service: Urology;  Laterality: Right;  ? CYSTOSCOPY/URETEROSCOPY/HOLMIUM LASER/STENT PLACEMENT Right 05/10/2019  ? Procedure: CYSTOSCOPY/URETEROSCOPY/HOLMIUM LASER/STENT PLACEMENT;  Surgeon: Sondra Come, MD;  Location: [facility] ORS;  Service: Urology;  Laterality: Right;  ? ?HPI:  ?Per admitting H&P: [name] is a 61 year old male with history of hyperlipidemia, depression, anxiety, schizoaffective disorder, GERD, who presents from group home for chief concerns of choking on a piece of steak.     In the emergency department, emergency medicine provider was able to retrieve foreign body successfully.     Vitals in the emergency department showed temperature of 98, respiration rate 24, heart rate 116, blood pressure 176/85, SPO2 of 95% on nonrebreather and was increased to high flow nasal cannula  ?  ?Assessment / Plan / Recommendation  ?Clinical Impression ? Pt sent to [facility] ED from group home after "choking" on a piece of steak. ED provider was able to retrieve a large piece of steak. Pt recalls the event and reports he took a large bite because he didnt want to have to cut it into a smaller piece. He reports he must have blacked out because  when he awoke he was in the hospital. He reports occasional diffcuty swallowing but never to this extent. Poor dentition with several teeth missing. Pt was hesitant to take PO's but with encouragement was able to participate with bedside swallow eval. Pt tolerated thin liquids by cup and straw single sips, applesauce, and pieces of graham cracker without s/s of aspiration. Pt took extended time to chew. Pt reports right chest pain with any movement and with swallowing, likely realted to trauma with airway obstruction yesterday. Pt could tolerate Dysphagia 2 finely chopped diet but when given the choice he preferred to start with pureed foods. Will order Dys 1 diet with thin liquids, meds crushed in aplpesauce. When discharged back to group home, Pt can have a finely chopped diet. Discussed with Nsg and Pt. St to follow up with toelration of diet Sunday and advance to Dys 2 if Pt is ready for upgrade. ?SLP Visit Diagnosis: Dysphagia, oropharyngeal phase (R13.12) ?   ?Aspiration Risk ? Moderate aspiration risk  ?  ?Diet Recommendation Dysphagia 1 (Puree);Dysphagia 2 (Fine chop)  ? ?Liquid Administration via: Straw ?Medication Administration: Crushed with puree ?Supervision: Staff to assist with self feeding;Full supervision/cueing for compensatory strategies ?Compensations: Minimize environmental distractions;Small sips/bites ?Postural Changes: Seated upright at 90 degrees;Remain upright for at least 30 minutes after po intake  ?  ?Other  Recommendations   Group home  ? ?Recommendations for follow up therapy are one component of a multi-disciplinary discharge planning process, led by the attending physician.  Recommendations may be updated based on patient status, additional functional criteria and  insurance authorization. ? ?Follow up Recommendations    ?ST to follow up with toleration of diet ? ?  ?Assistance Recommended at Discharge Intermittent Supervision/Assistance  ?Functional Status Assessment    ?Frequency  and Duration min 1 x/week  ? Follow up 1-2x ?  ?   ? ?Prognosis Prognosis for Safe Diet Advancement: Fair  ? ?  ? ?Swallow Study   ?General Date of Onset: 04/30/21 ?HPI: Per admitting H&P: [name] is a 61 year old male with history of hyperlipidemia, depression, anxiety, schizoaffective disorder, GERD, who presents from group home for chief concerns of choking on a piece of steak.     In the emergency department, emergency medicine provider was able to retrieve foreign body successfully.     Vitals in the emergency department showed temperature of 98, respiration rate 24, heart rate 116, blood pressure 176/85, SPO2 of 95% on nonrebreather and was increased to high flow nasal cannula ?Type of Study: Bedside Swallow Evaluation ?Diet Prior to this Study: Regular ?Temperature Spikes Noted: No ?Respiratory Status: Room air ?History of Recent Intubation: No ?Behavior/Cognition: Alert;Cooperative;Pleasant mood ?Oral Cavity Assessment: Excessive secretions ?Oral Care Completed by SLP: No ?Oral Cavity - Dentition: Poor condition;Missing dentition ?Vision: Functional for self-feeding ?Self-Feeding Abilities: Needs assist;Needs set up ?Patient Positioning: Upright in bed ?Baseline Vocal Quality: Hoarse ?Volitional Cough: Strong  ?  ?Oral/Motor/Sensory Function Overall Oral Motor/Sensory Function: Within functional limits   ?Ice Chips Ice chips: Within functional limits ?Presentation: Spoon   ?Thin Liquid Thin Liquid: Within functional limits ?Presentation: Cup;Spoon;Straw  ?  ?Nectar Thick Nectar Thick Liquid: Not tested   ?Honey Thick Honey Thick Liquid: Not tested   ?Puree Puree: Within functional limits ?Presentation: Spoon   ?Solid ? ? ?  Solid: Impaired ?Presentation: Self Fed ?Oral Phase Functional Implications: Oral residue;Impaired mastication;Prolonged oral transit  ? ?  ? ?Eather Colas ?05/01/2021,10:02 AM ? ? ? ?

## 2021-05-02 DIAGNOSIS — L8990 Pressure ulcer of unspecified site, unspecified stage: Secondary | ICD-10-CM | POA: Insufficient documentation

## 2021-05-02 LAB — COMPREHENSIVE METABOLIC PANEL
ALT: 20 U/L (ref 0–44)
AST: 19 U/L (ref 15–41)
Albumin: 3.4 g/dL — ABNORMAL LOW (ref 3.5–5.0)
Alkaline Phosphatase: 66 U/L (ref 38–126)
Anion gap: 7 (ref 5–15)
BUN: 21 mg/dL (ref 8–23)
CO2: 28 mmol/L (ref 22–32)
Calcium: 8.8 mg/dL — ABNORMAL LOW (ref 8.9–10.3)
Chloride: 108 mmol/L (ref 98–111)
Creatinine, Ser: 0.88 mg/dL (ref 0.61–1.24)
GFR, Estimated: >60 mL/min (ref >60)
Glucose, Bld: 97 mg/dL (ref 70–99)
Potassium: 3.8 mmol/L (ref 3.5–5.1)
Sodium: 143 mmol/L (ref 135–145)
Total Bilirubin: 0.7 mg/dL (ref 0.3–1.2)
Total Protein: 5.9 g/dL — ABNORMAL LOW (ref 6.5–8.1)

## 2021-05-02 LAB — GLUCOSE, CAPILLARY: Glucose-Capillary: 94 mg/dL (ref 70–99)

## 2021-05-02 LAB — MAGNESIUM: Magnesium: 1.7 mg/dL (ref 1.7–2.4)

## 2021-05-02 LAB — PHOSPHORUS: Phosphorus: 2 mg/dL — ABNORMAL LOW (ref 2.5–4.6)

## 2021-05-02 MED ORDER — GUAIFENESIN 100 MG/5ML PO LIQD
5.0000 mL | ORAL | Status: DC | PRN
Start: 2021-05-02 — End: 2021-05-02

## 2021-05-02 MED ORDER — ALBUTEROL SULFATE HFA 108 (90 BASE) MCG/ACT IN AERS
2.0000 | INHALATION_SPRAY | Freq: Four times a day (QID) | RESPIRATORY_TRACT | Status: DC
  Filled 2021-05-02: qty 6.7

## 2021-05-02 MED ORDER — SENNOSIDES-DOCUSATE SODIUM 8.6-50 MG PO TABS
1.0000 | ORAL_TABLET | Freq: Every evening | ORAL | Status: DC | PRN
Start: 2021-05-02 — End: 2021-05-02

## 2021-05-02 MED ORDER — AMOXICILLIN-POT CLAVULANATE 875-125 MG PO TABS: 1.0000 | ORAL_TABLET | Freq: Two times a day (BID) | ORAL | 0 refills | Status: AC

## 2021-05-02 MED ORDER — ALBUTEROL SULFATE HFA 108 (90 BASE) MCG/ACT IN AERS: 2.0000 | INHALATION_SPRAY | Freq: Four times a day (QID) | RESPIRATORY_TRACT | 2 refills | Status: AC | PRN

## 2021-05-02 MED ORDER — TRAZODONE HCL 50 MG PO TABS
50.0000 mg | ORAL_TABLET | Freq: Every evening | ORAL | Status: DC | PRN
Start: 2021-05-02 — End: 2021-05-02

## 2021-05-02 MED ORDER — IPRATROPIUM-ALBUTEROL 0.5-2.5 (3) MG/3ML IN SOLN: 3.0000 mL | RESPIRATORY_TRACT | Status: DC | PRN

## 2021-05-02 NOTE — Discharge Summary (Signed)
Physician Discharge Summary  ?Christopher Liv. [id number] DOB: 1960-10-22 DOA: 04/30/2021 ? ?PCP: Steele Sizer, MD ? ?Admit date: 04/30/2021 ?Discharge date: 05/02/2021 ? ?Admitted From: Group home ?Disposition: Group home ? ?Recommendations for Outpatient Follow-up:  ?Follow up with PCP in 1-2 weeks ?Please obtain BMP/CBC in one week your next doctors visit.  ?Oral Augmentin and albuterol inhaler prescribed ?Dietary recommendations as listed below ? ? ?Discharge Condition: Stable ?CODE STATUS: Full code ?Diet recommendation:  ?Aspiration Risk ?  Moderate aspiration risk  ?   ?Diet Recommendation Dysphagia 1 (Puree);Dysphagia 2 (Fine chop)  ?  ?Liquid Administration via: Straw ?Medication Administration: Crushed with puree ?Supervision: Staff to assist with self feeding;Full supervision/cueing for compensatory strategies ?Compensations: Minimize environmental distractions;Small sips/bites ?Postural Changes: Seated upright at 90 degrees;Remain upright for at least 30 minutes after po intake   ? ? ?Brief/Interim Summary: ?61 year old with history of hyperlipidemia, depression, anxiety, schizoaffective disorder , GERD from a group home brought to the ER with choking on a piece of steak.  This was suctioned out in the ER, he was hypoxemic so admitted to the hospital.  Patient was diagnosed with aspiration pneumonia therefore started initially on broad-spectrum antibiotics and switched to Unasyn.  He tolerated this well, saturating greater than 90% on room air.  He was eval by speech and swallow who recommended diet as mentioned above. ?Today patient is stable for discharge. ?  ?  ?Assessment & Plan: ?  ?Sepsis secondary to bilateral aspiration pneumonia, improved ?Acute hypoxia/respiratory failure due to aspiration, requiring 30 L nasal cannula at first ?-Foreign object was suctioned out in the ER, he started saturating better.  He was seen by speech and swallow therapist and recommendations administered above.   Transition IV Unasyn to oral Augmentin along with bronchodilators which has been prescribed upon discharge. ? ?Schizoaffective disorder-resume his home medications. ?  ?Smoker: Less likely to quit.  Nicotine patch. ? ? ? ?  ?Body mass index is 22.56 kg/m?. ? ?Pressure Injury 05/01/21 Buttocks Stage 1 -  Intact skin with non-blanchable redness of a localized area usually over a bony prominence. (Active)  ?05/01/21 1115  ?Location: Buttocks  ?Location Orientation:   ?Staging: Stage 1 -  Intact skin with non-blanchable redness of a localized area usually over a bony prominence.  ?Wound Description (Comments):   ?Present on Admission: Yes  ? ? ? ? ?Discharge Diagnoses:  ?Principal Problem: ?  Sepsis ([place]) ?Active Problems: ?  Other schizoaffective disorders ([place]) ?  Dyslipidemia ?  Chronic constipation ?  Tobacco use ?  GERD without esophagitis ?  Essential hypertension ?  Pneumonia ?  Pressure injury of skin ? ? ? ? ? ?Consultations: ?Speech and swallow evaluation ? ?Subjective: ?Feeling well no complaints.  Saturating greater than 90% on room air ? ?Discharge Exam: ?Vitals:  ? 05/02/21 0733 05/02/21 1118  ?BP: 130/67 127/61  ?Pulse: 84 91  ?Resp: 18 17  ?Temp: 99.3 ?F (37.4 ?C) 99 ?F (37.2 ?C)  ?SpO2: 91% 93%  ? ?Vitals:  ? 05/02/21 0021 05/02/21 0546 05/02/21 0733 05/02/21 1118  ?BP: (!) 142/72 138/70 130/67 127/61  ?Pulse: 90  84 91  ?Resp: 16 18 18 17   ?Temp: 98.8 ?F (37.1 ?C) 97.8 ?F (36.6 ?C) 99.3 ?F (37.4 ?C) 99 ?F (37.2 ?C)  ?TempSrc: Oral Oral Oral Oral  ?SpO2: 91% 92% 91% 93%  ?Weight:      ?Height:      ? ? ?General: Pt is alert, awake, not in acute distress ?Cardiovascular:  RRR, S1/S2 +, no rubs, no gallops ?Respiratory: CTA bilaterally, no wheezing, no rhonchi ?Abdominal: Soft, NT, ND, bowel sounds + ?Extremities: no edema, no cyanosis ? ?Discharge Instructions ? ? ?Allergies as of 05/02/2021   ? ?   Reactions  ? Tall Ragweed Other (See Comments)  ? Pollen  ? ?  ? ?  ?Medication List  ?  ? ?TAKE these  medications   ? ?acetaminophen 500 MG tablet ?Commonly known as: TYLENOL ?Take 1,000 mg by mouth every 4 (four) hours as needed for mild pain or headache. ?  ?albuterol 108 (90 Base) MCG/ACT inhaler ?Commonly known as: VENTOLIN HFA ?Inhale 2 puffs into the lungs every 6 (six) hours as needed for wheezing or shortness of breath. ?  ?amoxicillin-clavulanate 875-125 MG tablet ?Commonly known as: Augmentin ?Take 1 tablet by mouth every 12 (twelve) hours for 6 days. ?  ?cloZAPine 100 MG tablet ?Commonly known as: CLOZARIL ?Take 200 mg by mouth 2 (two) times daily. ?  ?divalproex 500 MG 24 hr tablet ?Commonly known as: DEPAKOTE ER ?Take 500 mg by mouth at bedtime. ?  ?FLUoxetine 40 MG capsule ?Commonly known as: PROZAC ?Take 40 mg by mouth daily. ?  ?Ingrezza 80 MG capsule ?Generic drug: valbenazine ?Take 80 mg by mouth daily. ?  ?omeprazole 40 MG capsule ?Commonly known as: PRILOSEC ?TAKE 1 CAPSULE BY MOUTH EVERY MORNING FOR REFLUX ?  ?polycarbophil 625 MG tablet ?Commonly known as: FIBERCON ?Take 625 mg by mouth daily. ?  ?simvastatin 20 MG tablet ?Commonly known as: ZOCOR ?TAKE 1 TABLET BY MOUTH DAILY ?  ?Vitamin D3 50 MCG (2000 UT) capsule ?TAKE 1 CAPSULE BY MOUTH DAILY ?  ? ?  ? ? Follow-up Information   ? ? Steele Sizer, MD Follow up.   ?Specialty: Family Medicine ?Contact information: ?[address] ?[address] ?[place] [address] ?[phone] ? ? ?  ?  ? ?  ?  ? ?  ? ?Allergies  ?Allergen Reactions  ? Tall Ragweed Other (See Comments)  ?  Pollen  ? ? ?You were cared for by a hospitalist during your hospital stay. If you have any questions about your discharge medications or the care you received while you were in the hospital after you are discharged, you can call the unit and asked to speak with the hospitalist on call if the hospitalist that took care of you is not available. Once you are discharged, your primary care physician will handle any further medical issues. Please note that no refills for any  discharge medications will be authorized once you are discharged, as it is imperative that you return to your primary care physician (or establish a relationship with a primary care physician if you do not have one) for your aftercare needs so that they can reassess your need for medications and monitor your lab values. ? ? ?Procedures/Studies: ?DG Chest Portable 1 View ? ?Result Date: 04/30/2021 ?CLINICAL DATA:  Food aspiration EXAM: PORTABLE CHEST 1 VIEW COMPARISON:  None. FINDINGS: Airspace disease throughout the left mid and lower lung and in the right infrahilar region. No effusions. No pneumothorax. Heart is normal size. No acute bony abnormality. IMPRESSION: Bilateral airspace disease, left greater than right concerning for pneumonia. Electronically Signed   By: Rolm Baptise M.D.   On: 04/30/2021 19:55   ? ? ?The results of significant diagnostics from this hospitalization (including imaging, microbiology, ancillary and laboratory) are listed below for reference.   ? ? ?Microbiology: ?Recent Results (from the past 240  hour(s))  ?Resp Panel by RT-PCR (Flu A&B, Covid) Nasopharyngeal Swab     Status: None  ? Collection Time: 04/30/21  7:17 PM  ? Specimen: Nasopharyngeal Swab; Nasopharyngeal(NP) swabs in vial transport medium  ?Result Value Ref Range Status  ? SARS Coronavirus 2 by RT PCR NEGATIVE NEGATIVE Final  ?  Comment: (NOTE) ?SARS-CoV-2 target nucleic acids are NOT DETECTED. ? ?The SARS-CoV-2 RNA is generally detectable in upper respiratory ?specimens during the acute phase of infection. The lowest ?concentration of SARS-CoV-2 viral copies this assay can detect is ?138 copies/mL. A negative result does not preclude SARS-Cov-2 ?infection and should not be used as the sole basis for treatment or ?other patient management decisions. A negative result may occur with  ?improper specimen collection/handling, submission of specimen other ?than nasopharyngeal swab, presence of viral mutation(s) within the ?areas  targeted by this assay, and inadequate number of viral ?copies(<138 copies/mL). A negative result must be combined with ?clinical observations, patient history, and epidemiological ?information. The expected resul

## 2021-05-02 NOTE — Discharge Instructions (Signed)
Aspiration Risk ?  Moderate aspiration risk  ?   ?Diet Recommendation Dysphagia 1 (Puree);Dysphagia 2 (Fine chop)  ?  ?Liquid Administration via: Straw ?Medication Administration: Crushed with puree ?Supervision: Staff to assist with self feeding;Full supervision/cueing for compensatory strategies ?Compensations: Minimize environmental distractions;Small sips/bites ?Postural Changes: Seated upright at 90 degrees;Remain upright for at least 30 minutes after po intake   ? ?

## 2021-05-02 NOTE — Progress Notes (Signed)
SLP Cancellation Note ? ?Patient Details ?Name: Christopher Valdez. ?MRN: [id number] ?DOB: December 20, 1960 ? ? ?Cancelled treatment:       Reason Eval/Treat Not Completed: Fatigue/lethargy limiting ability to participate  ? ?Chart review completed. Attempted diet tolerance and trials of upgraded textures with pt. Upon SLP entrance to room, pt sleeping soundly. Pt roused briefly. SLP introduced self and verbalized reason for visit. Pt declined stating he wanted to sleep despite education.  ? ?Will continue efforts as appropriate.  ? ?RN made aware of the above.  ? ?Time attempted: 0277-4128  ? ?Clyde Canterbury, M.S., CCC-SLP ?Speech-Language Pathologist ?[place] - [facility] ?[phone] (ASCOM)  ? ?[name] ?05/02/2021, 10:15 AM ?

## 2021-05-02 NOTE — TOC Initial Note (Addendum)
Transition of Care (TOC) - Initial/Assessment Note  ? ? ?Patient Details  ?Name: Christopher Valdez. ?MRN: [id number] ?Date of Birth: 03-20-1960 ? ?Transition of Care (TOC) CM/SW Contact:    ?Jaianna Nicoll E Taetum Flewellen, LCSW ?Phone Number: ?05/02/2021, 11:15 AM ? ?Clinical Narrative:                 ?Patient to DC back to [facility] today. ?Called and spoke with Mason at [facility] at [phone]. She checked with their Director who confirmed patient can come back today. [facility] staff to pick patient up around 2pm per RN request.  ?[facility] requested patient get his dose of antibiotics prior to discharge and they will pick up the remaining from [facility] tomorrow since they are closed today. MD agreed. RN aware.  ?Leavy Cella stated the only paperwork they need is a copy of the DC Summary including patient's new diet. Notified MD and RN.  ? ?Expected Discharge Plan: Group Home ?Barriers to Discharge: Barriers Resolved ? ? ?Patient Goals and CMS Choice ?  ?  ?  ? ?Expected Discharge Plan and Services ?Expected Discharge Plan: Group Home ?  ?  ?  ?  ?Expected Discharge Date: 05/02/21               ?  ?  ?  ?  ?  ?  ?  ?  ?  ?  ? ?Prior Living Arrangements/Services ?  ?Lives with:: Facility Resident ?  ?       ?  ?  ?  ?  ? ?Activities of Daily Living ?Home Assistive Devices/Equipment: None ?ADL Screening (condition at time of admission) ?Patient's cognitive ability adequate to safely complete daily activities?: Yes ?Is the patient deaf or have difficulty hearing?: No ?Does the patient have difficulty seeing, even when wearing glasses/contacts?: No ?Does the patient have difficulty concentrating, remembering, or making decisions?: Yes ?Patient able to express need for assistance with ADLs?: Yes ?Does the patient have difficulty dressing or bathing?: No ?Independently performs ADLs?: Yes (appropriate for developmental age) ?Does the patient have difficulty walking or climbing stairs?: Yes ?Weakness of Legs:  Both ?Weakness of Arms/Hands: None ? ?Permission Sought/Granted ?  ?  ?   ?   ?   ?   ? ?Emotional Assessment ?  ?  ?  ?  ?  ?  ? ?Admission diagnosis:  Pneumonia [J18.9] ?Sepsis (HCC) [A41.9] ?Aspiration pneumonia of left lower lobe, unspecified aspiration pneumonia type (HCC) [J69.0] ?Sepsis with acute hypoxic respiratory failure without septic shock, due to unspecified organism (HCC) [A41.9, R65.20, J96.01] ?Patient Active Problem List  ? Diagnosis Date Noted  ? Pressure injury of skin 05/02/2021  ? Pneumonia 05/01/2021  ? Sepsis (HCC) 04/30/2021  ? Essential hypertension 02/17/2020  ? Other disorders of kidney and ureter in diseases classified elsewhere 02/17/2020  ? Smokers' cough (HCC) 04/09/2019  ? Hydronephrosis of right kidney 04/03/2018  ? Right kidney stone 04/03/2018  ? Avitaminosis D 10/29/2014  ? Enlarged prostate 10/29/2014  ? Other schizoaffective disorders (HCC) 10/10/2014  ? Proteinuria 10/10/2014  ? Hyperglycemia 10/10/2014  ? Dyslipidemia 10/10/2014  ? History of kidney stones 10/10/2014  ? External hemorrhoid 10/10/2014  ? Chronic constipation 10/10/2014  ? Tobacco use 10/10/2014  ? GERD without esophagitis 10/10/2014  ? ?PCP:  Alba Cory, MD ?Pharmacy:   ?[facility] - [place], [place] - [address] ?[address] ?[address] ?[place] [address] ?Phone: [phone] Fax: [phone] ? ? ? ? ?Social Determinants of  Health (SDOH) Interventions ?  ? ?Readmission Risk Interventions ?   ? View : No data to display.  ?  ?  ?  ? ? ? ?

## 2021-05-02 NOTE — Progress Notes (Signed)
Speech Language Pathology Treatment:    ?Patient Details ?Name: Christopher Valdez. ?MRN: [id number] ?DOB: 04-11-1960 ?Today's Date: 05/02/2021 ?Time: 5573-2202 ?SLP Time Calculation (min) (ACUTE ONLY): 10 min ? ?Assessment / Plan / Recommendation ?Clinical Impression ? Pt seen for diet tolerance and trials of upgraded textures. Pt received sleeping in room with drool pooled on front of hospital gown. Pt roused easily to voice and agreeable to participate in SLP.  ? ?Per chart review, no recent CBC. Temp 99. No recent chest imaging. Pt on room air.  ? ?Pt observed with trials of pureed (~2 oz) and thin liquids (via straw; ~4 oz). Pt able to feed self. Pt demonstrated an intact oral swallow with trials of pureed and thin liquids. Pt declined trials of soft solid and verbalized apprehension towards upgraded textures stating, "I don't want to choke." Pt verbalized desire to continue a pureed diet with thin liquids.  ? ?Recommend continuation of a pureed diet with thin liquids and safe swallowing strategies/aspiration precautions as outlined below. Per SLP note, 05/01/21, pt OK to advance diet to finely chopped at d/c from SLP perspective. If pt d/c's prior to SLP re-evaluation, please upgrade diet tolerated to finely chopped at next level of care. ? ?If s/sx dysphagia persist at d/c, pt may benefit from instrumental swallowing evaluation and/or GI work up. ? ?SLP to f/u for trials of upgraded textures as appropriate.  ? ?Pt and RN made aware of results, recommendations, and SLP POC. Pt verbalized understanding/agreement.  ? ?  ?HPI HPI: Per admitting H&P: [name] is a 61 year old male with history of hyperlipidemia, depression, anxiety, schizoaffective disorder, GERD, who presents from group home for chief concerns of choking on a piece of steak.     In the emergency department, emergency medicine provider was able to retrieve foreign body successfully.     Vitals in the emergency department showed temperature of 98,  respiration rate 24, heart rate 116, blood pressure 176/85, SPO2 of 95% on nonrebreather and was increased to high flow nasal cannula ?  ?   ?SLP Plan ? Continue with current plan of care ? ?  ?  ?Recommendations for follow up therapy are one component of a multi-disciplinary discharge planning process, led by the attending physician.  Recommendations may be updated based on patient status, additional functional criteria and insurance authorization. ?  ? ?Recommendations  ?Diet recommendations: Dysphagia 1 (puree);Thin liquid; can be advanced as tolerated to Dysphagia 2 (finely chopped) ?Medication Administration: Crushed with puree ?Supervision: Patient able to self feed ?Compensations: Minimize environmental distractions;Small sips/bites;Slow rate (reflux precautions) ?Postural Changes and/or Swallow Maneuvers: Seated upright 90 degrees;Upright 30-60 min after meal  ?   ? ? ? ? Oral Care Recommendations: Oral care BID ?Follow Up Recommendations: Follow physician's recommendations for discharge plan and follow up therapies ?Assistance recommended at discharge: Intermittent Supervision/Assistance ?SLP Visit Diagnosis: Dysphagia, oropharyngeal phase (R13.12) ?Plan: Continue with current plan of care ? ? ? ? ?  ?  ? ?Christopher Valdez, M.S., CCC-SLP ?Speech-Language Pathologist ?[place] - [facility] ?[phone] (ASCOM) ? ?Christopher Valdez ? ?05/02/2021, 11:50 AM ?

## 2021-05-03 ENCOUNTER — Telehealth: Payer: Self-pay

## 2021-05-03 NOTE — Telephone Encounter (Signed)
Transition Care Management Unsuccessful Follow-up Telephone Call ? ?Date of discharge and from where:  05/02/21 [facility] ? ?Attempts:  1st Attempt ? ?Reason for unsuccessful TCM follow-up call:  No answer/busy ? ?  ?

## 2021-05-05 LAB — CULTURE, BLOOD (ROUTINE X 2): Culture: NO GROWTH

## 2021-05-05 NOTE — Telephone Encounter (Signed)
Appt sch'd for 5.3.2023

## 2021-05-05 NOTE — Telephone Encounter (Signed)
Pt caregiver [name] is calling back to follow up.  ?Stacha requested a call back, and mentioned that she does not have discharge papers. ? ? ? ? [phone] ?

## 2021-05-05 NOTE — Telephone Encounter (Signed)
Transition Care Management Unsuccessful Follow-up Telephone Call ? ?Date of discharge and from where:  05/02/21 ? ?Attempts:  2nd Attempt ? ?Reason for unsuccessful TCM follow-up call:  Left voice message for pt or home to contact office to schedule hosp fu appt.  ? ?  ?

## 2021-05-12 ENCOUNTER — Ambulatory Visit (INDEPENDENT_AMBULATORY_CARE_PROVIDER_SITE_OTHER): Payer: Medicare Other | Admitting: Nurse Practitioner

## 2021-05-12 ENCOUNTER — Other Ambulatory Visit: Payer: Self-pay

## 2021-05-12 ENCOUNTER — Encounter: Payer: Self-pay | Admitting: Nurse Practitioner

## 2021-05-12 VITALS — BP 120/80 | HR 98 | Temp 97.9°F | Resp 16 | Ht 73.0 in | Wt 167.5 lb

## 2021-05-12 DIAGNOSIS — Z09 Encounter for follow-up examination after completed treatment for conditions other than malignant neoplasm: Secondary | ICD-10-CM

## 2021-05-12 DIAGNOSIS — R6520 Severe sepsis without septic shock: Secondary | ICD-10-CM | POA: Diagnosis not present

## 2021-05-12 DIAGNOSIS — A419 Sepsis, unspecified organism: Secondary | ICD-10-CM | POA: Diagnosis not present

## 2021-05-12 DIAGNOSIS — J690 Pneumonitis due to inhalation of food and vomit: Secondary | ICD-10-CM

## 2021-05-12 DIAGNOSIS — J9601 Acute respiratory failure with hypoxia: Secondary | ICD-10-CM | POA: Diagnosis not present

## 2021-05-12 NOTE — Progress Notes (Signed)
? ?BP 120/80   Pulse 98   Temp 97.9 ?F (36.6 ?C) (Oral)   Resp 16   Ht 6\' 1"  (1.854 m)   Wt 167 lb 8 oz (76 kg)   SpO2 97%   BMI 22.10 kg/m?   ? ?Subjective:  ? ? Patient ID: Christopher AmourJohn J Stangler Jr., male    DOB: 07/31/1960, 61 y.o.   MRN: [id number] ? ?HPI: ?Christopher AmourJohn J Isbell Jr. is a 61 y.o. male ? ?Chief Complaint  ?Patient presents with  ? Hospitalization Follow-up  ? ?Hospital follow-up/aspiration pneumonia/sepsis: He was seen in the emergency department on 04/30/2021.  Discharged on 05/02/2021.  He was sent to the emergency department from his group home after having a choking incident.  EMS attempted to do the Heimlich maneuver but could not get anything removed patient was transferred to the hospital on a nonrebreather satting 90%.  Body was removed by ER physician.Date/Time: 04/30/2021 9:43 PM Performed by: Sharman CheekStafford, Phillip, MD Authorized by: Sharman CheekStafford, Phillip, MD Consent: The procedure was performed in an emergent situation. Body area: throat Complexity: simple 1 objects recovered. Objects recovered: large piece of partially chewed meat Post-procedure assessment: foreign body removed Patient tolerance: patient tolerated the procedure well with no immediate complications Comments: Lung aeration improved after foreign body removal.  Chest x-ray was performed which showed Bilateral airspace disease, left greater than right concerning for pneumonia.  Patient was then admitted to the hospital for pneumonia and sepsis.  Lactic acid was 2.6.  WBC was 12.0.  He was treated with azithromycin, Rocephin, Unasyn and Flagyl.  He was seen by speech therapy and was on a pur?ed diet.  He was prescribed Augmentin at discharge. He says he finished his antibiotic.  Will get CBC and BMP.  He says he is feeling much better, breathing good, and he says he is back to eating his regular diet.  Vital signs are normal.  ? ?Relevant past medical, surgical, family and social history reviewed and updated as indicated. Interim medical history  since our last visit reviewed. ?Allergies and medications reviewed and updated. ? ?Review of Systems ? ?Constitutional: Negative for fever or weight change.  ?Respiratory: Negative for cough and shortness of breath.   ?Cardiovascular: Negative for chest pain or palpitations.  ?Gastrointestinal: Negative for abdominal pain, no bowel changes.  ?Musculoskeletal: Negative for gait problem or joint swelling.  ?Skin: Negative for rash.  ?Neurological: Negative for dizziness or headache.  ?No other specific complaints in a complete review of systems (except as listed in HPI above).  ? ?   ?Objective:  ?  ?BP 120/80   Pulse 98   Temp 97.9 ?F (36.6 ?C) (Oral)   Resp 16   Ht 6\' 1"  (1.854 m)   Wt 167 lb 8 oz (76 kg)   SpO2 97%   BMI 22.10 kg/m?   ?Wt Readings from Last 3 Encounters:  ?05/12/21 167 lb 8 oz (76 kg)  ?04/30/21 171 lb (77.6 kg)  ?04/13/21 171 lb 9.6 oz (77.8 kg)  ?  ?Physical Exam ? ?Constitutional: Patient appears well-developed and well-nourished.  No distress.  ?HEENT: head atraumatic, normocephalic, pupils equal and reactive to light,  neck supple ?Cardiovascular: Normal rate, regular rhythm and normal heart sounds.  No murmur heard. No BLE edema. ?Pulmonary/Chest: Effort normal and breath sounds normal. No respiratory distress. ?Abdominal: Soft.  There is no tenderness. ?Psychiatric: Patient has a normal mood and affect. behavior is normal. Judgment and thought content normal.  ?Results for orders placed or performed  during the hospital encounter of 04/30/21  ?Resp Panel by RT-PCR (Flu A&B, Covid) Nasopharyngeal Swab  ? Specimen: Nasopharyngeal Swab; Nasopharyngeal(NP) swabs in vial transport medium  ?Result Value Ref Range  ? SARS Coronavirus 2 by RT PCR NEGATIVE NEGATIVE  ? Influenza A by PCR NEGATIVE NEGATIVE  ? Influenza B by PCR NEGATIVE NEGATIVE  ?Culture, blood (routine x 2)  ? Specimen: BLOOD  ?Result Value Ref Range  ? Specimen Description BLOOD BLOOD RIGHT FOREARM   ? Special Requests    ?   BOTTLES DRAWN AEROBIC AND ANAEROBIC Blood Culture results may not be optimal due to an excessive volume of blood received in culture bottles  ? Culture    ?  NO GROWTH 5 DAYS ?Performed at [facility], [address], [place], [place] [address] ?  ? Report Status 05/05/2021 FINAL   ?Comprehensive metabolic panel  ?Result Value Ref Range  ? Sodium 139 135 - 145 mmol/L  ? Potassium 3.6 3.5 - 5.1 mmol/L  ? Chloride 104 98 - 111 mmol/L  ? CO2 25 22 - 32 mmol/L  ? Glucose, Bld 139 (H) 70 - 99 mg/dL  ? BUN 20 8 - 23 mg/dL  ? Creatinine, Ser 1.13 0.61 - 1.24 mg/dL  ? Calcium 9.0 8.9 - 10.3 mg/dL  ? Total Protein 6.8 6.5 - 8.1 g/dL  ? Albumin 3.9 3.5 - 5.0 g/dL  ? AST 35 15 - 41 U/L  ? ALT 28 0 - 44 U/L  ? Alkaline Phosphatase 88 38 - 126 U/L  ? Total Bilirubin 0.5 0.3 - 1.2 mg/dL  ? GFR, Estimated >60 >60 mL/min  ? Anion gap 10 5 - 15  ?CBC with Differential  ?Result Value Ref Range  ? WBC 12.0 (H) 4.0 - 10.5 K/uL  ? RBC 4.23 4.22 - 5.81 MIL/uL  ? Hemoglobin 12.6 (L) 13.0 - 17.0 g/dL  ? HCT 40.0 39.0 - 52.0 %  ? MCV 94.6 80.0 - 100.0 fL  ? MCH 29.8 26.0 - 34.0 pg  ? MCHC 31.5 30.0 - 36.0 g/dL  ? RDW 14.0 11.5 - 15.5 %  ? Platelets 248 150 - 400 K/uL  ? nRBC 0.0 0.0 - 0.2 %  ? Neutrophils Relative % 61 %  ? Neutro Abs 7.4 1.7 - 7.7 K/uL  ? Lymphocytes Relative 25 %  ? Lymphs Abs 3.0 0.7 - 4.0 K/uL  ? Monocytes Relative 9 %  ? Monocytes Absolute 1.0 0.1 - 1.0 K/uL  ? Eosinophils Relative 2 %  ? Eosinophils Absolute 0.2 0.0 - 0.5 K/uL  ? Basophils Relative 1 %  ? Basophils Absolute 0.1 0.0 - 0.1 K/uL  ? Immature Granulocytes 2 %  ? Abs Immature Granulocytes 0.24 (H) 0.00 - 0.07 K/uL  ?Lactic acid, plasma  ?Result Value Ref Range  ? Lactic Acid, Venous 2.6 (HH) 0.5 - 1.9 mmol/L  ?Lactic acid, plasma  ?Result Value Ref Range  ? Lactic Acid, Venous 1.9 0.5 - 1.9 mmol/L  ?Procalcitonin  ?Result Value Ref Range  ? Procalcitonin <0.10 ng/mL  ?Comprehensive metabolic panel  ?Result Value Ref Range  ? Sodium 143 135 - 145  mmol/L  ? Potassium 3.8 3.5 - 5.1 mmol/L  ? Chloride 108 98 - 111 mmol/L  ? CO2 28 22 - 32 mmol/L  ? Glucose, Bld 97 70 - 99 mg/dL  ? BUN 21 8 - 23 mg/dL  ? Creatinine, Ser 0.88 0.61 - 1.24 mg/dL  ? Calcium 8.8 (L) 8.9 - 10.3  mg/dL  ? Total Protein 5.9 (L) 6.5 - 8.1 g/dL  ? Albumin 3.4 (L) 3.5 - 5.0 g/dL  ? AST 19 15 - 41 U/L  ? ALT 20 0 - 44 U/L  ? Alkaline Phosphatase 66 38 - 126 U/L  ? Total Bilirubin 0.7 0.3 - 1.2 mg/dL  ? GFR, Estimated >60 >60 mL/min  ? Anion gap 7 5 - 15  ?Magnesium  ?Result Value Ref Range  ? Magnesium 1.7 1.7 - 2.4 mg/dL  ?Phosphorus  ?Result Value Ref Range  ? Phosphorus 2.0 (L) 2.5 - 4.6 mg/dL  ?Glucose, capillary  ?Result Value Ref Range  ? Glucose-Capillary 94 70 - 99 mg/dL  ? ?   ?Assessment & Plan:  ? ?Problem List Items Addressed This Visit   ? ?  ? Respiratory  ? Pneumonia  ? Relevant Orders  ? CBC with Differential/Platelet  ? Basic metabolic panel  ?  ? Other  ? Sepsis (HCC)  ? Relevant Orders  ? CBC with Differential/Platelet  ? Basic metabolic panel  ? ?Other Visit Diagnoses   ? ? Hospital discharge follow-up    -  Primary  ? Patient doing better, will get CBC and BMP  ? Relevant Orders  ? CBC with Differential/Platelet  ? Basic metabolic panel  ? ?  ?  ? ?Follow up plan: ?Return if symptoms worsen or fail to improve. ? ? ? ? ? ?

## 2021-05-13 LAB — CBC WITH DIFFERENTIAL/PLATELET
Absolute Monocytes: 480 cells/uL (ref 200–950)
Basophils Absolute: 64 cells/uL (ref 0–200)
Basophils Relative: 0.8 %
Eosinophils Absolute: 160 cells/uL (ref 15–500)
Eosinophils Relative: 2 %
HCT: 36.5 % — ABNORMAL LOW (ref 38.5–50.0)
Hemoglobin: 11.8 g/dL — ABNORMAL LOW (ref 13.2–17.1)
Lymphs Abs: 1208 cells/uL (ref 850–3900)
MCH: 29.6 pg (ref 27.0–33.0)
MCHC: 32.3 g/dL (ref 32.0–36.0)
MCV: 91.7 fL (ref 80.0–100.0)
MPV: 10.5 fL (ref 7.5–12.5)
Monocytes Relative: 6 %
Neutro Abs: 6088 cells/uL (ref 1500–7800)
Neutrophils Relative %: 76.1 %
Platelets: 353 10*3/uL (ref 140–400)
RBC: 3.98 10*6/uL — ABNORMAL LOW (ref 4.20–5.80)
RDW: 12.9 % (ref 11.0–15.0)
Total Lymphocyte: 15.1 %
WBC: 8 10*3/uL (ref 3.8–10.8)

## 2021-05-13 LAB — BASIC METABOLIC PANEL
BUN: 7 mg/dL (ref 7–25)
CO2: 28 mmol/L (ref 20–32)
Calcium: 9.2 mg/dL (ref 8.6–10.3)
Chloride: 103 mmol/L (ref 98–110)
Creat: 0.9 mg/dL (ref 0.70–1.35)
Glucose, Bld: 164 mg/dL — ABNORMAL HIGH (ref 65–99)
Potassium: 3.7 mmol/L (ref 3.5–5.3)
Sodium: 142 mmol/L (ref 135–146)

## 2021-05-18 ENCOUNTER — Other Ambulatory Visit: Payer: Self-pay | Admitting: Family Medicine

## 2021-05-18 DIAGNOSIS — K219 Gastro-esophageal reflux disease without esophagitis: Secondary | ICD-10-CM

## 2021-06-21 IMAGING — CT CT RENAL STONE PROTOCOL
2 of 4 series · 15 of 46 positions shown, 17 images · non-contrast
Comparison: 06/21/2007.

CLINICAL DATA: Right flank pain, intermittent for 6 months. History
of kidney stones.

EXAM:
CT ABDOMEN AND PELVIS WITHOUT CONTRAST
TECHNIQUE: Multidetector CT imaging of the abdomen and pelvis was performed
following the standard protocol without IV contrast.

[Series 2: renal stone 5.00 · axial · 0.67mm/px · z∈[-1559,-1104]mm · 12 of 101 slices shown, 14 images]
[im 5/101  soft-tissue]
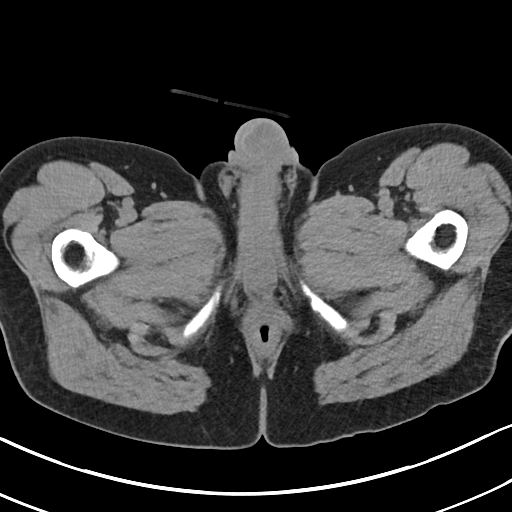
[im 5/101  bone]
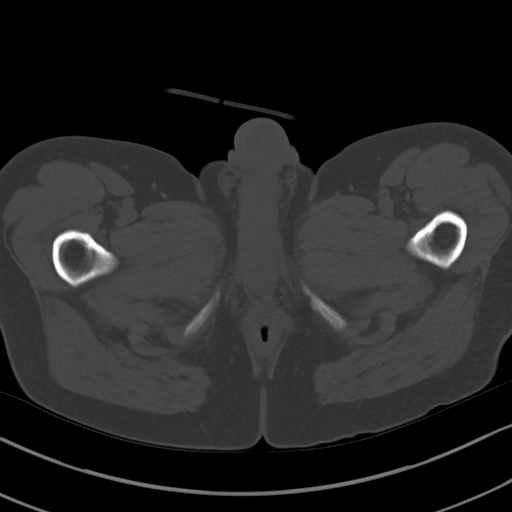
[im 13/101  soft-tissue]
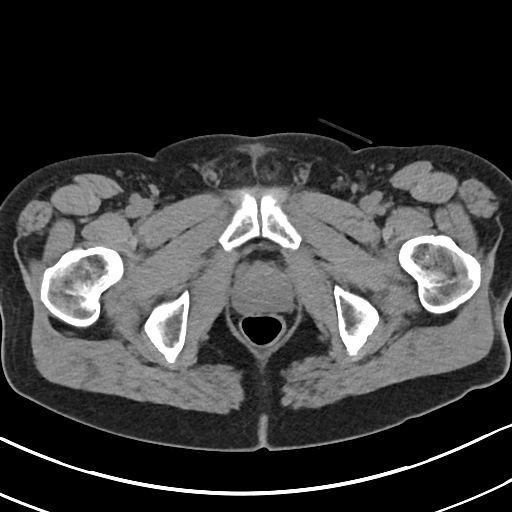
[im 21/101  soft-tissue]
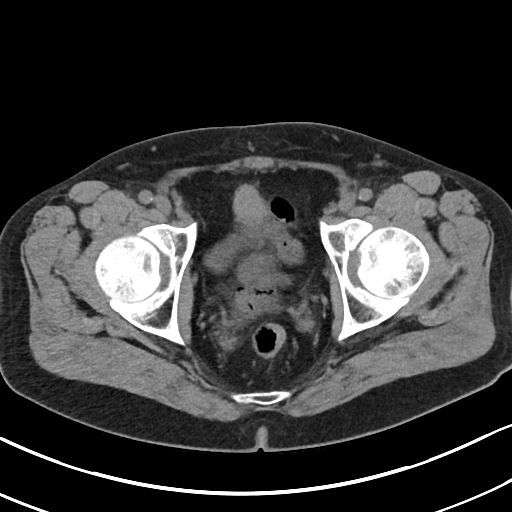
[im 30/101  soft-tissue]
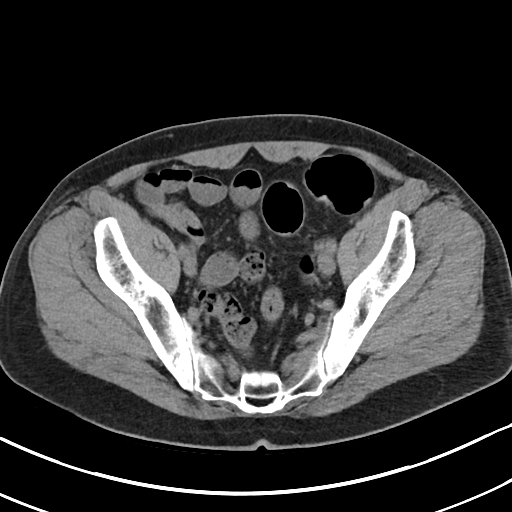
[im 38/101  soft-tissue]
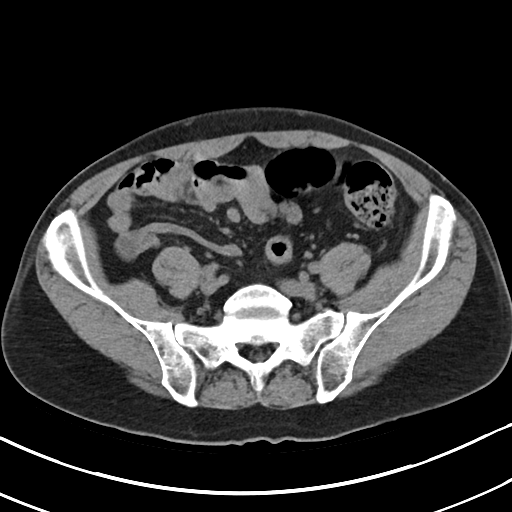
[im 46/101  soft-tissue]
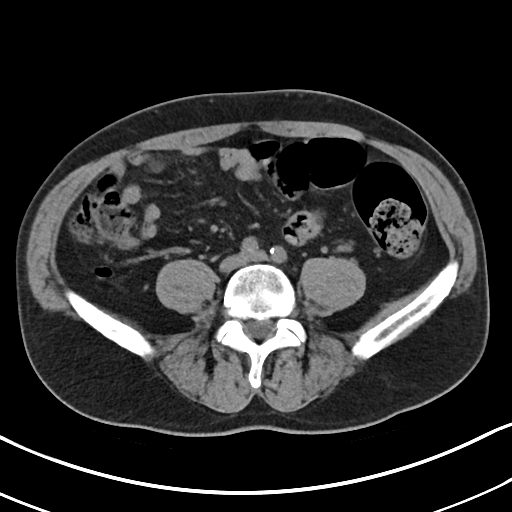
[im 55/101  soft-tissue]
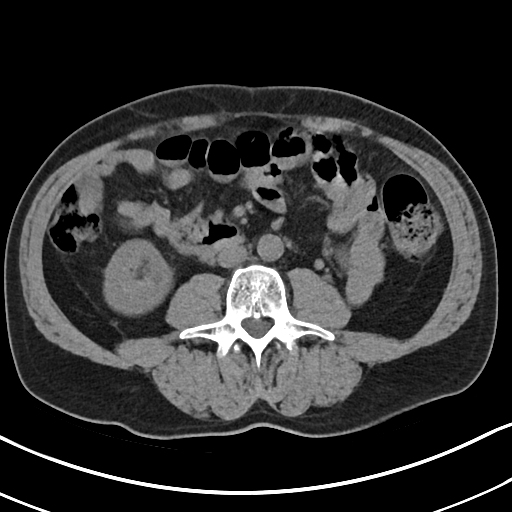
[im 63/101  soft-tissue]
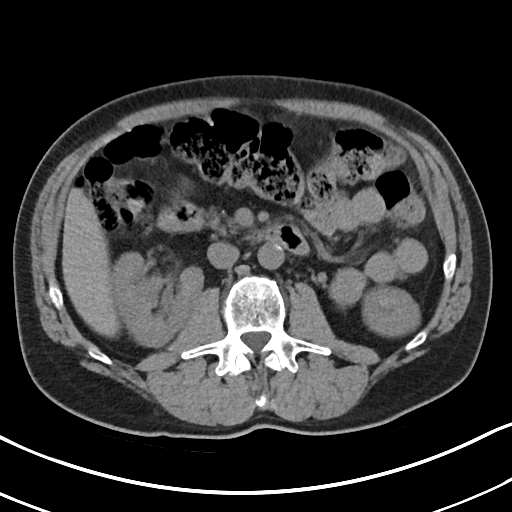
[im 71/101  soft-tissue]
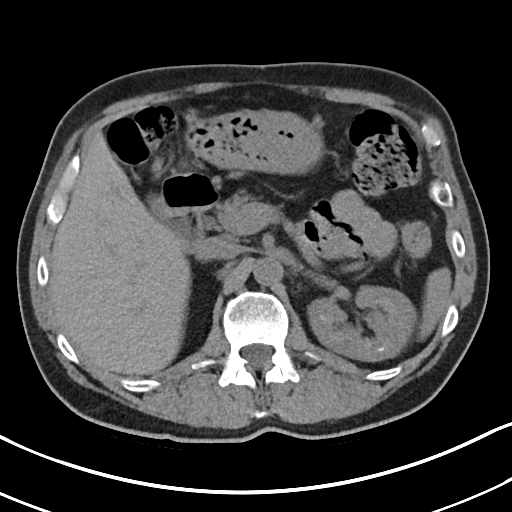
[im 71/101  bone]
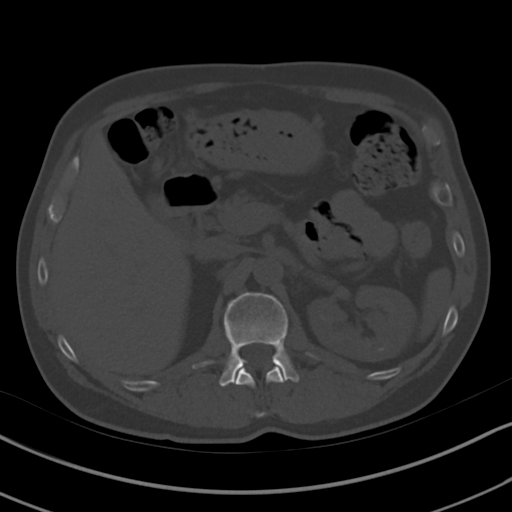
[im 80/101  soft-tissue]
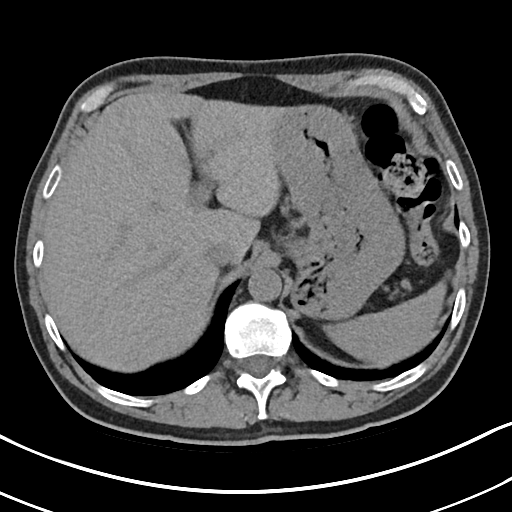
[im 88/101  soft-tissue]
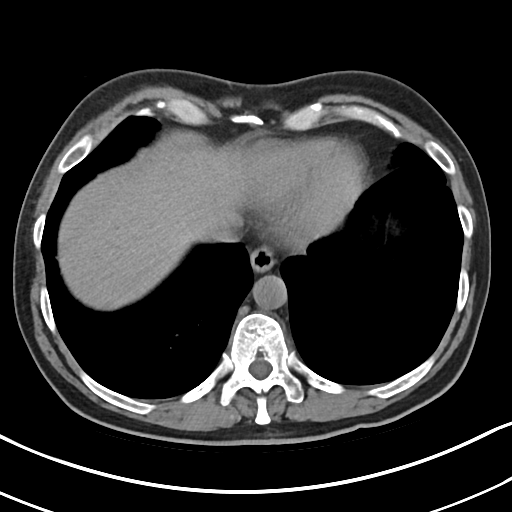
[im 96/101  soft-tissue]
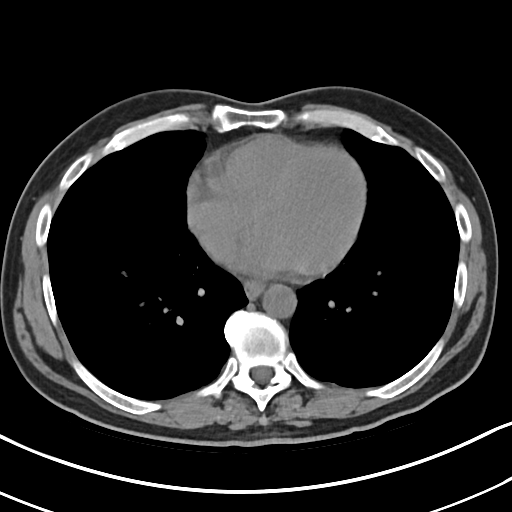

[Series 4: renal stone 2.00 cor · coronal · 0.67mm/px · 3 of 135 slices shown]
[im 45/135  soft-tissue]
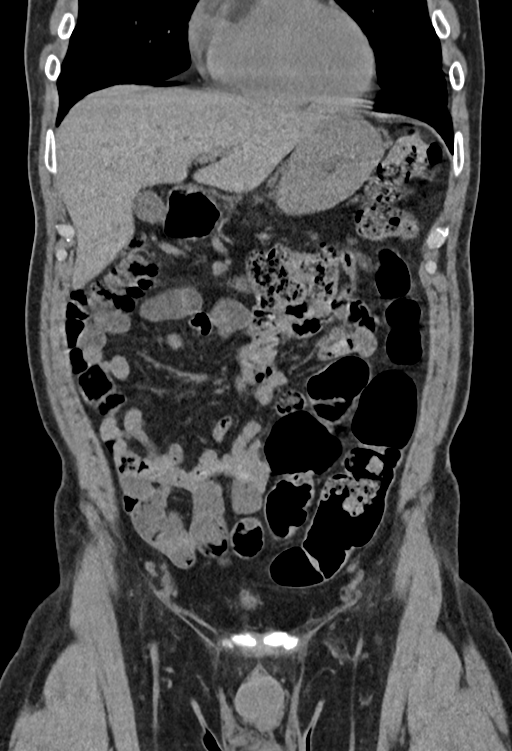
[im 60/135  soft-tissue]
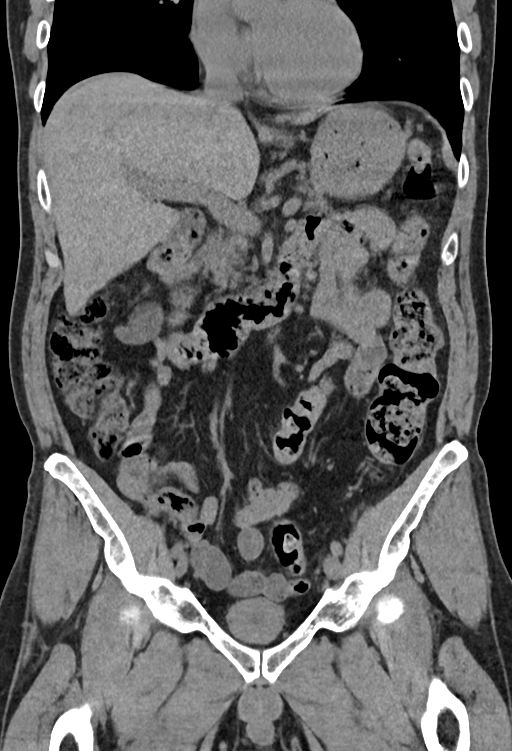
[im 75/135  soft-tissue]
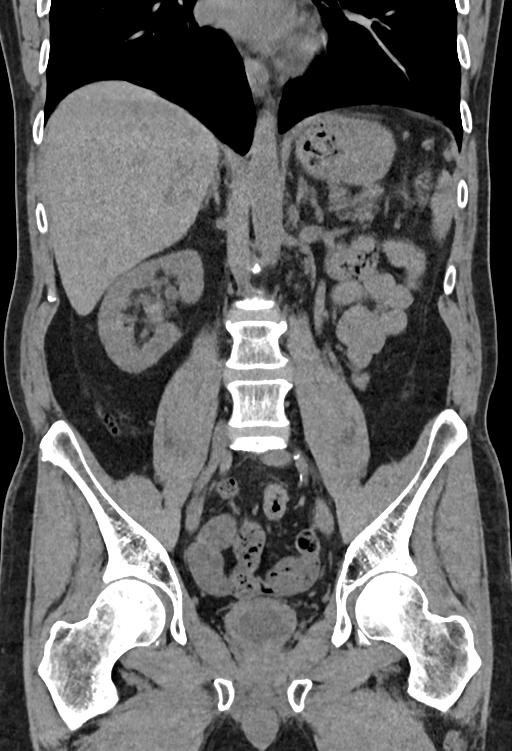

[15 of 46 positions shown; findings below may reference images not displayed]

FINDINGS: Lower chest: Small lower lobe nodules measure up to 2 mm in the
peripheral left lower lobe ([DATE]), not imaged previously. No pleural
fluid. Heart size normal. No pericardial or pleural effusion. Distal
esophagus is unremarkable.

Hepatobiliary: Liver and gallbladder are unremarkable. No biliary
ductal dilatation.

Pancreas: Negative.

Spleen: Negative.

Adrenals/Urinary Tract: A stone in the right renal pelvis measures
1.6 cm with associated mild dilatation of the right renal pelvis and
intrarenal collecting system. Right ureter is decompressed. Mildly
hyperattenuating medullary pyramids bilaterally. Small stones in the
left kidney. Left ureter is decompressed. Significant bladder wall
thickening, out of proportion for the degree of under distension.

Stomach/Bowel: Gastric wall thickening, as on 06/21/2007. Small
bowel, appendix and colon are unremarkable.

Vascular/Lymphatic: Atherosclerotic calcification of the aorta
without aneurysm. No pathologically enlarged lymph nodes.

Reproductive: Prostate is mildly enlarged.

Other: No free fluid. Mesenteries and peritoneum are unremarkable.
Small umbilical hernia contains fat.

Musculoskeletal: Fluffy sclerosis in the medial left iliac wing is
unchanged and benign. Degenerative changes in the spine are mild. No
worrisome lytic or sclerotic lesions.
IMPRESSION: 1. 1.6 cm right renal pelvis stone with associated dilatation of the
right renal pelvis and intrarenal collecting system. This is a call
report.
2. Left renal stones and medullary nephrocalcinosis.
3. Significant bladder wall thickening, out of proportion to the
degree of underdistention.
4. Chronic gastric wall thickening.
5. Enlarged prostate.
6.  Aortic atherosclerosis ([id number]).

## 2021-07-16 ENCOUNTER — Other Ambulatory Visit: Payer: Self-pay | Admitting: Family Medicine

## 2021-07-16 NOTE — Telephone Encounter (Signed)
Christopher Valdez,staff member at patients group home states patients phycologist did not refill patient's cloZAPine (CLOZARIL) 100 MG tablet and the office is closed  Patient will take his last tablet on Sunday 7-9, staff members are concerned of patient's mental well being if he does not take the medication  Caller is requesting a cb w/ provider's advice  Please advise

## 2021-07-16 NOTE — Telephone Encounter (Signed)
Requested medication (s) are due for refill today: Yes  Requested medication (s) are on the active medication list: Yes  Last refill:  2016 by another provider who is not refilling medication.   Future visit scheduled: Yes  Notes to clinic:  Unable to refill per protocol, cannot delegate. Patient at group home and Caryl Pina called to say the patient will be out of the medication on Sunday and that the prior provider will not refill.     Requested Prescriptions  Pending Prescriptions Disp Refills   cloZAPine (CLOZARIL) 100 MG tablet      Sig: Take 2 tablets (200 mg total) by mouth 2 (two) times daily.     Not Delegated - Psychiatry:  Antipsychotics - Second Generation (Atypical) - clozapine Failed - 07/16/2021  4:57 PM      Failed - This refill cannot be delegated      Failed - ANC in normal range and within 180 days    No results found for: "ANCMANDIFF"       Failed - Lipid Panel in normal range within the last 12 months    Cholesterol, Total  Date Value Ref Range Status  10/20/2015 178 100 - 199 mg/dL Final   Cholesterol  Date Value Ref Range Status  09/30/2020 169 <200 mg/dL Final   LDL Cholesterol (Calc)  Date Value Ref Range Status  09/30/2020 99 mg/dL (calc) Final    Comment:    Reference range: <100 . Desirable range <100 mg/dL for primary prevention;   <70 mg/dL for patients with CHD or diabetic patients  with > or = 2 CHD risk factors. Marland Kitchen LDL-C is now calculated using the Martin-Hopkins  calculation, which is a validated novel method providing  better accuracy than the Friedewald equation in the  estimation of LDL-C.  Cresenciano Genre et al. Annamaria Helling. 8786;767(20): 2061-2068  (http://education.[employer].com/faq/FAQ164)    HDL  Date Value Ref Range Status  09/30/2020 47 > OR = 40 mg/dL Final  10/20/2015 42 >39 mg/dL Final   Triglycerides  Date Value Ref Range Status  09/30/2020 133 <150 mg/dL Final         Passed - Completed PHQ-2 or PHQ-9 in the last 360  days      Passed - Last BP in normal range    BP Readings from Last 1 Encounters:  05/12/21 120/80         Passed - Last Heart Rate in normal range    Pulse Readings from Last 1 Encounters:  05/12/21 98         Passed - Valid encounter within last 6 months    Recent Outpatient Visits           2 months ago Hospital discharge follow-up   [place], FNP   5 months ago Smokers' cough [facility])   [facility] Steele Sizer, MD   9 months ago Smokers' cough [facility])   [facility] Steele Sizer, MD   1 year ago Essential hypertension   [facility] Steele Sizer, MD   1 year ago Other schizoaffective disorders [facility])   [facility] Steele Sizer, MD       Future Appointments             In 2 weeks Steele Sizer, MD [place] within normal limits and completed in the last 12  months    WBC  Date Value Ref Range Status  05/12/2021 8.0 3.8 - 10.8 Thousand/uL Final   RBC  Date Value Ref Range Status  05/12/2021 3.98 (L) 4.20 - 5.80 Million/uL Final   Hemoglobin  Date Value Ref Range Status  05/12/2021 11.8 (L) 13.2 - 17.1 g/dL Final  69/61/3482 77.9 12.6 - 17.7 g/dL Final   HCT  Date Value Ref Range Status  05/12/2021 36.5 (L) 38.5 - 50.0 % Final   Hematocrit  Date Value Ref Range Status  10/20/2015 37.7 37.5 - 51.0 % Final   MCHC  Date Value Ref Range Status  05/12/2021 32.3 32.0 - 36.0 g/dL Final   [facility] [place]  Date Value Ref Range Status  05/12/2021 29.6 27.0 - 33.0 pg Final   MCV  Date Value Ref Range Status  05/12/2021 91.7 80.0 - 100.0 fL Final  10/20/2015 90 79 - 97 fL Final   No results found for: "PLTCOUNTKUC", "LABPLAT", "POCPLA" RDW  Date Value Ref Range Status  05/12/2021 12.9 11.0 - 15.0 % Final  10/20/2015 13.7 12.3 - 15.4 % Final         Passed - CMP within normal limits and  completed in the last 12 months    Albumin  Date Value Ref Range Status  05/02/2021 3.4 (L) 3.5 - 5.0 g/dL Final  85/92/1054 4.7 3.5 - 5.5 g/dL Final   Alkaline Phosphatase  Date Value Ref Range Status  05/02/2021 66 38 - 126 U/L Final   Alkaline phosphatase (APISO)  Date Value Ref Range Status  09/30/2020 90 35 - 144 U/L Final   ALT  Date Value Ref Range Status  05/02/2021 20 0 - 44 U/L Final   AST  Date Value Ref Range Status  05/02/2021 19 15 - 41 U/L Final   BUN  Date Value Ref Range Status  05/12/2021 7 7 - 25 mg/dL Final  32/04/5215 23 6 - 24 mg/dL Final   Calcium  Date Value Ref Range Status  05/12/2021 9.2 8.6 - 10.3 mg/dL Final   CO2  Date Value Ref Range Status  05/12/2021 28 20 - 32 mmol/L Final   Creat  Date Value Ref Range Status  05/12/2021 0.90 0.70 - 1.35 mg/dL Final   Creatinine, Urine  Date Value Ref Range Status  09/30/2020 181 20 - 320 mg/dL Final   Glucose, Bld  Date Value Ref Range Status  05/12/2021 164 (H) 65 - 99 mg/dL Final    Comment:    .            Fasting reference interval . For someone without known diabetes, a glucose value >125 mg/dL indicates that they may have diabetes and this should be confirmed with a follow-up test. .    Glucose-Capillary  Date Value Ref Range Status  05/02/2021 94 70 - 99 mg/dL Final    Comment:    Glucose reference range applies only to samples taken after fasting for at least 8 hours.   Potassium  Date Value Ref Range Status  05/12/2021 3.7 3.5 - 5.3 mmol/L Final   Sodium  Date Value Ref Range Status  05/12/2021 142 135 - 146 mmol/L Final  10/20/2015 138 134 - 144 mmol/L Final   Total Bilirubin  Date Value Ref Range Status  05/02/2021 0.7 0.3 - 1.2 mg/dL Final   Bilirubin Total  Date Value Ref Range Status  10/20/2015 <0.2 0.0 - 1.2 mg/dL Final   Protein, ur  Date Value Ref Range Status  05/07/2019 NEGATIVE NEGATIVE mg/dL Final   Protein,UA  Date Value Ref Range Status   05/16/2019 3+ (A) Negative/Trace Final   Total Protein  Date Value Ref Range Status  05/02/2021 5.9 (L) 6.5 - 8.1 g/dL Final  10/20/2015 6.8 6.0 - 8.5 g/dL Final   GFR, Est African American  Date Value Ref Range Status  10/01/2019 97 > OR = 60 mL/min/1.99m2 Final   eGFR  Date Value Ref Range Status  09/30/2020 81 > OR = 60 mL/min/1.82m2 Final    Comment:    The eGFR is based on the CKD-EPI 2021 equation. To calculate  the new eGFR from a previous Creatinine or Cystatin C result, go to https://www.kidney.org/professionals/ kdoqi/gfr%5Fcalculator    GFR, Est Non African American  Date Value Ref Range Status  10/01/2019 83 > OR = 60 mL/min/1.61m2 Final   GFR, Estimated  Date Value Ref Range Status  05/02/2021 >60 >60 mL/min Final    Comment:    (NOTE) Calculated using the CKD-EPI Creatinine Equation (2021)

## 2021-07-20 ENCOUNTER — Emergency Department: Payer: Medicare Other

## 2021-07-20 ENCOUNTER — Encounter: Payer: Self-pay | Admitting: Emergency Medicine

## 2021-07-20 ENCOUNTER — Telehealth: Payer: Self-pay | Admitting: Family Medicine

## 2021-07-20 ENCOUNTER — Inpatient Hospital Stay
Admission: EM | Admit: 2021-07-20 | Discharge: 2021-07-22 | DRG: 191 | Disposition: A | Discharge status: Home / Self Care | Payer: Medicare Other | Attending: Internal Medicine | Admitting: Internal Medicine

## 2021-07-20 DIAGNOSIS — J690 Pneumonitis due to inhalation of food and vomit: Secondary | ICD-10-CM | POA: Diagnosis present

## 2021-07-20 DIAGNOSIS — Z20822 Contact with and (suspected) exposure to covid-19: Secondary | ICD-10-CM | POA: Diagnosis present

## 2021-07-20 DIAGNOSIS — J439 Emphysema, unspecified: Secondary | ICD-10-CM | POA: Diagnosis not present

## 2021-07-20 DIAGNOSIS — R7989 Other specified abnormal findings of blood chemistry: Secondary | ICD-10-CM | POA: Diagnosis present

## 2021-07-20 DIAGNOSIS — I10 Essential (primary) hypertension: Secondary | ICD-10-CM | POA: Diagnosis present

## 2021-07-20 DIAGNOSIS — D649 Anemia, unspecified: Secondary | ICD-10-CM | POA: Diagnosis present

## 2021-07-20 DIAGNOSIS — J449 Chronic obstructive pulmonary disease, unspecified: Secondary | ICD-10-CM | POA: Diagnosis present

## 2021-07-20 DIAGNOSIS — A419 Sepsis, unspecified organism: Secondary | ICD-10-CM | POA: Diagnosis present

## 2021-07-20 DIAGNOSIS — E785 Hyperlipidemia, unspecified: Secondary | ICD-10-CM | POA: Diagnosis present

## 2021-07-20 DIAGNOSIS — R0602 Shortness of breath: Secondary | ICD-10-CM

## 2021-07-20 DIAGNOSIS — K219 Gastro-esophageal reflux disease without esophagitis: Secondary | ICD-10-CM | POA: Diagnosis present

## 2021-07-20 DIAGNOSIS — N1330 Unspecified hydronephrosis: Secondary | ICD-10-CM | POA: Diagnosis present

## 2021-07-20 DIAGNOSIS — J441 Chronic obstructive pulmonary disease with (acute) exacerbation: Secondary | ICD-10-CM | POA: Diagnosis present

## 2021-07-20 DIAGNOSIS — Z8701 Personal history of pneumonia (recurrent): Secondary | ICD-10-CM | POA: Diagnosis present

## 2021-07-20 DIAGNOSIS — F419 Anxiety disorder, unspecified: Secondary | ICD-10-CM | POA: Diagnosis present

## 2021-07-20 DIAGNOSIS — F17210 Nicotine dependence, cigarettes, uncomplicated: Secondary | ICD-10-CM | POA: Diagnosis present

## 2021-07-20 DIAGNOSIS — Z79899 Other long term (current) drug therapy: Secondary | ICD-10-CM

## 2021-07-20 DIAGNOSIS — R778 Other specified abnormalities of plasma proteins: Secondary | ICD-10-CM | POA: Diagnosis present

## 2021-07-20 DIAGNOSIS — F32A Depression, unspecified: Secondary | ICD-10-CM | POA: Diagnosis present

## 2021-07-20 DIAGNOSIS — Z720 Tobacco use: Secondary | ICD-10-CM | POA: Diagnosis present

## 2021-07-20 DIAGNOSIS — F259 Schizoaffective disorder, unspecified: Secondary | ICD-10-CM | POA: Diagnosis present

## 2021-07-20 DIAGNOSIS — R1310 Dysphagia, unspecified: Secondary | ICD-10-CM | POA: Diagnosis present

## 2021-07-20 DIAGNOSIS — E876 Hypokalemia: Secondary | ICD-10-CM | POA: Diagnosis present

## 2021-07-20 DIAGNOSIS — Z91048 Other nonmedicinal substance allergy status: Secondary | ICD-10-CM

## 2021-07-20 DIAGNOSIS — F258 Other schizoaffective disorders: Secondary | ICD-10-CM

## 2021-07-20 LAB — CBC WITH DIFFERENTIAL/PLATELET
Abs Immature Granulocytes: 0.03 10*3/uL (ref 0.00–0.07)
Basophils Absolute: 0 10*3/uL (ref 0.0–0.1)
Basophils Relative: 0 %
Eosinophils Absolute: 0 10*3/uL (ref 0.0–0.5)
Eosinophils Relative: 0 %
HCT: 37.7 % — ABNORMAL LOW (ref 39.0–52.0)
Hemoglobin: 12.3 g/dL — ABNORMAL LOW (ref 13.0–17.0)
Immature Granulocytes: 0 %
Lymphocytes Relative: 8 %
Lymphs Abs: 0.8 10*3/uL (ref 0.7–4.0)
MCH: 28.2 pg (ref 26.0–34.0)
MCHC: 32.6 g/dL (ref 30.0–36.0)
MCV: 86.5 fL (ref 80.0–100.0)
Monocytes Absolute: 0.6 10*3/uL (ref 0.1–1.0)
Monocytes Relative: 6 %
Neutro Abs: 8.7 10*3/uL — ABNORMAL HIGH (ref 1.7–7.7)
Neutrophils Relative %: 86 %
Platelets: 264 10*3/uL (ref 150–400)
RBC: 4.36 MIL/uL (ref 4.22–5.81)
RDW: 14.6 % (ref 11.5–15.5)
WBC: 10.2 10*3/uL (ref 4.0–10.5)
nRBC: 0 % (ref 0.0–0.2)

## 2021-07-20 LAB — URINALYSIS, ROUTINE W REFLEX MICROSCOPIC
Bacteria, UA: NONE SEEN
Bilirubin Urine: NEGATIVE
Glucose, UA: NEGATIVE mg/dL
Ketones, ur: 5 mg/dL — AB
Leukocytes,Ua: NEGATIVE
Nitrite: NEGATIVE
Protein, ur: NEGATIVE mg/dL
Specific Gravity, Urine: 1.003 — ABNORMAL LOW (ref 1.005–1.030)
Squamous Epithelial / HPF: NONE SEEN (ref 0–5)
pH: 7 (ref 5.0–8.0)

## 2021-07-20 LAB — COMPREHENSIVE METABOLIC PANEL
ALT: 22 U/L (ref 0–44)
AST: 35 U/L (ref 15–41)
Albumin: 4.3 g/dL (ref 3.5–5.0)
Alkaline Phosphatase: 106 U/L (ref 38–126)
Anion gap: 13 (ref 5–15)
BUN: 7 mg/dL — ABNORMAL LOW (ref 8–23)
CO2: 24 mmol/L (ref 22–32)
Calcium: 9.5 mg/dL (ref 8.9–10.3)
Chloride: 97 mmol/L — ABNORMAL LOW (ref 98–111)
Creatinine, Ser: 1.09 mg/dL (ref 0.61–1.24)
GFR, Estimated: >60 mL/min (ref >60)
Glucose, Bld: 115 mg/dL — ABNORMAL HIGH (ref 70–99)
Potassium: 3.1 mmol/L — ABNORMAL LOW (ref 3.5–5.1)
Sodium: 134 mmol/L — ABNORMAL LOW (ref 135–145)
Total Bilirubin: 0.8 mg/dL (ref 0.3–1.2)
Total Protein: 7.3 g/dL (ref 6.5–8.1)

## 2021-07-20 LAB — BRAIN NATRIURETIC PEPTIDE: B Natriuretic Peptide: 11.1 pg/mL (ref 0.0–100.0)

## 2021-07-20 LAB — TROPONIN I (HIGH SENSITIVITY): Troponin I (High Sensitivity): 23 ng/L — ABNORMAL HIGH (ref <18)

## 2021-07-20 LAB — VALPROIC ACID LEVEL: Valproic Acid Lvl: 35 ug/mL — ABNORMAL LOW (ref 50.0–100.0)

## 2021-07-20 LAB — SARS CORONAVIRUS 2 BY RT PCR: SARS Coronavirus 2 by RT PCR: NEGATIVE

## 2021-07-20 MED ORDER — IOHEXOL 350 MG/ML SOLN
75.0000 mL | Freq: Once | INTRAVENOUS | Status: AC | PRN
  Administered 2021-07-20: 75 mL via INTRAVENOUS

## 2021-07-20 MED ORDER — SODIUM CHLORIDE 0.9 % IV BOLUS
500.0000 mL | Freq: Once | INTRAVENOUS | Status: AC
  Administered 2021-07-20: 500 mL via INTRAVENOUS

## 2021-07-20 NOTE — ED Provider Notes (Signed)
[facility] Provider Note    Event Date/Time   First MD Initiated Contact with Patient 07/20/21 2121     (approximate)   History   Shortness of Breath   HPI  Christopher Valdez. is a 61 y.o. male  hyperlipidemia, depression, anxiety, schizoaffective disorder , GERD who comes in for shortness of breath.  Patient comes in with [facility] for shortness of breath over the past 3 weeks but getting worse over the past couple days.  Patient has significant history of aspiration.  He denies any known aspiration event but states that sometimes he tries to drink water and he cannot chokes on it.  He denies any falls, hitting his head, abdominal pain, leg pain, leg swelling.  I then reviewed the records and on 4/21 patient was admitted for aspiration pneumonia  Physical Exam   Triage Vital Signs: Blood pressure (!) 147/81, pulse (!) 103, temperature 98.8 F (37.1 C), temperature source Rectal, resp. rate 20, height 6' (1.829 m), weight 72.4 kg, SpO2 95 %.  Most recent vital signs: Vitals:   07/20/21 2127 07/20/21 2149  BP: (!) 147/81   Pulse: (!) 103   Resp: 20   Temp: 98.2 F (36.8 C) 98.8 F (37.1 C)  SpO2: 95%      General: Awake, no distress.  CV:  Good peripheral perfusion.  Resp:  Normal effort.  Abd:  No distention.  Other:  Tachycardic, increased respiratory rate but no wheezing noted.  No swelling in his legs.  No calf tenderness.   ED Results / Procedures / Treatments   Labs (all labs ordered are listed, but only abnormal results are displayed) Labs Reviewed  CBC WITH DIFFERENTIAL/PLATELET - Abnormal; Notable for the following components:      Result Value   Hemoglobin 12.3 (*)    HCT 37.7 (*)    Neutro Abs 8.7 (*)    All other components within normal limits  BLOOD GAS, VENOUS - Abnormal; Notable for the following components:   pH, Ven 7.47 (*)    pCO2, Ven 39 (*)    pO2, Ven 52 (*)    Bicarbonate 28.4 (*)    Acid-Base  Excess 4.5 (*)    All other components within normal limits  CULTURE, BLOOD (ROUTINE X 2)  CULTURE, BLOOD (ROUTINE X 2)  SARS CORONAVIRUS 2 BY RT PCR  COMPREHENSIVE METABOLIC PANEL  BRAIN NATRIURETIC PEPTIDE  PROCALCITONIN  PROCALCITONIN  LACTIC ACID, PLASMA  LACTIC ACID, PLASMA  VALPROIC ACID LEVEL  TROPONIN I (HIGH SENSITIVITY)     EKG  My interpretation of EKG:  Sinus tachycardia rate of 102 without any ST elevation or T wave inversions, normal intervals  RADIOLOGY I have reviewed the xray personally and interpreted no any evidence of pneumonia   PROCEDURES:  Critical Care performed: No  .1-3 Lead EKG Interpretation  Performed by: Concha Se, MD Authorized by: Concha Se, MD     Interpretation: abnormal     ECG rate:  110   ECG rate assessment: tachycardic     Rhythm: sinus tachycardia     Ectopy: none     Conduction: normal      MEDICATIONS ORDERED IN ED: Medications - No data to display   IMPRESSION / MDM / ASSESSMENT AND PLAN / ED COURSE  I reviewed the triage vital signs and the nursing notes.   Patient's presentation is most consistent with acute presentation with potential threat to life or bodily function.  Patient comes in tachycardic with an increased respiratory rate.  I concerned about the possibility of aspiration pneumonia.  X-ray was ordered without any evidence of aspiration.  CBC reassuring with normal white count.  CMP overall reassuring.  Troponin slightly elevated.  BNP normal.  Rectal temperature was normal.  Patient was given a little bit of fluids.  Will get CT PE.  I got a call from radiology that it looks like his stomach was kind distended and they asked if they wanted me to extend down to the abdomen therefore we will add a CT abdomen.  Patient will be handed off to oncoming team pending CT results and reevaluation for potential admission versus discharge.   The patient is on the cardiac monitor to evaluate for evidence of  arrhythmia and/or significant heart rate changes.      FINAL CLINICAL IMPRESSION(S) / ED DIAGNOSES   Final diagnoses:  Shortness of breath     Rx / DC Orders   ED Discharge Orders     None        Note:  This document was prepared using Dragon voice recognition software and may include unintentional dictation errors.   Concha Se, MD 07/20/21 [phone]

## 2021-07-20 NOTE — ED Provider Notes (Signed)
-----------------------------------------   11:05 PM on 07/20/2021 -----------------------------------------  Blood pressure (!) 167/92, pulse (!) 118, temperature 98.8 F (37.1 C), temperature source Rectal, resp. rate (!) 27, height 6' (1.829 m), weight 72.4 kg, SpO2 96 %.  Assuming care from Dr. Fuller Plan.  In short, Christopher Valdez. is a 61 y.o. male with a chief complaint of Shortness of Breath .  Refer to the original H&P for additional details.  The current plan of care is to follow-up CTA chest, reassess for SOB and likely admission.  ----------------------------------------- 12:45 AM on 07/21/2021 ----------------------------------------- CTA chest is negative for PE or focal infiltrate, does show signs of emphysema with mucus noted in the trachea.  On reassessment, patient continues to be tachycardic and tachypneic, with distended stomach and mucus in the trachea I am concerned for possible aspiration that may not have blossomed yet on imaging.  Vital signs and clinical appearance are concerning for sepsis and we will cover with broad-spectrum antibiotics, continue IV fluid hydration.  We will also treat with steroids and DuoNeb given ongoing tachypnea with signs of emphysema on imaging.  Case discussed with hospitalist for admission.    Chesley Noon, MD 07/21/21 [phone]

## 2021-07-20 NOTE — ED Notes (Signed)
Pt to CT

## 2021-07-20 NOTE — Telephone Encounter (Unsigned)
Copied from CRM [id number]. Topic: Referral - Status >> Jul 20, 2021  2:44 PM Franchot Heidelberg wrote: Reason for CRM: The patient's caregiver called requesting a new Psychiatry referral because the current provider is unavailable or no longer practicing she is unsure. She says the patient is in need of his Clozaril and has been showing abnormal behavior since he has been off of it

## 2021-07-20 NOTE — ED Notes (Signed)
Pt back from CT

## 2021-07-20 NOTE — ED Triage Notes (Signed)
Pt presents via EMS from [facility]. He notes having SOB for the last 3 weeks that got worse today. He was seen here 3 weeks ago for aspiration. Pt was placed on 2L [place] for comfort. Pt notes being out of his medications but is unsure what meds that includes. Pt takes Depakote, Prozac, Clozapine, Prilosec, Ingrezza, and Simvastatin. Denies CP.

## 2021-07-21 ENCOUNTER — Other Ambulatory Visit: Payer: Self-pay

## 2021-07-21 DIAGNOSIS — Z8701 Personal history of pneumonia (recurrent): Secondary | ICD-10-CM | POA: Diagnosis not present

## 2021-07-21 DIAGNOSIS — Z79899 Other long term (current) drug therapy: Secondary | ICD-10-CM | POA: Diagnosis not present

## 2021-07-21 DIAGNOSIS — Z20822 Contact with and (suspected) exposure to covid-19: Secondary | ICD-10-CM | POA: Diagnosis present

## 2021-07-21 DIAGNOSIS — I10 Essential (primary) hypertension: Secondary | ICD-10-CM | POA: Diagnosis present

## 2021-07-21 DIAGNOSIS — F419 Anxiety disorder, unspecified: Secondary | ICD-10-CM | POA: Diagnosis present

## 2021-07-21 DIAGNOSIS — R0602 Shortness of breath: Secondary | ICD-10-CM | POA: Diagnosis present

## 2021-07-21 DIAGNOSIS — D649 Anemia, unspecified: Secondary | ICD-10-CM | POA: Diagnosis present

## 2021-07-21 DIAGNOSIS — R778 Other specified abnormalities of plasma proteins: Secondary | ICD-10-CM | POA: Diagnosis present

## 2021-07-21 DIAGNOSIS — K219 Gastro-esophageal reflux disease without esophagitis: Secondary | ICD-10-CM | POA: Diagnosis present

## 2021-07-21 DIAGNOSIS — J441 Chronic obstructive pulmonary disease with (acute) exacerbation: Secondary | ICD-10-CM

## 2021-07-21 DIAGNOSIS — J690 Pneumonitis due to inhalation of food and vomit: Secondary | ICD-10-CM

## 2021-07-21 DIAGNOSIS — E876 Hypokalemia: Secondary | ICD-10-CM | POA: Diagnosis present

## 2021-07-21 DIAGNOSIS — E785 Hyperlipidemia, unspecified: Secondary | ICD-10-CM | POA: Diagnosis present

## 2021-07-21 DIAGNOSIS — R1310 Dysphagia, unspecified: Secondary | ICD-10-CM | POA: Diagnosis present

## 2021-07-21 DIAGNOSIS — F17210 Nicotine dependence, cigarettes, uncomplicated: Secondary | ICD-10-CM | POA: Diagnosis present

## 2021-07-21 DIAGNOSIS — J449 Chronic obstructive pulmonary disease, unspecified: Secondary | ICD-10-CM | POA: Diagnosis present

## 2021-07-21 DIAGNOSIS — J439 Emphysema, unspecified: Secondary | ICD-10-CM | POA: Diagnosis present

## 2021-07-21 DIAGNOSIS — F32A Depression, unspecified: Secondary | ICD-10-CM | POA: Diagnosis present

## 2021-07-21 DIAGNOSIS — F259 Schizoaffective disorder, unspecified: Secondary | ICD-10-CM | POA: Diagnosis present

## 2021-07-21 DIAGNOSIS — Z91048 Other nonmedicinal substance allergy status: Secondary | ICD-10-CM | POA: Diagnosis not present

## 2021-07-21 DIAGNOSIS — N1330 Unspecified hydronephrosis: Secondary | ICD-10-CM | POA: Diagnosis present

## 2021-07-21 LAB — CBC
HCT: 36.2 % — ABNORMAL LOW (ref 39.0–52.0)
HCT: 39.3 % (ref 39.0–52.0)
Hemoglobin: 11.8 g/dL — ABNORMAL LOW (ref 13.0–17.0)
Hemoglobin: 12.7 g/dL — ABNORMAL LOW (ref 13.0–17.0)
MCH: 28.7 pg (ref 26.0–34.0)
MCH: 28.6 pg (ref 26.0–34.0)
MCHC: 32.6 g/dL (ref 30.0–36.0)
MCHC: 32.3 g/dL (ref 30.0–36.0)
MCV: 88.1 fL (ref 80.0–100.0)
MCV: 88.5 fL (ref 80.0–100.0)
Platelets: 240 10*3/uL (ref 150–400)
Platelets: 252 10*3/uL (ref 150–400)
RBC: 4.11 MIL/uL — ABNORMAL LOW (ref 4.22–5.81)
RBC: 4.44 MIL/uL (ref 4.22–5.81)
RDW: 14.6 % (ref 11.5–15.5)
RDW: 14.6 % (ref 11.5–15.5)
WBC: 9.5 10*3/uL (ref 4.0–10.5)
WBC: 7.6 10*3/uL (ref 4.0–10.5)
nRBC: 0 % (ref 0.0–0.2)
nRBC: 0 % (ref 0.0–0.2)

## 2021-07-21 LAB — CREATININE, SERUM
Creatinine, Ser: 1.01 mg/dL (ref 0.61–1.24)
GFR, Estimated: >60 mL/min (ref >60)

## 2021-07-21 LAB — COMPREHENSIVE METABOLIC PANEL
ALT: 19 U/L (ref 0–44)
AST: 38 U/L (ref 15–41)
Albumin: 4 g/dL (ref 3.5–5.0)
Alkaline Phosphatase: 101 U/L (ref 38–126)
Anion gap: 7 (ref 5–15)
BUN: 8 mg/dL (ref 8–23)
CO2: 26 mmol/L (ref 22–32)
Calcium: 9.4 mg/dL (ref 8.9–10.3)
Chloride: 107 mmol/L (ref 98–111)
Creatinine, Ser: 0.97 mg/dL (ref 0.61–1.24)
GFR, Estimated: >60 mL/min (ref >60)
Glucose, Bld: 111 mg/dL — ABNORMAL HIGH (ref 70–99)
Potassium: 3.9 mmol/L (ref 3.5–5.1)
Sodium: 140 mmol/L (ref 135–145)
Total Bilirubin: 1 mg/dL (ref 0.3–1.2)
Total Protein: 6.9 g/dL (ref 6.5–8.1)

## 2021-07-21 LAB — LACTIC ACID, PLASMA
Lactic Acid, Venous: 0.9 mmol/L (ref 0.5–1.9)
Lactic Acid, Venous: 1 mmol/L (ref 0.5–1.9)

## 2021-07-21 LAB — PROCALCITONIN
Procalcitonin: <0.1 ng/mL
Procalcitonin: <0.1 ng/mL

## 2021-07-21 LAB — TROPONIN I (HIGH SENSITIVITY)
Troponin I (High Sensitivity): 35 ng/L — ABNORMAL HIGH (ref <18)
Troponin I (High Sensitivity): 35 ng/L — ABNORMAL HIGH (ref <18)

## 2021-07-21 MED ORDER — LACTATED RINGERS IV BOLUS
1000.0000 mL | Freq: Once | INTRAVENOUS | Status: AC
  Administered 2021-07-21: 1000 mL via INTRAVENOUS

## 2021-07-21 MED ORDER — IPRATROPIUM-ALBUTEROL 0.5-2.5 (3) MG/3ML IN SOLN: 3.0000 mL | RESPIRATORY_TRACT | Status: DC | PRN

## 2021-07-21 MED ORDER — PANTOPRAZOLE SODIUM 40 MG IV SOLR: 40.0000 mg | INTRAVENOUS | Status: DC

## 2021-07-21 MED ORDER — ALBUTEROL SULFATE (2.5 MG/3ML) 0.083% IN NEBU: 2.5000 mg | INHALATION_SOLUTION | Freq: Four times a day (QID) | RESPIRATORY_TRACT | Status: DC | PRN

## 2021-07-21 MED ORDER — IPRATROPIUM-ALBUTEROL 0.5-2.5 (3) MG/3ML IN SOLN
3.0000 mL | Freq: Once | RESPIRATORY_TRACT | Status: AC
  Administered 2021-07-21: 3 mL via RESPIRATORY_TRACT
  Filled 2021-07-21: qty 3

## 2021-07-21 MED ORDER — PIPERACILLIN-TAZOBACTAM 3.375 G IVPB 30 MIN
3.3750 g | Freq: Once | INTRAVENOUS | Status: AC
  Administered 2021-07-21: 3.375 g via INTRAVENOUS
  Filled 2021-07-21: qty 50

## 2021-07-21 MED ORDER — DIVALPROEX SODIUM ER 500 MG PO TB24
500.0000 mg | ORAL_TABLET | Freq: Every day | ORAL | Status: DC
  Administered 2021-07-21: 500 mg via ORAL
  Filled 2021-07-21: qty 1

## 2021-07-21 MED ORDER — SODIUM CHLORIDE 0.9 % IV SOLN
500.0000 mg | Freq: Once | INTRAVENOUS | Status: AC
  Administered 2021-07-21: 500 mg via INTRAVENOUS
  Filled 2021-07-21: qty 5

## 2021-07-21 MED ORDER — PANTOPRAZOLE SODIUM 40 MG IV SOLR
40.0000 mg | Freq: Two times a day (BID) | INTRAVENOUS | Status: DC
  Administered 2021-07-21 (×2): 40 mg via INTRAVENOUS
  Filled 2021-07-21 (×2): qty 10

## 2021-07-21 MED ORDER — MELATONIN 5 MG PO TABS
5.0000 mg | ORAL_TABLET | Freq: Once | ORAL | Status: AC
  Administered 2021-07-22: 5 mg via ORAL
  Filled 2021-07-21 (×2): qty 1

## 2021-07-21 MED ORDER — ACETAMINOPHEN 325 MG PO TABS
650.0000 mg | ORAL_TABLET | Freq: Four times a day (QID) | ORAL | Status: DC | PRN
  Administered 2021-07-21: 650 mg via ORAL
  Filled 2021-07-21: qty 2

## 2021-07-21 MED ORDER — ACETAMINOPHEN 650 MG RE SUPP: 650.0000 mg | Freq: Four times a day (QID) | RECTAL | Status: DC | PRN

## 2021-07-21 MED ORDER — FLUOXETINE HCL 20 MG PO CAPS
40.0000 mg | ORAL_CAPSULE | Freq: Every day | ORAL | Status: DC
  Administered 2021-07-21 – 2021-07-22 (×2): 40 mg via ORAL
  Filled 2021-07-21 (×2): qty 2

## 2021-07-21 MED ORDER — VALBENAZINE TOSYLATE 40 MG PO CAPS
80.0000 mg | ORAL_CAPSULE | Freq: Every day | ORAL | Status: DC
  Administered 2021-07-21 – 2021-07-22 (×2): 80 mg via ORAL
  Filled 2021-07-21 (×2): qty 2

## 2021-07-21 MED ORDER — CLOZAPINE 100 MG PO TABS
200.0000 mg | ORAL_TABLET | Freq: Two times a day (BID) | ORAL | Status: DC
  Administered 2021-07-21 – 2021-07-22 (×3): 200 mg via ORAL
  Filled 2021-07-21 (×3): qty 2

## 2021-07-21 MED ORDER — METHYLPREDNISOLONE SODIUM SUCC 125 MG IJ SOLR
125.0000 mg | Freq: Once | INTRAMUSCULAR | Status: AC
  Administered 2021-07-21: 125 mg via INTRAVENOUS
  Filled 2021-07-21: qty 2

## 2021-07-21 MED ORDER — ALBUTEROL SULFATE HFA 108 (90 BASE) MCG/ACT IN AERS: 2.0000 | INHALATION_SPRAY | Freq: Four times a day (QID) | RESPIRATORY_TRACT | Status: DC | PRN

## 2021-07-21 MED ORDER — HEPARIN SODIUM (PORCINE) 5000 UNIT/ML IJ SOLN
5000.0000 [IU] | Freq: Two times a day (BID) | INTRAMUSCULAR | Status: DC
Start: 2021-07-21 — End: 2021-07-22
  Administered 2021-07-21 – 2021-07-22 (×4): 5000 [IU] via SUBCUTANEOUS
  Filled 2021-07-21 (×3): qty 1

## 2021-07-21 MED ORDER — SIMVASTATIN 20 MG PO TABS
20.0000 mg | ORAL_TABLET | Freq: Every day | ORAL | Status: DC
  Administered 2021-07-21: 20 mg via ORAL
  Filled 2021-07-21: qty 1

## 2021-07-21 MED ORDER — SODIUM CHLORIDE 0.9 % IV SOLN: INTRAVENOUS | Status: DC

## 2021-07-21 MED ORDER — SODIUM CHLORIDE 0.9% FLUSH
3.0000 mL | Freq: Two times a day (BID) | INTRAVENOUS | Status: DC
Start: 2021-07-21 — End: 2021-07-22
  Administered 2021-07-21 – 2021-07-22 (×3): 3 mL via INTRAVENOUS

## 2021-07-21 MED ORDER — METHYLPREDNISOLONE SODIUM SUCC 125 MG IJ SOLR: 60.0000 mg | Freq: Every day | INTRAMUSCULAR | Status: DC

## 2021-07-21 MED ORDER — HYDRALAZINE HCL 20 MG/ML IJ SOLN
10.0000 mg | INTRAMUSCULAR | Status: DC | PRN
  Administered 2021-07-21: 10 mg via INTRAVENOUS
  Filled 2021-07-21: qty 1

## 2021-07-21 NOTE — Assessment & Plan Note (Signed)
Initial lactic is normal and procalcitonin is pending. Suspect pt has aspiration pneumonitis.  We will continue iv abx and follow c/s.

## 2021-07-21 NOTE — Assessment & Plan Note (Signed)
-  Nicotine patch 

## 2021-07-21 NOTE — Assessment & Plan Note (Signed)
Strict I/o. Foley as needed if pt starts retaining and not able to empty bladder.

## 2021-07-21 NOTE — Assessment & Plan Note (Signed)
Replete and recheck 

## 2021-07-21 NOTE — H&P (Signed)
History and Physical    Christopher AmourJohn J Mariscal Jr. ZOX:[id number] DOB: 06/23/1960 DOA: 07/20/2021  PCP: Alba CorySowles, Krichna, MD    Patient coming from:   [facility]    Chief Complaint:  SOB   HPI:  Christopher AmourJohn J Cormany Jr. is a 61 y.o. male seen in ed with complaints of SOB that started 3 weeks ago and has been progressively getting worse.  HPI is limited due to pt's psychiatric disorder.  Patient does not report any headaches blurred vision speech or gait issues coughing fevers chills aspiration events.  Patient does not report any chest pain shortness of breath abdominal pain nausea vomiting diarrhea. Chart review shows patient has a history of aspiration pneumonia and has had a swallow study, patient also has a history of anemia and elevated blood pressures which we will monitor and treat this time.  Pt has past medical history of past medical history of aspiration pneumonia, kidney stones, GERD, schizoaffective schizophrenia, GERD proteinuria.  ED Course:   Vitals:   07/20/21 2300 07/20/21 2328 07/20/21 2330 07/21/21 0100  BP: (!) 167/92  (!) 171/96 135/78  Pulse: (!) 118 (!) 103 (!) 107 91  Resp: (!) 27 17 20 19   Temp:      TempSrc:      SpO2: 96% 98% 97% 95%  Weight:      Height:      In ed pt is sleeping and meets sepsis criteria.  Labs show normal cbc, bmp shows mild hypokalemia normal electrolytes and Kidney function.  Review of Systems:  Review of Systems  Unable to perform ROS: Other (sleeping)  Respiratory:  Positive for cough.     Past Medical History:  Diagnosis Date   Calculus of kidney    Chronic constipation    Dyslipidemia    GERD (gastroesophageal reflux disease)    Proteinuria    Schizo-affective schizophrenia (HCC)    Vitamin D deficiency     Past Surgical History:  Procedure Laterality Date   COLONOSCOPY  07/02/2012   CYSTOSCOPY W/ RETROGRADES Right 05/10/2019   Procedure: CYSTOSCOPY WITH RETROGRADE PYELOGRAM;  Surgeon: Sondra ComeSninsky, Brian C, MD;   Location: [facility] ORS;  Service: Urology;  Laterality: Right;   CYSTOSCOPY/URETEROSCOPY/HOLMIUM LASER/STENT PLACEMENT Right 05/10/2019   Procedure: CYSTOSCOPY/URETEROSCOPY/HOLMIUM LASER/STENT PLACEMENT;  Surgeon: Sondra ComeSninsky, Brian C, MD;  Location: [facility] ORS;  Service: Urology;  Laterality: Right;     reports that he has been smoking cigarettes. He has a 40.00 pack-year smoking history. He has never used smokeless tobacco. He reports that he does not drink alcohol and does not use drugs.  Allergies  Allergen Reactions   Tall Ragweed Other (See Comments)    Pollen    Family History  Problem Relation Age of Onset   Parkinson's disease Mother    Deep vein thrombosis Father    Osteoarthritis Father     Prior to Admission medications   Medication Sig Start Date End Date Taking? Authorizing Provider  acetaminophen (TYLENOL) 500 MG tablet Take 1,000 mg by mouth every 4 (four) hours as needed for mild pain or headache.     [provider]  albuterol (VENTOLIN HFA) 108 (90 Base) MCG/ACT inhaler Inhale 2 puffs into the lungs every 6 (six) hours as needed for wheezing or shortness of breath. 05/02/21   Amin, Loura HaltAnkit Chirag, MD  Cholecalciferol (VITAMIN D3) 50 MCG (2000 UT) capsule TAKE 1 CAPSULE BY MOUTH DAILY 07/15/20   Alba CorySowles, Krichna, MD  cloZAPine (CLOZARIL) 100 MG tablet Take 200 mg by mouth  2 (two) times daily.     Katheren Puller, MD  divalproex (DEPAKOTE ER) 500 MG 24 hr tablet Take 500 mg by mouth at bedtime.  04/16/19   [provider]  FLUoxetine (PROZAC) 40 MG capsule Take 40 mg by mouth daily.    [provider]  INGREZZA 80 MG capsule Take 80 mg by mouth daily. 01/26/21   [provider]  omeprazole (PRILOSEC) 40 MG capsule TAKE 1 CAPSULE BY MOUTH DAILY 05/18/21   Alba Cory, MD  polycarbophil (FIBERCON) 625 MG tablet Take 625 mg by mouth daily.    [provider]  simvastatin (ZOCOR) 20 MG tablet TAKE 1 TABLET BY MOUTH DAILY 03/02/21   Alba Cory, MD    Physical Exam: Vitals:   07/20/21 2300 07/20/21 2328 07/20/21 2330 07/21/21 0100  BP: (!) 167/92  (!) 171/96 135/78  Pulse: (!) 118 (!) 103 (!) 107 91  Resp: (!) Temp:      TempSrc:      SpO2: 96% 98% 97% 95%  Weight:      Height:       Physical Exam Vitals and nursing note reviewed.  Constitutional:      General: He is not in acute distress.    Appearance: Normal appearance. He is not ill-appearing, toxic-appearing or diaphoretic.  HENT:     Head: Normocephalic and atraumatic.     Right Ear: Hearing and external ear normal.     Left Ear: Hearing and external ear normal.     Nose: Nose normal. No nasal deformity.     Mouth/Throat:     Lips: Pink.     Mouth: Mucous membranes are moist.     Tongue: No lesions.     Pharynx: Oropharynx is clear.  Eyes:     Pupils: Pupils are equal, round, and reactive to light.  Neck:     Vascular: No carotid bruit.  Cardiovascular:     Rate and Rhythm: Normal rate and regular rhythm.     Pulses: Normal pulses.     Heart sounds: Normal heart sounds.  Pulmonary:     Effort: Pulmonary effort is normal.     Breath sounds: Examination of the right-lower field reveals wheezing. Examination of the left-lower field reveals wheezing. Wheezing present.  Abdominal:     General: Bowel sounds are normal. There is no distension.     Palpations: Abdomen is soft. There is no mass.     Tenderness: There is no abdominal tenderness. There is no guarding.     Hernia: No hernia is present.  Musculoskeletal:     Right lower leg: No edema.     Left lower leg: No edema.  Skin:    General: Skin is warm.  Neurological:     General: No focal deficit present.     Cranial Nerves: Cranial nerves 2-12 are intact.     Motor: Motor function is intact.     Comments: Sleeping      Labs on Admission: I have personally reviewed following labs and imaging studies BMET Recent Labs  Lab 07/20/21 2135  NA 134*  K 3.1*  CL 97*  CO2  24  BUN 7*  CREATININE 1.09  GLUCOSE 115*   Electrolytes Recent Labs  Lab 07/20/21 2135  CALCIUM 9.5   Sepsis Markers Recent Labs  Lab 07/20/21 2135 07/20/21 2330  LATICACIDVEN  --  0.9  PROCALCITON <0.10  --    ABG No results for  input(s): "PHART", "PCO2ART", "PO2ART" in the last 168 hours. Liver Enzymes Recent Labs  Lab 07/20/21 2135  AST 35  ALT 22  ALKPHOS 106  BILITOT 0.8  ALBUMIN 4.3   Cardiac Enzymes No results for input(s): "TROPONINI", "PROBNP" in the last 168 hours. No results found for: "DDIMER" Coag's No results for input(s): "APTT", "INR" in the last 168 hours.  Recent Results (from the past 240 hour(s))  SARS Coronavirus 2 by RT PCR (hospital order, performed in [facility] hospital lab) *cepheid single result test* Anterior Nasal Swab     Status: None   Collection Time: 07/20/21  9:35 PM   Specimen: Anterior Nasal Swab  Result Value Ref Range Status   SARS Coronavirus 2 by RT PCR NEGATIVE NEGATIVE Final    Comment: (NOTE) SARS-CoV-2 target nucleic acids are NOT DETECTED.  The SARS-CoV-2 RNA is generally detectable in upper and lower respiratory specimens during the acute phase of infection. The lowest concentration of SARS-CoV-2 viral copies this assay can detect is 250 copies / mL. A negative result does not preclude SARS-CoV-2 infection and should not be used as the sole basis for treatment or other patient management decisions.  A negative result may occur with improper specimen collection / handling, submission of specimen other than nasopharyngeal swab, presence of viral mutation(s) within the areas targeted by this assay, and inadequate number of viral copies (<250 copies / mL). A negative result must be combined with clinical observations, patient history, and epidemiological information.  Fact Sheet for Patients:   RoadLapTop.co.za  Fact Sheet for Healthcare  Providers: http://kim-miller.com/  This test is not yet approved or  cleared by the [place] FDA and has been authorized for detection and/or diagnosis of SARS-CoV-2 by FDA under an Emergency Use Authorization (EUA).  This EUA will remain in effect (meaning this test can be used) for the duration of the COVID-19 declaration under Section 564(b)(1) of the Act, 21 U.S.C. section 360bbb-3(b)(1), unless the authorization is terminated or revoked sooner.  Performed at [facility], [address] [place], [place] [address]      Current Facility-Administered Medications:    0.9 %  sodium chloride infusion, , Intravenous, Continuous, Allena Katz, Eliezer Mccoy, MD   acetaminophen (TYLENOL) tablet 650 mg, 650 mg, Oral, Q6H PRN **OR** acetaminophen (TYLENOL) suppository 650 mg, 650 mg, Rectal, Q6H PRN, Gertha Calkin, MD   albuterol (PROVENTIL) (2.5 MG/3ML) 0.083% nebulizer solution 2.5 mg, 2.5 mg, Nebulization, Q6H PRN, Otelia Sergeant, RPH   azithromycin (ZITHROMAX) 500 mg in sodium chloride 0.9 % 250 mL IVPB, 500 mg, Intravenous, Once, Chesley Noon, MD, Last Rate: 250 mL/hr at 07/21/21 0200, 500 mg at 07/21/21 0200   cloZAPine (CLOZARIL) tablet 200 mg, 200 mg, Oral, BID, Irena Cords V, MD   divalproex (DEPAKOTE ER) 24 hr tablet 500 mg, 500 mg, Oral, QHS, Rosely Fernandez V, MD   FLUoxetine (PROZAC) capsule 40 mg, 40 mg, Oral, Daily, Naoma Boxell V, MD   heparin injection 5,000 Units, 5,000 Units, Subcutaneous, Q12H, Allena Katz, Ieshia Hatcher V, MD   hydrALAZINE (APRESOLINE) injection 10 mg, 10 mg, Intravenous, Q4H PRN, Irena Cords V, MD   pantoprazole (PROTONIX) injection 40 mg, 40 mg, Intravenous, Q12H, Irena Cords V, MD, 40 mg at 07/21/21 0130   simvastatin (ZOCOR) tablet 20 mg, 20 mg, Oral, Daily, Chaniqua Brisby V, MD   sodium chloride flush (NS) 0.9 % injection 3 mL, 3 mL, Intravenous, Q12H, Irena Cords V, MD, 3 mL at 07/21/21 0200   valbenazine (  INGREZZA) capsule 80 mg, 80 mg, Oral,  Daily, Gertha Calkin, MD  Current Outpatient Medications:    acetaminophen (TYLENOL) 500 MG tablet, Take 1,000 mg by mouth every 4 (four) hours as needed for mild pain or headache. , Disp: , Rfl:    albuterol (VENTOLIN HFA) 108 (90 Base) MCG/ACT inhaler, Inhale 2 puffs into the lungs every 6 (six) hours as needed for wheezing or shortness of breath., Disp: 8 g, Rfl: 2   Cholecalciferol (VITAMIN D3) 50 MCG (2000 UT) capsule, TAKE 1 CAPSULE BY MOUTH DAILY, Disp: 30 capsule, Rfl: 12   cloZAPine (CLOZARIL) 100 MG tablet, Take 200 mg by mouth 2 (two) times daily. , Disp: , Rfl:    divalproex (DEPAKOTE ER) 500 MG 24 hr tablet, Take 500 mg by mouth at bedtime. , Disp: , Rfl:    FLUoxetine (PROZAC) 40 MG capsule, Take 40 mg by mouth daily., Disp: , Rfl:    INGREZZA 80 MG capsule, Take 80 mg by mouth daily., Disp: , Rfl:    omeprazole (PRILOSEC) 40 MG capsule, TAKE 1 CAPSULE BY MOUTH DAILY, Disp: 30 capsule, Rfl: 5   polycarbophil (FIBERCON) 625 MG tablet, Take 625 mg by mouth daily., Disp: , Rfl:    simvastatin (ZOCOR) 20 MG tablet, TAKE 1 TABLET BY MOUTH DAILY, Disp: 30 tablet, Rfl: 5  COVID-19 Labs No results for input(s): "DDIMER", "FERRITIN", "LDH", "CRP" in the last 72 hours. Lab Results  Component Value Date   SARSCOV2NAA NEGATIVE 07/20/2021   SARSCOV2NAA NEGATIVE 04/30/2021   SARSCOV2NAA NEGATIVE 05/10/2019    Radiological Exams on Admission: CT ABDOMEN PELVIS W CONTRAST  Result Date: 07/20/2021 CLINICAL DATA:  Acute abdominal pain. EXAM: CT ABDOMEN AND PELVIS WITH CONTRAST TECHNIQUE: Multidetector CT imaging of the abdomen and pelvis was performed using the standard protocol following bolus administration of intravenous contrast. RADIATION DOSE REDUCTION: This exam was performed according to the departmental dose-optimization program which includes automated exposure control, adjustment of the mA and/or kV according to patient size and/or use of iterative reconstruction technique. CONTRAST:   59mL OMNIPAQUE IOHEXOL 350 MG/ML SOLN COMPARISON:  CT 05/01/2019 FINDINGS: Lower chest: Assessed on concurrent chest CT, reported separately. Hepatobiliary: No focal liver abnormality is seen. No gallstones, gallbladder wall thickening, or biliary dilatation. Pancreas: Parenchymal atrophy. No ductal dilatation or inflammation. Spleen: Normal in size without focal abnormality. Adrenals/Urinary Tract: No adrenal nodule. There is mild right hydronephrosis and proximal hydroureter, however no ureteral stone or cause for obstruction. There is symmetric excretion on delayed phase imaging. Punctate nonobstructing intrarenal stones on the left. Tiny bilateral renal cortical hypodensities may represent small cysts or sequela of lithium therapy. No suspicious renal lesion. Small bladder distension, no wall thickening. Stomach/Bowel: Detailed bowel assessment is limited in the absence of enteric contrast and patient motion. There is diffuse gastric wall thickening. Fluid-filled mildly prominent loops of small bowel in the upper abdomen, but no discrete transition point or evidence of obstruction. No associated wall thickening. Small bowel projects anterior to the stomach and transverse colon. Normal appendix. No colonic inflammation. Vascular/Lymphatic: Calcified noncalcified atheromatous plaque in the abdominal aorta. No aneurysm. Patent portal vein. No bulky abdominopelvic adenopathy. Reproductive: Prostate is unremarkable. Other: No free air or ascites.  No abdominal wall hernia. Musculoskeletal: Hemi transitional lumbosacral anatomy. Stable left iliac sclerotic focus likely bone island. There are no acute or suspicious osseous abnormalities. IMPRESSION: 1. Fluid-filled mildly prominent loops of small bowel in the upper abdomen. Small bowel is displaced anterior to the stomach and transverse  colon which may represent internal hernia, no evidence of inflammation, bowel ischemia or obstruction. 2. Mild right hydronephrosis  and proximal hydroureter, however no ureteral stone or cause for obstruction. Recommend correlation with urinalysis. 3. Punctate nonobstructing left nephrolithiasis. 4. Chronic gastric wall thickening. Aortic Atherosclerosis (ICD10-I70.0). Electronically Signed   By: Narda Rutherford M.D.   On: 07/20/2021 23:46   CT Angio Chest PE W and/or Wo Contrast  Result Date: 07/20/2021 CLINICAL DATA:  Pulmonary embolism (PE) suspected, high prob Shortness of breath. EXAM: CT ANGIOGRAPHY CHEST WITH CONTRAST TECHNIQUE: Multidetector CT imaging of the chest was performed using the standard protocol during bolus administration of intravenous contrast. Multiplanar CT image reconstructions and MIPs were obtained to evaluate the vascular anatomy. RADIATION DOSE REDUCTION: This exam was performed according to the departmental dose-optimization program which includes automated exposure control, adjustment of the mA and/or kV according to patient size and/or use of iterative reconstruction technique. CONTRAST:  82mL OMNIPAQUE IOHEXOL 350 MG/ML SOLN COMPARISON:  Radiograph earlier today. FINDINGS: Cardiovascular: There are no filling defects within the pulmonary arteries to suggest pulmonary embolus. The thoracic aorta is normal in caliber. No acute aortic findings. Conventional branching pattern from the aortic arch. Heart is normal in size. No pericardial effusion. Mediastinum/Nodes: Shotty mediastinal and hilar lymph nodes, not enlarged by size criteria. Tiny hiatal hernia. No thyroid nodule. Lungs/Pleura: Mild emphysema. Central bronchial thickening. Mild dependent atelectasis in both lungs. Retained mucus within the trachea. 4 mm subpleural nodule in the left lower lobe series 7, image 83. Upper Abdomen: Assessed on concurrent abdominal CT, reported separately. Musculoskeletal: Left anterior third through seventh rib fractures with callus formation. Fractures of right anterior six through eighth ribs have callus formation. No  acute osseous findings. Left gynecomastia Review of the MIP images confirms the above findings. IMPRESSION: 1. No pulmonary embolus. 2. Emphysema bronchial thickening.  Retained mucus in the trachea. 3. Bilateral anterior rib fractures with callus formation, suggesting this is subacute or remote. 4. Left lower lobe 4 mm subpleural nodule. No routine follow-up imaging is recommended per Fleischner Society Guidelines. These guidelines do not apply to immunocompromised patients and patients with cancer. Follow up in patients with significant comorbidities as clinically warranted. For lung cancer screening, adhere to Lung-RADS guidelines. Reference: Radiology. 2017; 284(1):228-43. Emphysema (ICD10-J43.9). Electronically Signed   By: Narda Rutherford M.D.   On: 07/20/2021 23:38   DG Chest Portable 1 View  Result Date: 07/20/2021 CLINICAL DATA:  Shortness of breath EXAM: PORTABLE CHEST 1 VIEW COMPARISON:  04/30/2021 FINDINGS: No acute consolidation or effusion. Probable mild scarring at the left lung base. Normal cardiac size. No pneumothorax. IMPRESSION: Suspect mild scarring at the left lung base. Electronically Signed   By: Jasmine Pang M.D.   On: 07/20/2021 22:15    EKG: Independently reviewed.  Sinus tachycardia with heart rate of 102 normal axis, left atrial enlargement, QTc of 484.   Assessment and Plan: * Aspiration pneumonia (HCC) Pt had similar admission in April and speech eval then showed :  Dysphagia 1 (Puree);Dysphagia 2 (Fine chop)    Liquid Administration via: Straw Medication Administration: Crushed with puree Supervision: Staff to assist with self feeding;Full supervision/cueing for compensatory strategies Compensations: Minimize environmental distractions;Small sips/bites Postural Changes: Seated upright at 90 degrees;Remain upright for at least 30 minutes after po intake      We will do a bedside swallow and keep pt npo . Pt needs GI eval with UGI eval for any strictures or  esophageal disorders or web as pt  also has anemia that has been going on for past few cbc.     Latest Ref Rng & Units 07/20/2021    9:35 PM 05/12/2021    1:48 PM 04/30/2021    7:17 PM  CBC  WBC 4.0 - 10.5 K/uL 10.2  8.0  12.0   Hemoglobin 13.0 - 17.0 g/dL 24.2  68.3  41.9   Hematocrit 39.0 - 52.0 % 37.7  36.5  40.0   Platelets 150 - 400 K/uL 264  353  248     Will put in GI consult.    Elevated troponin TNI 23 and 35.  We will continue to follow. suspect secondary to sepsis like picture.    Hypokalemia Replace and follow levels.  Anemia Patient has been anemic since 2019. We will request GI consult for evaluation of any GI sources of anemia in addition to evaluation of patient's recurrent aspiration swallowing difficulty or any structural symmetrical issues like esophageal webs or strictures peptic ulcer disease.  Anemia panel. Plan IV PPI therapy until then.  Sepsis (HCC) Initial lactic is normal and procalcitonin is pending. Suspect pt has aspiration pneumonitis.  We will continue iv abx and follow c/s.    Essential hypertension Blood pressure (!) 171/96, pulse (!) 107, temperature 98.8 F (37.1 C), temperature source Rectal, resp. rate 20, height 6' (1.829 m), weight 72.4 kg, SpO2 97 %. PRN hydralazine. No blood pressure meds in chart. EKG as below shows sinus tachycardia 102 PR of 144 QRS of 97 left atrial enlargement.   Hydronephrosis of right kidney Strict I/o. Foley as needed if pt starts retaining and not able to empty bladder.    GERD without esophagitis IV ppi therapy.    Tobacco use Nicotine patch.     DVT prophylaxis:  Heparin  Code Status:  Full code  Family Communication:  elliott,stacha (Other)  [phone] (Mobile)   Disposition Plan:  Group home  Consults called:  GI consult-Dr. Allegra Lai a.m. message sent.  Admission status: Inpatient.      Gertha Calkin MD [facility]  6 PM- 2 AM. Please contact me via secure Chat 6  PM-2 AM. [phone] ( Pager ) To contact the [facility] Attending or Consulting provider 7A - 7P or covering provider during after hours 7P -7A, for this patient.   Check the care team in [facility] and look for a) attending/consulting [facility] provider listed and b) the [facility] team listed Log into [email or web address] and use [facility] universal password to access. If you do not have the password, please contact the hospital operator. Locate the [facility] provider you are looking for under [facility] and page to a number that you can be directly reached. If you still have difficulty reaching the provider, please page the [facility] (Director on Call) for the Hospitalists listed on amion for assistance. [email or web address] 07/21/2021, 2:28 AM

## 2021-07-21 NOTE — Progress Notes (Signed)
Pharmacy - Clozapine     This patient's order has been reviewed for prescribing contraindications.   Clozapine REMS enrollment Verified: 07/21/21 Current Outpatient Monitoring: monthly  Home Regimen: clozapine 200 mg BID   Dose Adjustments This Admission: none   Labs:   ANC 8.7   Plan: Continue clozapine 200 mg BID Continue with weekly ANC labs while inpatient  **The medication is being dispensed pursuant to the FDA REMS suspension order of 11/29/19 that allows for dispensing without a patient REMS dispense authorization (RDA).    Elliot Gurney, PharmD Clinical Pharmacist  07/21/2021 9:55 AM

## 2021-07-21 NOTE — ED Notes (Signed)
Patient appears to be sleeping at this time. Respirations even and unlabored. NAD noted. Call light within reach. Bed locked and lowered.

## 2021-07-21 NOTE — Assessment & Plan Note (Addendum)
TNI 23 and 35.  We will continue to follow. suspect secondary to sepsis like picture.

## 2021-07-21 NOTE — Assessment & Plan Note (Addendum)
Pt had similar admission in April and speech eval then showed :  Dysphagia 1 (Puree);Dysphagia 2 (Fine chop)   Liquid Administration via: Straw Medication Administration: Crushed with puree Supervision: Staff to assist with self feeding;Full supervision/cueing for compensatory strategies Compensations: Minimize environmental distractions;Small sips/bites Postural Changes: Seated upright at 90 degrees;Remain upright for at least 30 minutes after po intake     We will do a bedside swallow and keep pt npo . Pt needs GI eval with UGI eval for any strictures or esophageal disorders or web as pt also has anemia that has been going on for past few cbc.     Latest Ref Rng & Units 07/20/2021    9:35 PM 05/12/2021    1:48 PM 04/30/2021    7:17 PM  CBC  WBC 4.0 - 10.5 K/uL 10.2  8.0  12.0   Hemoglobin 13.0 - 17.0 g/dL 55.2  17.4  71.5   Hematocrit 39.0 - 52.0 % 37.7  36.5  40.0   Platelets 150 - 400 K/uL 264  353  248     Will put in GI consult.

## 2021-07-21 NOTE — Assessment & Plan Note (Signed)
Patient has been anemic since 2019. We will request GI consult for evaluation of any GI sources of anemia in addition to evaluation of patient's recurrent aspiration swallowing difficulty or any structural symmetrical issues like esophageal webs or strictures peptic ulcer disease.  Anemia panel. Plan IV PPI therapy until then.

## 2021-07-21 NOTE — Evaluation (Addendum)
Clinical/Bedside Swallow Evaluation Patient Details  Name: Christopher Valdez. MRN: [id number] Date of Birth: 08-14-1960  Today's Date: 07/21/2021 Time: SLP Start Time (ACUTE ONLY): 1600 SLP Stop Time (ACUTE ONLY): 1700 SLP Time Calculation (min) (ACUTE ONLY): 60 min  Past Medical History:  Past Medical History:  Diagnosis Date   Calculus of kidney    Chronic constipation    Dyslipidemia    GERD (gastroesophageal reflux disease)    Proteinuria    Schizo-affective schizophrenia (HCC)    Vitamin D deficiency    Past Surgical History:  Past Surgical History:  Procedure Laterality Date   COLONOSCOPY  07/02/2012   CYSTOSCOPY W/ RETROGRADES Right 05/10/2019   Procedure: CYSTOSCOPY WITH RETROGRADE PYELOGRAM;  Surgeon: Sondra Come, MD;  Location: [facility] ORS;  Service: Urology;  Laterality: Right;   CYSTOSCOPY/URETEROSCOPY/HOLMIUM LASER/STENT PLACEMENT Right 05/10/2019   Procedure: CYSTOSCOPY/URETEROSCOPY/HOLMIUM LASER/STENT PLACEMENT;  Surgeon: Sondra Come, MD;  Location: [facility] OR[facility];  Service: Urology;  Laterality: Right;   HPI:  Pt  is a 61 y.o. male  hyperlipidemia, depression, anxiety, Schizoaffective disorder , GERD on a PPI who comes in for shortness of breath.  Patient comes in with [facility] where he resides for shortness of breath over the past 3 weeks but getting worse over the past couple days.  Patient has significant history of Choking -- choked on a piece of steak last admit and was seen by ST services.  He denies any falls, hitting his head, abdominal pain, leg pain, leg swelling.   CT of Chest: no PE;  2. Emphysema bronchial thickening.  Retained mucus in the trachea.  3. Bilateral anterior rib fractures with callus formation,  suggesting this is subacute or remote.  4. Left lower lobe 4 mm subpleural nodule.  CXR: Suspect mild scarring at the left lung base.    Assessment / Plan / Recommendation  Clinical Impression   Pt seen for BSE today. Upon entering room, pt  awake, sitting up in bed but repeatedly requested assistance in "moving the pillows" -- NSG reported this had been ongoing all day. Noted mild tremors/shakes in body/UEs. Pt verbal w/ 2-3 word responses but did not initiate engagement. Noted decreased awareness and insight overall during engagement, but he was able to tell me he lived in [place] and that other family lived in [place] and [place] area. He vaguely remembered the reason for his last admit: he "Choked on a piece of steak" not cut up. He stated he was eating "regular foods" at the Group Home.  Pt appears to present w/ grossly adequate oropharyngeal phase swallow function w/ Mild oral phase and oral prep stage dysphagia in setting of declined mental status and apparent deconditioning. Pt is also missing some Dentition; poor Dentition status overall. ANY decline  in mentation/Cognitive status can impact overall awareness during the oral phase and the timing of swallow thus impact safety during oral intake which increases risk for aspiration, Choking. Pt's risk for aspiration/choking can be reduced when following general aspiration precautions, support at meals for sitting upright, and using a modified diet consistency of broken down foods d/t Dentition status at baseline.  He required min-mod verbal/visual/tactile cues for follow through during po tasks and self-feeding.        Pt consumed several trials of ice chips, purees, soft/cut solids and thin liquids via cup/straw w/ NO overt clinical s/s of aspiration noted: no decline in vocal quality; no cough, and no decline in respiratory status during/post trials. O2 sats remained ~  97%. Oral phase was grossly adequate for bolus management and oral clearing of the boluses given though noted min decreased coordination during oral prep stage, mastication of softened solids, and oral clearing. Given Time and moistening the foods (broken small) appeared to aid the oral phase. Pt was also supported w/ feeding to  decreased exertion -- lost food from utensil intermittently. Pt was able to hold Cup during drinking which improves safety of swallowing. Cues given.  OM Exam appeared grossly [facility] w/ No unilateral weakness noted but tremorous oral/lingual activity observed. Some confusion of OM tasks and oral care noted. Hand over hand guidance and visual cues were helpful to complete oral care task.         In setting of baseline mental/Cognitive decline, missing/poor Dentition status, and risk for choking/aspiration, recommend initiation of the dysphagia level 3- Mech Soft diet(w/ CHOPPED meats), foods moistened for ease of oral phase/mastication; thin liquids; general aspiration precautions; reduce Distractions during meals and engage pt during meals for self-feeding. Pills Crushed in Puree for safer swallowing as needed. Support w/ feeding at meals as needed. REFLUX precautions. MD/NSG updated.  ST services recommends follow w/ Palliative Care for GOC and education re: impact of Cognitive/mental status decline on swallowing. Suspect pt is close to/at his baseline. Precautions posted in room. SLP Visit Diagnosis: Dysphagia, oral phase (R13.11) (baseline deficits in awareness/insight; missing Dentition)    Aspiration Risk  Mild aspiration risk;Risk for inadequate nutrition/hydration (reduced following general aspiration precautions)    Diet Recommendation   Mech Soft diet(w/ CHOPPED meats), foods moistened for ease of oral phase/mastication; thin liquids; general aspiration precautions; reduce Distractions during meals. Engage pt during meals for self-feeding. Support w/ feeding at meals as needed. Monitor bites for too large a piece of food at one time. REFLUX precautions.  Medication Administration: Crushed with puree (for safer swallowing)    Other  Recommendations Recommended Consults:  (Dietician f/u) Oral Care Recommendations: Oral care BID;Oral care before and after PO;Staff/trained caregiver to provide oral  care Other Recommendations:  (n/a)    Recommendations for follow up therapy are one component of a multi-disciplinary discharge planning process, led by the attending physician.  Recommendations may be updated based on patient status, additional functional criteria and insurance authorization.  Follow up Recommendations No SLP follow up (TBD)      Assistance Recommended at Discharge Intermittent Supervision/Assistance (baseline deficits)  Functional Status Assessment Patient has had a recent decline in their functional status and/or demonstrates limited ability to make significant improvements in function in a reasonable and predictable amount of time  Frequency and Duration min 1 x/week  1 week       Prognosis Prognosis for Safe Diet Advancement: Fair Barriers to Reach Goals: Time post onset;Severity of deficits;Language deficits;Cognitive deficits;Behavior Barriers/Prognosis Comment: baseline deficits      Swallow Study   General Date of Onset: 07/20/21 HPI: Pt  is a 61 y.o. male  hyperlipidemia, depression, anxiety, Schizoaffective disorder , GERD on a PPI who comes in for shortness of breath.  Patient comes in with [facility] where he resides for shortness of breath over the past 3 weeks but getting worse over the past couple days.  Patient has significant history of Choking -- choked on a piece of steak last admit and was seen by ST services.  He denies any falls, hitting his head, abdominal pain, leg pain, leg swelling.  CT of Chest: no PE; 2. Emphysema bronchial thickening.  Retained mucus in the trachea.  3.  Bilateral anterior rib fractures with callus formation,  suggesting this is subacute or remote.  4. Left lower lobe 4 mm subpleural nodule. CXR: Suspect mild scarring at the left lung base. Type of Study: Bedside Swallow Evaluation Previous Swallow Assessment: BSE on 05/01/2021 s/p admit for choking on a piece of steak at group home Diet Prior to this Study: Dysphagia  3 (soft);Thin liquids Temperature Spikes Noted: No (wbc 7.6) Respiratory Status: Room air (O2 removed by NSG earlier) History of Recent Intubation: No Behavior/Cognition: Alert;Cooperative;Pleasant mood;Distractible;Requires cueing (baseline Psychiatric dis.) Oral Cavity Assessment: Dry Oral Care Completed by SLP: Yes Oral Cavity - Dentition: Poor condition;Missing dentition Vision: Functional for self-feeding Self-Feeding Abilities: Able to feed self;Needs assist;Needs set up (shaky UEs) Patient Positioning: Upright in bed (needed positioning support) Baseline Vocal Quality: Low vocal intensity (muttered/mumbled speech) Volitional Cough: Strong Volitional Swallow: Able to elicit    Oral/Motor/Sensory Function Overall Oral Motor/Sensory Function: Mild impairment Facial ROM: Within Functional Limits Facial Symmetry: Within Functional Limits Facial Strength: Within Functional Limits Lingual ROM: Within Functional Limits Lingual Symmetry: Within Functional Limits Lingual Strength: Within Functional Limits (tremorous) Mandible: Within Functional Limits   Ice Chips Ice chips: Within functional limits Presentation: Spoon (fed; 3 trials)   Thin Liquid Thin Liquid: Within functional limits Presentation: Cup;Self Fed;Straw (2 sips via cup; ~4 ozs via straw) Other Comments: fed self w/ seutp    Nectar Thick Nectar Thick Liquid: Not tested   Honey Thick Honey Thick Liquid: Not tested   Puree Puree: Within functional limits Presentation: Self Fed;Spoon (supported d/t shaky UEs; 8 trials) Other Comments: min oral prep difficulty   Solid     Solid: Impaired (min) Presentation: Self Fed (6 trials) Oral Phase Impairments: Poor awareness of bolus;Impaired mastication (min) Oral Phase Functional Implications: Impaired mastication (min) Pharyngeal Phase Impairments:  (none) Other Comments: min oral prep difficulty         Jerilynn Som, MS, CCC-SLP Speech Language Pathologist Rehab  Services; [facility] -  [phone] (ascom) Kemet Nijjar 07/21/2021,5:40 PM

## 2021-07-21 NOTE — Assessment & Plan Note (Addendum)
Blood pressure (!) 171/96, pulse (!) 107, temperature 98.8 F (37.1 C), temperature source Rectal, resp. rate 20, height 6' (1.829 m), weight 72.4 kg, SpO2 97 %. PRN hydralazine. No blood pressure meds in chart. EKG as below shows sinus tachycardia 102 PR of 144 QRS of 97 left atrial enlargement.

## 2021-07-21 NOTE — Telephone Encounter (Signed)
Care giver checking status of referral  Advised referral has been placed as noted below

## 2021-07-21 NOTE — Assessment & Plan Note (Signed)
IV ppi therapy.  

## 2021-07-21 NOTE — Progress Notes (Signed)
Brief hospitalist update note.  This is a nonbillable note.  Please see same-day H&P from Dr. Allena Katz for full billable details.  Briefly, this is a 61 year old male who lives full-time at a group home who was brought in for chief complaint of shortness of breath.  Initially there was concern for GI origin aspiration.  Negative procalcitonin, no fevers, no leukocytosis makes aware from infectious process.  Suspect aspiration pneumonitis, noninfectious.  I suspect his primary diagnosis will be decompensated emphysema/COPD.  Patient is an active smoker.  We will treat as a COPD exacerbation with nebulizers and intravenous steroids.  Possible discharge in 24 hours.  GI consult on hold for now.  SLP evaluation requested.  Lolita Patella MD  No charge

## 2021-07-21 NOTE — Progress Notes (Signed)
       CROSS COVER NOTE  NAME: Christopher Valdez. MRN: [id number] DOB : 07/28/1960    Date of Service   07/21/21  HPI/Events of Note   Secure chat received from nursing "pt is agitated, maybe a little anxious, pressing his bell non-stop, the requests aren't substantial... to move the head of bed up or down or move the pillow up down left right. After you do the request, he hits the bell again. Can he get anything to help with rest? "  Interventions   Plan: Melatonin     This document was prepared using Dragon voice recognition software and may include unintentional dictation errors.  Bishop Limbo DNP, MHA, FNP-BC Nurse Practitioner [facility] Pager [phone]

## 2021-07-22 ENCOUNTER — Telehealth: Payer: Self-pay | Admitting: Family Medicine

## 2021-07-22 DIAGNOSIS — J441 Chronic obstructive pulmonary disease with (acute) exacerbation: Secondary | ICD-10-CM | POA: Diagnosis not present

## 2021-07-22 LAB — BLOOD GAS, VENOUS
Acid-Base Excess: 4.5 mmol/L — ABNORMAL HIGH (ref 0.0–2.0)
Bicarbonate: 28.4 mmol/L — ABNORMAL HIGH (ref 20.0–28.0)
O2 Saturation: 84 %
Patient temperature: 37
pCO2, Ven: 39 mmHg — ABNORMAL LOW (ref 44–60)
pH, Ven: 7.47 — ABNORMAL HIGH (ref 7.25–7.43)
pO2, Ven: 52 mmHg — ABNORMAL HIGH (ref 32–45)

## 2021-07-22 MED ORDER — CLOZAPINE 100 MG PO TABS
200.0000 mg | ORAL_TABLET | Freq: Two times a day (BID) | ORAL | 0 refills | Status: DC
Start: 2021-07-22 — End: 2021-09-02

## 2021-07-22 MED ORDER — PREDNISONE 20 MG PO TABS: 40.0000 mg | ORAL_TABLET | Freq: Every day | ORAL | 0 refills | Status: AC

## 2021-07-22 MED ORDER — PREDNISONE 20 MG PO TABS
40.0000 mg | ORAL_TABLET | Freq: Every day | ORAL | Status: DC
  Administered 2021-07-22: 40 mg via ORAL
  Filled 2021-07-22: qty 2

## 2021-07-22 NOTE — Plan of Care (Signed)

## 2021-07-22 NOTE — Progress Notes (Signed)
Pt discharging back to group home. Discharge packet given to transporter. PIV removed and belongings given back to patient.

## 2021-07-22 NOTE — TOC Progression Note (Signed)
Transition of Care (TOC) - Progression Note    Patient Details  Name: Christopher Valdez. MRN: [id number] Date of Birth: 09/04/1960  Transition of Care [facility]) CM/SW Contact  Maree Krabbe, LCSW Phone Number: 07/22/2021, 10:52 AM  Clinical Narrative:   CSW confirmed with Jasmine at [facility] that pt can return today. They will come and pick pt up once he is dc. Jasmine did have some RN questions. CSW notified RN to please give her a call.          Expected Discharge Plan and Services                                                 Social Determinants of Health (SDOH) Interventions    Readmission Risk Interventions     No data to display

## 2021-07-22 NOTE — Discharge Summary (Signed)
Physician Discharge Summary  Christopher Valdez. YNW:[id number] DOB: 1960/04/06 DOA: 07/20/2021  PCP: Alba Cory, MD  Admit date: 07/20/2021 Discharge date: 07/22/2021  Admitted From: Group home Disposition:  Group Home  Recommendations for Outpatient Follow-up:  Follow up with PCP in 1-2 weeks Follow up with psychiatry for refill needs  Home Health:No  Equipment/Devices:None   Discharge Condition:Stable  CODE STATUS:FULL  Diet recommendation: Dysphagia 3  Brief/Interim Summary: 61 year old male who lives full-time at a group home who was brought in for chief complaint of shortness of breath.  Initially there was concern for GI origin aspiration.  Negative procalcitonin, no fevers, no leukocytosis makes aware from infectious process.  Suspect aspiration pneumonitis, noninfectious.  I suspect his primary diagnosis will be decompensated emphysema/COPD.  Patient is an active smoker.  We will treat as a COPD exacerbation with nebulizers and intravenous steroids  Respiratory status improved.  Seen by SLP.  Placed on dysphagia 3 diet.  No indication for endoscopy.  No indication of aspiration pneumonia.  No indication for antibiotics.  Suspect mild flare of COPD/emphysema.  Patient is an active smoker.  Respiratory status improved at time of discharge.  Will discharge on prednisone 40 mg a day x5 days.  Can resume home albuterol regimen.  Per group home request I have refilled patient's home Clazuril x5 days.  They will need to seek follow-up with patient's psychiatrist for further refills.    Discharge Diagnoses:  Principal Problem:   Aspiration pneumonia (HCC) Active Problems:   Tobacco use   GERD without esophagitis   Hydronephrosis of right kidney   Essential hypertension   Sepsis (HCC)   Anemia   Hypokalemia   Elevated troponin   COPD exacerbation (HCC)  COPD exacerbation I suspect this is the primary driver for patient's presentation and shortness of breath.  Patient is an  active smoker.  No signs of aspiration pneumonia.  No signs of infectious process.  No indication for endoscopic evaluation.  Hemoglobin stable.  No signs of GI blood loss.  Can resume PTA albuterol as needed regimen.  Will prescribe prednisone 40 mg a day x5 days.  Stable for discharge.  Can return to group home.  Is on room air at time of discharge.  Discharge Instructions  Discharge Instructions     Diet - low sodium heart healthy   Complete by: As directed    Increase activity slowly   Complete by: As directed       Allergies as of 07/22/2021       Reactions   Tall Ragweed Other (See Comments)   Pollen        Medication List     TAKE these medications    acetaminophen 500 MG tablet Commonly known as: TYLENOL Take 1,000 mg by mouth every 4 (four) hours as needed for mild pain or headache.   albuterol 108 (90 Base) MCG/ACT inhaler Commonly known as: VENTOLIN HFA Inhale 2 puffs into the lungs every 6 (six) hours as needed for wheezing or shortness of breath.   cloZAPine 100 MG tablet Commonly known as: CLOZARIL Take 2 tablets (200 mg total) by mouth 2 (two) times daily for 5 days.   divalproex 500 MG 24 hr tablet Commonly known as: DEPAKOTE ER Take 500 mg by mouth at bedtime.   FLUoxetine 40 MG capsule Commonly known as: PROZAC Take 40 mg by mouth daily.   Ingrezza 80 MG capsule Generic drug: valbenazine Take 80 mg by mouth daily.   omeprazole 40 MG  capsule Commonly known as: PRILOSEC TAKE 1 CAPSULE BY MOUTH DAILY What changed:  how much to take how to take this when to take this additional instructions   polycarbophil 625 MG tablet Commonly known as: FIBERCON Take 625 mg by mouth daily.   predniSONE 20 MG tablet Commonly known as: DELTASONE Take 2 tablets (40 mg total) by mouth daily with breakfast for 5 days. Start taking on: July 23, 2021   simvastatin 20 MG tablet Commonly known as: ZOCOR TAKE 1 TABLET BY MOUTH DAILY What changed: when to  take this   Vitamin D3 50 MCG (2000 UT) capsule TAKE 1 CAPSULE BY MOUTH DAILY        Allergies  Allergen Reactions   Tall Ragweed Other (See Comments)    Pollen    Consultations: None   Procedures/Studies: CT ABDOMEN PELVIS W CONTRAST  Result Date: 07/20/2021 CLINICAL DATA:  Acute abdominal pain. EXAM: CT ABDOMEN AND PELVIS WITH CONTRAST TECHNIQUE: Multidetector CT imaging of the abdomen and pelvis was performed using the standard protocol following bolus administration of intravenous contrast. RADIATION DOSE REDUCTION: This exam was performed according to the departmental dose-optimization program which includes automated exposure control, adjustment of the mA and/or kV according to patient size and/or use of iterative reconstruction technique. CONTRAST:  75mL OMNIPAQUE IOHEXOL 350 MG/ML SOLN COMPARISON:  CT 05/01/2019 FINDINGS: Lower chest: Assessed on concurrent chest CT, reported separately. Hepatobiliary: No focal liver abnormality is seen. No gallstones, gallbladder wall thickening, or biliary dilatation. Pancreas: Parenchymal atrophy. No ductal dilatation or inflammation. Spleen: Normal in size without focal abnormality. Adrenals/Urinary Tract: No adrenal nodule. There is mild right hydronephrosis and proximal hydroureter, however no ureteral stone or cause for obstruction. There is symmetric excretion on delayed phase imaging. Punctate nonobstructing intrarenal stones on the left. Tiny bilateral renal cortical hypodensities may represent small cysts or sequela of lithium therapy. No suspicious renal lesion. Small bladder distension, no wall thickening. Stomach/Bowel: Detailed bowel assessment is limited in the absence of enteric contrast and patient motion. There is diffuse gastric wall thickening. Fluid-filled mildly prominent loops of small bowel in the upper abdomen, but no discrete transition point or evidence of obstruction. No associated wall thickening. Small bowel projects  anterior to the stomach and transverse colon. Normal appendix. No colonic inflammation. Vascular/Lymphatic: Calcified noncalcified atheromatous plaque in the abdominal aorta. No aneurysm. Patent portal vein. No bulky abdominopelvic adenopathy. Reproductive: Prostate is unremarkable. Other: No free air or ascites.  No abdominal wall hernia. Musculoskeletal: Hemi transitional lumbosacral anatomy. Stable left iliac sclerotic focus likely bone island. There are no acute or suspicious osseous abnormalities. IMPRESSION: 1. Fluid-filled mildly prominent loops of small bowel in the upper abdomen. Small bowel is displaced anterior to the stomach and transverse colon which may represent internal hernia, no evidence of inflammation, bowel ischemia or obstruction. 2. Mild right hydronephrosis and proximal hydroureter, however no ureteral stone or cause for obstruction. Recommend correlation with urinalysis. 3. Punctate nonobstructing left nephrolithiasis. 4. Chronic gastric wall thickening. Aortic Atherosclerosis (ICD10-I70.0). Electronically Signed   By: Narda RutherfordMelanie  Sanford M.D.   On: 07/20/2021 23:46   CT Angio Chest PE W and/or Wo Contrast  Result Date: 07/20/2021 CLINICAL DATA:  Pulmonary embolism (PE) suspected, high prob Shortness of breath. EXAM: CT ANGIOGRAPHY CHEST WITH CONTRAST TECHNIQUE: Multidetector CT imaging of the chest was performed using the standard protocol during bolus administration of intravenous contrast. Multiplanar CT image reconstructions and MIPs were obtained to evaluate the vascular anatomy. RADIATION DOSE REDUCTION: This exam was performed  according to the departmental dose-optimization program which includes automated exposure control, adjustment of the mA and/or kV according to patient size and/or use of iterative reconstruction technique. CONTRAST:  75mL OMNIPAQUE IOHEXOL 350 MG/ML SOLN COMPARISON:  Radiograph earlier today. FINDINGS: Cardiovascular: There are no filling defects within the  pulmonary arteries to suggest pulmonary embolus. The thoracic aorta is normal in caliber. No acute aortic findings. Conventional branching pattern from the aortic arch. Heart is normal in size. No pericardial effusion. Mediastinum/Nodes: Shotty mediastinal and hilar lymph nodes, not enlarged by size criteria. Tiny hiatal hernia. No thyroid nodule. Lungs/Pleura: Mild emphysema. Central bronchial thickening. Mild dependent atelectasis in both lungs. Retained mucus within the trachea. 4 mm subpleural nodule in the left lower lobe series 7, image 83. Upper Abdomen: Assessed on concurrent abdominal CT, reported separately. Musculoskeletal: Left anterior third through seventh rib fractures with callus formation. Fractures of right anterior six through eighth ribs have callus formation. No acute osseous findings. Left gynecomastia Review of the MIP images confirms the above findings. IMPRESSION: 1. No pulmonary embolus. 2. Emphysema bronchial thickening.  Retained mucus in the trachea. 3. Bilateral anterior rib fractures with callus formation, suggesting this is subacute or remote. 4. Left lower lobe 4 mm subpleural nodule. No routine follow-up imaging is recommended per Fleischner Society Guidelines. These guidelines do not apply to immunocompromised patients and patients with cancer. Follow up in patients with significant comorbidities as clinically warranted. For lung cancer screening, adhere to Lung-RADS guidelines. Reference: Radiology. 2017; 284(1):228-43. Emphysema (ICD10-J43.9). Electronically Signed   By: Narda Rutherford M.D.   On: 07/20/2021 23:38   DG Chest Portable 1 View  Result Date: 07/20/2021 CLINICAL DATA:  Shortness of breath EXAM: PORTABLE CHEST 1 VIEW COMPARISON:  04/30/2021 FINDINGS: No acute consolidation or effusion. Probable mild scarring at the left lung base. Normal cardiac size. No pneumothorax. IMPRESSION: Suspect mild scarring at the left lung base. Electronically Signed   By: Jasmine Pang M.D.   On: 07/20/2021 22:15      Subjective: Seen and examined at the time of discharge.  Respiratory status stable.  In no distress.  Stable for discharge back to group home.  Discharge Exam: Vitals:   07/21/21 2002 07/22/21 0258  BP: (!) 165/84 (!) 133/59  Pulse: 97 (!) 102  Resp: 20 17  Temp: 98.8 F (37.1 C) 98.9 F (37.2 C)  SpO2: 95% 93%   Vitals:   07/21/21 1322 07/21/21 1608 07/21/21 2002 07/22/21 0258  BP: (!) 157/81 (!) 143/68 (!) 165/84 (!) 133/59  Pulse: (!) 105 93 97 (!) 102  Resp: Temp: 98.4 F (36.9 C) 98.2 F (36.8 C) 98.8 F (37.1 C) 98.9 F (37.2 C)  TempSrc: Oral Oral  Oral  SpO2: 97%  95% 93%  Weight:      Height:        General: Pt is alert, awake, not in acute distress Cardiovascular: RRR, S1/S2 +, no rubs, no gallops Respiratory: CTA bilaterally, no wheezing, no rhonchi Abdominal: Soft, NT, ND, bowel sounds + Extremities: no edema, no cyanosis    The results of significant diagnostics from this hospitalization (including imaging, microbiology, ancillary and laboratory) are listed below for reference.     Microbiology: Recent Results (from the past 240 hour(s))  Blood culture (routine x 2)     Status: None (Preliminary result)   Collection Time: 07/20/21  9:35 PM   Specimen: BLOOD  Result Value Ref Range Status   Specimen Description BLOOD LEFT  ASSIST CONTROL  Final   Special Requests   Final    BOTTLES DRAWN AEROBIC AND ANAEROBIC Blood Culture results may not be optimal due to an excessive volume of blood received in culture bottles   Culture   Final    NO GROWTH 2 DAYS Performed at [facility], [address], [place], [place] [address]    Report Status PENDING  Incomplete  Blood culture (routine x 2)     Status: None (Preliminary result)   Collection Time: 07/20/21  9:35 PM   Specimen: BLOOD  Result Value Ref Range Status   Specimen Description BLOOD RIGHT WRIST  Final   Special Requests   Final     BOTTLES DRAWN AEROBIC AND ANAEROBIC Blood Culture results may not be optimal due to an excessive volume of blood received in culture bottles   Culture   Final    NO GROWTH 2 DAYS Performed at [facility], [address] [place], [place] [address]    Report Status PENDING  Incomplete  SARS Coronavirus 2 by RT PCR (hospital order, performed in [facility] hospital lab) *cepheid single result test* Anterior Nasal Swab     Status: None   Collection Time: 07/20/21  9:35 PM   Specimen: Anterior Nasal Swab  Result Value Ref Range Status   SARS Coronavirus 2 by RT PCR NEGATIVE NEGATIVE Final    Comment: (NOTE) SARS-CoV-2 target nucleic acids are NOT DETECTED.  The SARS-CoV-2 RNA is generally detectable in upper and lower respiratory specimens during the acute phase of infection. The lowest concentration of SARS-CoV-2 viral copies this assay can detect is 250 copies / mL. A negative result does not preclude SARS-CoV-2 infection and should not be used as the sole basis for treatment or other patient management decisions.  A negative result may occur with improper specimen collection / handling, submission of specimen other than nasopharyngeal swab, presence of viral mutation(s) within the areas targeted by this assay, and inadequate number of viral copies (<250 copies / mL). A negative result must be combined with clinical observations, patient history, and epidemiological information.  Fact Sheet for Patients:   RoadLapTop.co.za  Fact Sheet for Healthcare Providers: http://kim-miller.com/  This test is not yet approved or  cleared by the [place] FDA and has been authorized for detection and/or diagnosis of SARS-CoV-2 by FDA under an Emergency Use Authorization (EUA).  This EUA will remain in effect (meaning this test can be used) for the duration of the COVID-19 declaration under Section 564(b)(1) of the Act, 21  U.S.C. section 360bbb-3(b)(1), unless the authorization is terminated or revoked sooner.  Performed at [facility], [address] [place], [place] [address]      Labs: BNP (last 3 results) Recent Labs    07/20/21 2135  BNP 11.1   Basic Metabolic Panel: Recent Labs  Lab 07/20/21 2135 07/21/21 0250 07/21/21 0533  NA 134*  --  140  K 3.1*  --  3.9  CL 97*  --  107  CO2 24  --  26  GLUCOSE 115*  --  111*  BUN 7*  --  8  CREATININE 1.09 1.01 0.97  CALCIUM 9.5  --  9.4   Liver Function Tests: Recent Labs  Lab 07/20/21 2135 07/21/21 0533  AST 35 38  ALT 22 19  ALKPHOS 106 101  BILITOT 0.8 1.0  PROT 7.3 6.9  ALBUMIN 4.3 4.0   No results for input(s): "LIPASE", "AMYLASE" in the last 168  hours. No results for input(s): "AMMONIA" in the last 168 hours. CBC: Recent Labs  Lab 07/20/21 2135 07/21/21 0250 07/21/21 0533  WBC 10.2 9.5 7.6  NEUTROABS 8.7*  --   --   HGB 12.3* 11.8* 12.7*  HCT 37.7* 36.2* 39.3  MCV 86.5 88.1 88.5  PLT 264 240 252   Cardiac Enzymes: No results for input(s): "CKTOTAL", "CKMB", "CKMBINDEX", "TROPONINI" in the last 168 hours. BNP: Invalid input(s): "POCBNP" CBG: No results for input(s): "GLUCAP" in the last 168 hours. D-Dimer No results for input(s): "DDIMER" in the last 72 hours. Hgb A1c No results for input(s): "HGBA1C" in the last 72 hours. Lipid Profile No results for input(s): "CHOL", "HDL", "LDLCALC", "TRIG", "CHOLHDL", "LDLDIRECT" in the last 72 hours. Thyroid function studies No results for input(s): "TSH", "T4TOTAL", "T3FREE", "THYROIDAB" in the last 72 hours.  Invalid input(s): "FREET3" Anemia work up No results for input(s): "VITAMINB12", "FOLATE", "FERRITIN", "TIBC", "IRON", "RETICCTPCT" in the last 72 hours. Urinalysis    Component Value Date/Time   COLORURINE STRAW (A) 07/20/2021 2135   APPEARANCEUR CLEAR (A) 07/20/2021 2135   APPEARANCEUR Cloudy (A) 05/16/2019 1413   LABSPEC 1.003 (L) 07/20/2021  2135   PHURINE 7.0 07/20/2021 2135   GLUCOSEU NEGATIVE 07/20/2021 2135   HGBUR SMALL (A) 07/20/2021 2135   BILIRUBINUR NEGATIVE 07/20/2021 2135   BILIRUBINUR Negative 05/16/2019 1413   KETONESUR 5 (A) 07/20/2021 2135   PROTEINUR NEGATIVE 07/20/2021 2135   NITRITE NEGATIVE 07/20/2021 2135   LEUKOCYTESUR NEGATIVE 07/20/2021 2135   Sepsis Labs Recent Labs  Lab 07/20/21 2135 07/21/21 0250 07/21/21 0533  WBC 10.2 9.5 7.6   Microbiology Recent Results (from the past 240 hour(s))  Blood culture (routine x 2)     Status: None (Preliminary result)   Collection Time: 07/20/21  9:35 PM   Specimen: BLOOD  Result Value Ref Range Status   Specimen Description BLOOD LEFT ASSIST CONTROL  Final   Special Requests   Final    BOTTLES DRAWN AEROBIC AND ANAEROBIC Blood Culture results may not be optimal due to an excessive volume of blood received in culture bottles   Culture   Final    NO GROWTH 2 DAYS Performed at [facility], [address], [place], [place] [address]    Report Status PENDING  Incomplete  Blood culture (routine x 2)     Status: None (Preliminary result)   Collection Time: 07/20/21  9:35 PM   Specimen: BLOOD  Result Value Ref Range Status   Specimen Description BLOOD RIGHT WRIST  Final   Special Requests   Final    BOTTLES DRAWN AEROBIC AND ANAEROBIC Blood Culture results may not be optimal due to an excessive volume of blood received in culture bottles   Culture   Final    NO GROWTH 2 DAYS Performed at [facility], [address] [place], [place] [address]    Report Status PENDING  Incomplete  SARS Coronavirus 2 by RT PCR (hospital order, performed in [facility] hospital lab) *cepheid single result test* Anterior Nasal Swab     Status: None   Collection Time: 07/20/21  9:35 PM   Specimen: Anterior Nasal Swab  Result Value Ref Range Status   SARS Coronavirus 2 by RT PCR NEGATIVE NEGATIVE Final    Comment: (NOTE) SARS-CoV-2 target nucleic acids  are NOT DETECTED.  The SARS-CoV-2 RNA is generally detectable in upper and lower respiratory specimens during the acute phase of infection. The lowest concentration of SARS-CoV-2 viral copies  this assay can detect is 250 copies / mL. A negative result does not preclude SARS-CoV-2 infection and should not be used as the sole basis for treatment or other patient management decisions.  A negative result may occur with improper specimen collection / handling, submission of specimen other than nasopharyngeal swab, presence of viral mutation(s) within the areas targeted by this assay, and inadequate number of viral copies (<250 copies / mL). A negative result must be combined with clinical observations, patient history, and epidemiological information.  Fact Sheet for Patients:   RoadLapTop.co.za  Fact Sheet for Healthcare Providers: http://kim-miller.com/  This test is not yet approved or  cleared by the [place] FDA and has been authorized for detection and/or diagnosis of SARS-CoV-2 by FDA under an Emergency Use Authorization (EUA).  This EUA will remain in effect (meaning this test can be used) for the duration of the COVID-19 declaration under Section 564(b)(1) of the Act, 21 U.S.C. section 360bbb-3(b)(1), unless the authorization is terminated or revoked sooner.  Performed at [facility], [address], [place], [place] [address]      Time coordinating discharge: Over 30 minutes  SIGNED:   Tresa Moore, MD  [facility] 07/22/2021, 11:02 AM Pager   If 7PM-7AM, please contact night-coverage

## 2021-07-22 NOTE — Telephone Encounter (Signed)
Dr Carlynn Purl he has an appt on 08/03/21 already, is this ok for him?  Copied from CRM [id number]. Topic: General - Other >> Jul 22, 2021 11:15 AM Everette C wrote: Reason for CRM: The patient's caregiver [name] has called to request an order for lab work for a CBC panel  The patient is being discharged today at 11:30 from [facility]  Please contact the patient's caregiver further if needed

## 2021-07-23 ENCOUNTER — Telehealth: Payer: Self-pay

## 2021-07-23 NOTE — Telephone Encounter (Signed)
Transition Care Management Unsuccessful Follow-up Telephone Call  Date of discharge and from where:  Discharged from [facility] on 07/22/21.  Attempts:  1st Attempt  Reason for unsuccessful TCM follow-up call:  Left voice message    [facility] Care Management [phone]

## 2021-07-23 NOTE — Telephone Encounter (Signed)
Thank you :)

## 2021-07-25 LAB — CULTURE, BLOOD (ROUTINE X 2)
Culture: NO GROWTH
Culture: NO GROWTH

## 2021-07-26 ENCOUNTER — Other Ambulatory Visit: Payer: Self-pay | Admitting: *Deleted

## 2021-07-26 NOTE — Patient Outreach (Signed)
  Care Coordination TOC Note Transition Care Management Unsuccessful Follow-up Telephone Call  Date of discharge and from where:  07/22/21 [facility]  Attempts:  2nd Attempt  Reason for unsuccessful TCM follow-up call:  Left voice message.  Takya Vandivier RN, BSN [facility] Care Management [facility] [phone] [email or web address]   

## 2021-07-27 ENCOUNTER — Other Ambulatory Visit: Payer: Self-pay | Admitting: *Deleted

## 2021-07-27 NOTE — Patient Outreach (Signed)
  Care Coordination [facility] Note Transition Care Management Unsuccessful Follow-up Telephone Call  Date of discharge and from where:  07/22/21 [facility]  Attempts:  3rd Attempt  Reason for unsuccessful TCM follow-up call:  Left voice message.  Blanchie Serve RN, BSN [facility] Care Management [facility] [phone] [email or web address]

## 2021-08-02 NOTE — Progress Notes (Unsigned)
Name: Christopher Valdez.   MRN: [id number]    DOB: 28-Jul-1960   Date:08/03/2021       Progress Note  Subjective  Chief Complaint  Follow Up  HPI  Hospital discharge follow up:   Admission date: 07/20/2021  Discharge date: 07/22/2021   Weight was 181 lbs January 2023 and is down to 145 lbs today - total of 36 lbs in 6 months - 10 % weight loss  Weight was 167 lbs May 03/23 and is down to 145 lbs today - 12 lbs in less than 3 months.   He was admitted 04/2021 for aspiration pneumonia, choked on a steak. After that that episode he has been afraid to eat and drinking more water with meals to be able to push food down.   Lat admission was secondary to SOB, he was diagnosed with COPD flare and advised to take prednisone for 5 days after discharge. He continues to smoke and has a wet sounding cough, he was also advised to change diet to a dysphagia 3 diet. He is feeling better, only taking albuterol prn. CT lung showed emphysema  Reviewed CT scan abdomen/pelvis and CT angiogram chest: discussed results with patient and caregiver - Blase Mess - he lives in a group home   Medication reconciliation done today   Atherosclerosis: continue statin therapy   Patient Active Problem List   Diagnosis Date Noted   Atherosclerosis of aorta (HCC) 08/03/2021   Anemia 07/21/2021   Hypokalemia 07/21/2021   Elevated troponin 07/21/2021   COPD exacerbation (HCC) 07/21/2021   Pressure injury of skin 05/02/2021   History of aspiration pneumonia 05/01/2021   Essential hypertension 02/17/2020   Other disorders of kidney and ureter in diseases classified elsewhere 02/17/2020   Smokers' cough (HCC) 04/09/2019   Hydronephrosis of right kidney 04/03/2018   Right kidney stone 04/03/2018   Avitaminosis D 10/29/2014   Enlarged prostate 10/29/2014   Other schizoaffective disorders (HCC) 10/10/2014   Proteinuria 10/10/2014   Hyperglycemia 10/10/2014   Dyslipidemia 10/10/2014   History of kidney stones 10/10/2014    External hemorrhoid 10/10/2014   Chronic constipation 10/10/2014   Tobacco use 10/10/2014   GERD without esophagitis 10/10/2014    Past Surgical History:  Procedure Laterality Date   COLONOSCOPY  07/02/2012   CYSTOSCOPY W/ RETROGRADES Right 05/10/2019   Procedure: CYSTOSCOPY WITH RETROGRADE PYELOGRAM;  Surgeon: Sondra Come, MD;  Location: [facility];  Service: Urology;  Laterality: Right;   CYSTOSCOPY/URETEROSCOPY/HOLMIUM LASER/STENT PLACEMENT Right 05/10/2019   Procedure: CYSTOSCOPY/URETEROSCOPY/HOLMIUM LASER/STENT PLACEMENT;  Surgeon: Sondra Come, MD;  Location: [facility];  Service: Urology;  Laterality: Right;    Family History  Problem Relation Age of Onset   Parkinson's disease Mother    Deep vein thrombosis Father    Osteoarthritis Father     Social History   Tobacco Use   Smoking status: Every Day    Packs/day: 1.00    Years: 40.00    Total pack years: 40.00    Types: Cigarettes   Smokeless tobacco: Never  Substance Use Topics   Alcohol use: No    Alcohol/week: 0.0 standard drinks of alcohol     Current Outpatient Medications:    acetaminophen (TYLENOL) 500 MG tablet, Take 1,000 mg by mouth every 4 (four) hours as needed for mild pain or headache. , Disp: , Rfl:    albuterol (VENTOLIN HFA) 108 (90 Base) MCG/ACT inhaler, Inhale 2 puffs into the lungs every 6 (six) hours as needed for wheezing or shortness  of breath., Disp: 8 g, Rfl: 2   Cholecalciferol (VITAMIN D3) 50 MCG (2000 UT) capsule, TAKE 1 CAPSULE BY MOUTH DAILY (Patient taking differently: Take 2,000 Units by mouth daily.), Disp: 30 capsule, Rfl: 12   divalproex (DEPAKOTE ER) 500 MG 24 hr tablet, Take 500 mg by mouth at bedtime. , Disp: , Rfl:    FLUoxetine (PROZAC) 40 MG capsule, Take 40 mg by mouth daily., Disp: , Rfl:    INGREZZA 80 MG capsule, Take 80 mg by mouth daily., Disp: , Rfl:    omeprazole (PRILOSEC) 40 MG capsule, TAKE 1 CAPSULE BY MOUTH DAILY (Patient taking differently: Take 40 mg by  mouth daily.), Disp: 30 capsule, Rfl: 5   polycarbophil (FIBERCON) 625 MG tablet, Take 625 mg by mouth daily., Disp: , Rfl:    simvastatin (ZOCOR) 20 MG tablet, TAKE 1 TABLET BY MOUTH DAILY (Patient taking differently: Take 20 mg by mouth daily at 6 PM.), Disp: 30 tablet, Rfl: 5   cloZAPine (CLOZARIL) 100 MG tablet, Take 2 tablets (200 mg total) by mouth 2 (two) times daily for 5 days., Disp: 20 tablet, Rfl: 0  Allergies  Allergen Reactions   Tall Ragweed Other (See Comments)    Pollen    I personally reviewed active problem list, medication list, allergies, family history, social history, health maintenance with the patient/caregiver today.   ROS  Constitutional: Negative for fever , positive  weight change.  Respiratory: positive  for cough and shortness of breath.   Cardiovascular: Negative for chest pain or palpitations.  Gastrointestinal: Negative for abdominal pain, no bowel changes. Dysphagia   Musculoskeletal: Negative for gait problem or joint swelling.  Skin: Negative for rash.  Neurological: Negative for dizziness or headache.  No other specific complaints in a complete review of systems (except as listed in HPI above).   Objective  Vitals:   08/03/21 1304  BP: 138/80  Pulse: 97  Resp: 18  Temp: 97.8 F (36.6 C)  TempSrc: Oral  SpO2: 100%  Weight: 145 lb 6.4 oz (66 kg)  Height: 6' (1.829 m)    Body mass index is 19.72 kg/m.  Physical Exam  Constitutional: Patient appears well-developed malnourished  No distress.  HEENT: head atraumatic, normocephalic, pupils equal and reactive to light, neck supple Cardiovascular: Normal rate, regular rhythm and normal heart sounds.  No murmur heard. No BLE edema. Pulmonary/Chest: Effort normal and breath sounds normal. No respiratory distress. Abdominal: Soft.  There is no tenderness. Psychiatric: Patient has a normal mood and affect. behavior is normal. Judgment and thought content normal.    PHQ2/9:    08/03/2021     1:17 PM 05/12/2021   12:57 PM 04/13/2021    2:31 PM 02/02/2021   12:57 PM 09/29/2020    9:57 AM  Depression screen PHQ 2/9  Decreased Interest 0 0 1 0 0  Down, Depressed, Hopeless 2 0 1 0 0  PHQ - 2 Score 2 0 2 0 0  Altered sleeping 1  0 0   Tired, decreased energy 1  1 0   Change in appetite 1  0 0   Feeling bad or failure about yourself  0  0 0   Trouble concentrating 0  1 0   Moving slowly or fidgety/restless 0  0 0   Suicidal thoughts 0  0 0   PHQ-9 Score 5  4 0   Difficult doing work/chores Somewhat difficult  Not difficult at all      phq 9 is positive  Fall Risk:    08/03/2021    1:16 PM 05/12/2021   12:57 PM 04/13/2021    2:33 PM 02/02/2021   12:51 PM 09/29/2020    9:57 AM  Fall Risk   Falls in the past year? 0 0 0 0 0  Number falls in past yr:  0 0    Injury with Fall?  0 0    Risk for fall due to : Impaired balance/gait  No Fall Risks    Follow up Falls prevention discussed;Falls evaluation completed;Education provided Falls evaluation completed Falls prevention discussed Falls prevention discussed Falls prevention discussed      Functional Status Survey: Is the patient deaf or have difficulty hearing?: No Does the patient have difficulty seeing, even when wearing glasses/contacts?: No Does the patient have difficulty concentrating, remembering, or making decisions?: No Does the patient have difficulty walking or climbing stairs?: Yes Does the patient have difficulty dressing or bathing?: No Does the patient have difficulty doing errands alone such as visiting a doctor's office or shopping?: No    Assessment & Plan   1. Hospital discharge follow-up ***  2. Atherosclerosis of aorta (HCC) ***  3. Moderate protein-calorie malnutrition (HCC) *** - Ambulatory referral to Gastroenterology - umeclidinium-vilanterol (ANORO ELLIPTA) 62.5-25 MCG/ACT AEPB; Inhale 1 puff into the lungs daily at 6 (six) AM.  Dispense: 1 each; Refill: 5  4. Other emphysema  (HCC) ***  5. History of aspiration pneumonia *** - Ambulatory referral to Gastroenterology  6. Unintentional weight loss of more than 10% body weight within 6 months *** - Ambulatory referral to Gastroenterology  7. Dysphagia causing pulmonary aspiration with swallowing *** - Ambulatory referral to Gastroenterology

## 2021-08-03 ENCOUNTER — Encounter: Payer: Self-pay | Admitting: Family Medicine

## 2021-08-03 ENCOUNTER — Ambulatory Visit (INDEPENDENT_AMBULATORY_CARE_PROVIDER_SITE_OTHER): Payer: Medicare Other | Admitting: Family Medicine

## 2021-08-03 VITALS — BP 138/80 | HR 97 | Temp 97.8°F | Resp 18 | Ht 72.0 in | Wt 145.4 lb

## 2021-08-03 DIAGNOSIS — E440 Moderate protein-calorie malnutrition: Secondary | ICD-10-CM | POA: Diagnosis not present

## 2021-08-03 DIAGNOSIS — I700 Atherosclerosis of aorta: Secondary | ICD-10-CM

## 2021-08-03 DIAGNOSIS — Z8701 Personal history of pneumonia (recurrent): Secondary | ICD-10-CM

## 2021-08-03 DIAGNOSIS — Z09 Encounter for follow-up examination after completed treatment for conditions other than malignant neoplasm: Secondary | ICD-10-CM

## 2021-08-03 DIAGNOSIS — J438 Other emphysema: Secondary | ICD-10-CM | POA: Insufficient documentation

## 2021-08-03 DIAGNOSIS — R1319 Other dysphagia: Secondary | ICD-10-CM

## 2021-08-03 DIAGNOSIS — E43 Unspecified severe protein-calorie malnutrition: Secondary | ICD-10-CM | POA: Insufficient documentation

## 2021-08-03 DIAGNOSIS — R634 Abnormal weight loss: Secondary | ICD-10-CM

## 2021-08-03 MED ORDER — UMECLIDINIUM-VILANTEROL 62.5-25 MCG/ACT IN AEPB: 1.0000 | INHALATION_SPRAY | Freq: Every day | RESPIRATORY_TRACT | 5 refills | Status: AC

## 2021-08-09 ENCOUNTER — Other Ambulatory Visit: Payer: Self-pay | Admitting: Family Medicine

## 2021-08-10 ENCOUNTER — Telehealth: Payer: Self-pay | Admitting: Family Medicine

## 2021-08-10 NOTE — Telephone Encounter (Signed)
Letter written and printed. Pt aware it is faxed.

## 2021-08-10 NOTE — Telephone Encounter (Unsigned)
Copied from CRM [id number]. Topic: General - Inquiry >> Aug 10, 2021  3:42 PM Haroldine Laws wrote: Reason for CRM: Pt called saying he missed worked on Saturday He said he seen Dr. Carlynn Purl last week. He would like a note for work being out. Please fax to [phone]

## 2021-08-25 NOTE — Progress Notes (Deleted)
Psychiatric Initial Adult Assessment   Patient Identification: Christopher Valdez. MRN:  [id number] Date of Evaluation:  08/25/2021 Referral Source: *** Chief Complaint:  No chief complaint on file.  Visit Diagnosis: No diagnosis found.  History of Present Illness:   Christopher Valdez. is a 61 y.o. year old male with a history of schizoaffective disorder, COPD, hypertension, GERD, who is referred for schizoaffective disorder.       Medication- clozapine 100 mg twice a daymg, depakote 500 mg, fluoxetine 40 mg, ingrezza 80 mg daily    Associated Signs/Symptoms: Depression Symptoms:  {DEPRESSION SYMPTOMS:20000} (Hypo) Manic Symptoms:  {BHH MANIC SYMPTOMS:22872} Anxiety Symptoms:  {BHH ANXIETY SYMPTOMS:22873} Psychotic Symptoms:  {BHH PSYCHOTIC SYMPTOMS:22874} PTSD Symptoms: {BHH PTSD SYMPTOMS:22875}  Past Psychiatric History:  Outpatient:  Psychiatry admission:  Previous suicide attempt:  Past trials of medication:  History of violence:    Previous Psychotropic Medications: {YES/NO:21197}  Substance Abuse History in the last 12 months:  {yes no:314532}  Consequences of Substance Abuse: {BHH CONSEQUENCES OF SUBSTANCE ABUSE:22880}  Past Medical History:  Past Medical History:  Diagnosis Date   Calculus of kidney    Chronic constipation    Dyslipidemia    GERD (gastroesophageal reflux disease)    Proteinuria    Schizo-affective schizophrenia (HCC)    Vitamin D deficiency     Past Surgical History:  Procedure Laterality Date   COLONOSCOPY  07/02/2012   CYSTOSCOPY W/ RETROGRADES Right 05/10/2019   Procedure: CYSTOSCOPY WITH RETROGRADE PYELOGRAM;  Surgeon: Sondra Come, MD;  Location: [facility];  Service: Urology;  Laterality: Right;   CYSTOSCOPY/URETEROSCOPY/HOLMIUM LASER/STENT PLACEMENT Right 05/10/2019   Procedure: CYSTOSCOPY/URETEROSCOPY/HOLMIUM LASER/STENT PLACEMENT;  Surgeon: Sondra Come, MD;  Location: [facility];  Service: Urology;  Laterality: Right;     Family Psychiatric History: ***  Family History:  Family History  Problem Relation Age of Onset   Parkinson's disease Mother    Deep vein thrombosis Father    Osteoarthritis Father     Social History:   Social History   Socioeconomic History   Marital status: Single    Spouse name: Not on file   Number of children: 0   Years of education: Not on file   Highest education level: High school graduate  Occupational History   Occupation: [employer]  Tobacco Use   Smoking status: Every Day    Packs/day: 1.00    Years: 40.00    Total pack years: 40.00    Types: Cigarettes   Smokeless tobacco: Never  Vaping Use   Vaping Use: Never used  Substance and Sexual Activity   Alcohol use: No    Alcohol/week: 0.0 standard drinks of alcohol   Drug use: No   Sexual activity: Never  Other Topics Concern   Not on file  Social History Narrative   He lives in a group home, but able to drive and works one day per week at [employer]   Financial Resource Strain: Low Risk  (04/13/2021)   Overall Financial Resource Strain (CARDIA)    Difficulty of Paying Living Expenses: Not very hard  Food Insecurity: No Food Insecurity (04/13/2021)   Hunger Vital Sign    Worried About Running Out of Food in the Last Year: Never true    Ran Out of Food in the Last Year: Never true  Transportation Needs: No Transportation Needs (04/13/2021)   PRAPARE - Transportation    Lack of Transportation (Medical): No    Lack  of Transportation (Non-Medical): No  Physical Activity: Inactive (04/13/2021)   Exercise Vital Sign    Days of Exercise per Week: 0 days    Minutes of Exercise per Session: 0 min  Stress: No Stress Concern Present (04/13/2021)   [employer] of Occupational Health - Occupational Stress Questionnaire    Feeling of Stress : Not at all  Social Connections: Socially Isolated (04/13/2021)   Social Connection and Isolation Panel [NHANES]    Frequency of Communication with  Friends and Family: More than three times a week    Frequency of Social Gatherings with Friends and Family: Twice a week    Attends Religious Services: Never    Database administrator or Organizations: No    Attends Banker Meetings: Never    Marital Status: Never married    Additional Social History: ***  Allergies:   Allergies  Allergen Reactions   Tall Ragweed Other (See Comments)    Pollen    Metabolic Disorder Labs: Lab Results  Component Value Date   HGBA1C 5.2 09/30/2020   MPG 103 09/30/2020   MPG 114 10/01/2019   No results found for: "PROLACTIN" Lab Results  Component Value Date   CHOL 169 09/30/2020   TRIG 133 09/30/2020   HDL 47 09/30/2020   CHOLHDL 3.6 09/30/2020   LDLCALC 99 09/30/2020   LDLCALC 113 (H) 10/01/2019   No results found for: "TSH"  Therapeutic Level Labs: No results found for: "LITHIUM" No results found for: "CBMZ" Lab Results  Component Value Date   VALPROATE 35 (L) 07/20/2021    Current Medications: Current Outpatient Medications  Medication Sig Dispense Refill   acetaminophen (TYLENOL) 500 MG tablet Take 1,000 mg by mouth every 4 (four) hours as needed for mild pain or headache.      albuterol (VENTOLIN HFA) 108 (90 Base) MCG/ACT inhaler Inhale 2 puffs into the lungs every 6 (six) hours as needed for wheezing or shortness of breath. 8 g 2   Cholecalciferol (VITAMIN D3) 50 MCG (2000 UT) capsule TAKE 1 CAPSULE BY MOUTH DAILY 30 capsule 12   cloZAPine (CLOZARIL) 100 MG tablet Take 2 tablets (200 mg total) by mouth 2 (two) times daily for 5 days. 20 tablet 0   divalproex (DEPAKOTE ER) 500 MG 24 hr tablet Take 500 mg by mouth at bedtime.      FLUoxetine (PROZAC) 40 MG capsule Take 40 mg by mouth daily.     INGREZZA 80 MG capsule Take 80 mg by mouth daily.     omeprazole (PRILOSEC) 40 MG capsule TAKE 1 CAPSULE BY MOUTH DAILY (Patient taking differently: Take 40 mg by mouth daily.) 30 capsule 5   polycarbophil (FIBERCON) 625 MG  tablet Take 625 mg by mouth daily.     simvastatin (ZOCOR) 20 MG tablet TAKE 1 TABLET BY MOUTH DAILY (Patient taking differently: Take 20 mg by mouth daily at 6 PM.) 30 tablet 5   umeclidinium-vilanterol (ANORO ELLIPTA) 62.5-25 MCG/ACT AEPB Inhale 1 puff into the lungs daily at 6 (six) AM. 1 each 5   No current facility-administered medications for this visit.    Musculoskeletal: Strength & Muscle Tone: within normal limits Gait & Station: normal Patient leans: N/A  Psychiatric Specialty Exam: Review of Systems  There were no vitals taken for this visit.There is no height or weight on file to calculate BMI.  General Appearance: {Appearance:22683}  Eye Contact:  {BHH EYE CONTACT:22684}  Speech:  Clear and Coherent  Volume:  Normal  Mood:  {  BHH XTKW:40973}  Affect:  {Affect (PAA):22687}  Thought Process:  Coherent  Orientation:  Full (Time, Place, and Person)  Thought Content:  Logical  Suicidal Thoughts:  {ST/HT (PAA):22692}  Homicidal Thoughts:  {ST/HT (PAA):22692}  Memory:  Immediate;   Good  Judgement:  {Judgement (PAA):22694}  Insight:  {Insight (PAA):22695}  Psychomotor Activity:  Normal  Concentration:  Concentration: Good and Attention Span: Good  Recall:  Good  Fund of Knowledge:Good  Language: Good  Akathisia:  No  Handed:  Right  AIMS (if indicated):  not done  Assets:  Communication Skills Desire for Improvement  ADL's:  Intact  Cognition: WNL  Sleep:  {BHH GOOD/FAIR/POOR:22877}   Screenings: GAD-7    Flowsheet Row Office Visit from 08/03/2021 in [facility] Office Visit from 02/02/2021 in [facility] Office Visit from 11/14/2017 in [facility] Office Visit from 10/04/2017 in [facility]  Total GAD-7 Score 8 0 2 3      PHQ2-9    Flowsheet Row Office Visit from 08/03/2021 in [facility] Office Visit from 05/12/2021 in [facility] Clinical  Support from 04/13/2021 in [facility] Office Visit from 02/02/2021 in [facility] Office Visit from 09/29/2020 in [facility]  PHQ-2 Total Score 2 0 2 0 0  PHQ-9 Total Score 5 -- 4 0 --      Flowsheet Row ED to Hosp-Admission (Discharged) from 07/20/2021 in [facility] 1C MEDICAL TELEMETRY ED to Hosp-Admission (Discharged) from 04/30/2021 in [facility]TH[facility] (1A)  C-SSRS RISK CATEGORY No Risk No Risk       Assessment and Plan:  Assessment  Plan   The patient demonstrates the following risk factors for suicide: Chronic risk factors for suicide include: {Chronic Risk Factors for ZHGDJME:26834196}. Acute risk factors for suicide include: {Acute Risk Factors for QIWLNLG:92119417}. Protective factors for this patient include: {Protective Factors for Suicide EYCX:44818563}. Considering these factors, the overall suicide risk at this point appears to be {Desc; low/moderate/high:110033}. Patient {ACTION; IS/IS JSH:70263785} appropriate for outpatient follow up.   Collaboration of Care: {BH OP Collaboration of Care:21014065}  Patient/Guardian was advised Release of Information must be obtained prior to any record release in order to collaborate their care with an outside provider. Patient/Guardian was advised if they have not already done so to contact the registration department to sign all necessary forms in order for [name] to release information regarding their care.   Consent: Patient/Guardian gives verbal consent for treatment and assignment of benefits for services provided during this visit. Patient/Guardian expressed understanding and agreed to proceed.   Neysa Hotter, MD 8/16/202311:55 AM

## 2021-08-26 ENCOUNTER — Emergency Department: Payer: Medicare Other

## 2021-08-26 ENCOUNTER — Ambulatory Visit: Payer: Medicare Other | Admitting: Psychiatry

## 2021-08-26 ENCOUNTER — Other Ambulatory Visit: Payer: Self-pay

## 2021-08-26 ENCOUNTER — Encounter: Payer: Self-pay | Admitting: Internal Medicine

## 2021-08-26 ENCOUNTER — Inpatient Hospital Stay
Admission: EM | Admit: 2021-08-26 | Discharge: 2021-09-02 | DRG: 070 | Disposition: A | Discharge status: Hospice | Payer: Medicare Other | Attending: Internal Medicine | Admitting: Internal Medicine

## 2021-08-26 DIAGNOSIS — F258 Other schizoaffective disorders: Secondary | ICD-10-CM | POA: Diagnosis present

## 2021-08-26 DIAGNOSIS — R4182 Altered mental status, unspecified: Principal | ICD-10-CM

## 2021-08-26 DIAGNOSIS — Z720 Tobacco use: Secondary | ICD-10-CM | POA: Diagnosis not present

## 2021-08-26 DIAGNOSIS — E871 Hypo-osmolality and hyponatremia: Secondary | ICD-10-CM | POA: Diagnosis present

## 2021-08-26 DIAGNOSIS — I10 Essential (primary) hypertension: Secondary | ICD-10-CM | POA: Diagnosis present

## 2021-08-26 DIAGNOSIS — G9081 Serotonin syndrome: Secondary | ICD-10-CM | POA: Diagnosis present

## 2021-08-26 DIAGNOSIS — Z9109 Other allergy status, other than to drugs and biological substances: Secondary | ICD-10-CM

## 2021-08-26 DIAGNOSIS — Z515 Encounter for palliative care: Secondary | ICD-10-CM

## 2021-08-26 DIAGNOSIS — E559 Vitamin D deficiency, unspecified: Secondary | ICD-10-CM | POA: Diagnosis present

## 2021-08-26 DIAGNOSIS — R296 Repeated falls: Secondary | ICD-10-CM | POA: Diagnosis present

## 2021-08-26 DIAGNOSIS — E876 Hypokalemia: Secondary | ICD-10-CM

## 2021-08-26 DIAGNOSIS — E870 Hyperosmolality and hypernatremia: Secondary | ICD-10-CM | POA: Diagnosis present

## 2021-08-26 DIAGNOSIS — Z79899 Other long term (current) drug therapy: Secondary | ICD-10-CM

## 2021-08-26 DIAGNOSIS — F17210 Nicotine dependence, cigarettes, uncomplicated: Secondary | ICD-10-CM | POA: Diagnosis present

## 2021-08-26 DIAGNOSIS — G2579 Other drug induced movement disorders: Secondary | ICD-10-CM

## 2021-08-26 DIAGNOSIS — Z820 Family history of epilepsy and other diseases of the nervous system: Secondary | ICD-10-CM

## 2021-08-26 DIAGNOSIS — J449 Chronic obstructive pulmonary disease, unspecified: Secondary | ICD-10-CM | POA: Diagnosis present

## 2021-08-26 DIAGNOSIS — M6282 Rhabdomyolysis: Secondary | ICD-10-CM | POA: Diagnosis present

## 2021-08-26 DIAGNOSIS — Z66 Do not resuscitate: Secondary | ICD-10-CM | POA: Diagnosis present

## 2021-08-26 DIAGNOSIS — E785 Hyperlipidemia, unspecified: Secondary | ICD-10-CM | POA: Diagnosis present

## 2021-08-26 DIAGNOSIS — Z681 Body mass index (BMI) 19 or less, adult: Secondary | ICD-10-CM | POA: Diagnosis not present

## 2021-08-26 DIAGNOSIS — R29898 Other symptoms and signs involving the musculoskeletal system: Secondary | ICD-10-CM | POA: Diagnosis not present

## 2021-08-26 DIAGNOSIS — G210 Malignant neuroleptic syndrome: Secondary | ICD-10-CM

## 2021-08-26 DIAGNOSIS — R54 Age-related physical debility: Secondary | ICD-10-CM | POA: Diagnosis present

## 2021-08-26 DIAGNOSIS — Z8261 Family history of arthritis: Secondary | ICD-10-CM

## 2021-08-26 DIAGNOSIS — R471 Dysarthria and anarthria: Secondary | ICD-10-CM | POA: Diagnosis present

## 2021-08-26 DIAGNOSIS — Z7189 Other specified counseling: Secondary | ICD-10-CM | POA: Diagnosis not present

## 2021-08-26 DIAGNOSIS — G9341 Metabolic encephalopathy: Principal | ICD-10-CM | POA: Diagnosis present

## 2021-08-26 DIAGNOSIS — R5381 Other malaise: Secondary | ICD-10-CM | POA: Diagnosis not present

## 2021-08-26 DIAGNOSIS — R627 Adult failure to thrive: Secondary | ICD-10-CM | POA: Diagnosis present

## 2021-08-26 DIAGNOSIS — R4701 Aphasia: Secondary | ICD-10-CM | POA: Diagnosis present

## 2021-08-26 DIAGNOSIS — K219 Gastro-esophageal reflux disease without esophagitis: Secondary | ICD-10-CM | POA: Diagnosis present

## 2021-08-26 DIAGNOSIS — E43 Unspecified severe protein-calorie malnutrition: Secondary | ICD-10-CM | POA: Diagnosis present

## 2021-08-26 DIAGNOSIS — R64 Cachexia: Secondary | ICD-10-CM | POA: Diagnosis present

## 2021-08-26 DIAGNOSIS — B1081 Human herpesvirus 6 infection: Secondary | ICD-10-CM | POA: Diagnosis present

## 2021-08-26 DIAGNOSIS — T796XXA Traumatic ischemia of muscle, initial encounter: Secondary | ICD-10-CM | POA: Diagnosis not present

## 2021-08-26 LAB — COMPREHENSIVE METABOLIC PANEL
ALT: 33 U/L (ref 0–44)
AST: 76 U/L — ABNORMAL HIGH (ref 15–41)
Albumin: 4.6 g/dL (ref 3.5–5.0)
Alkaline Phosphatase: 107 U/L (ref 38–126)
Anion gap: 13 (ref 5–15)
BUN: 44 mg/dL — ABNORMAL HIGH (ref 8–23)
CO2: 22 mmol/L (ref 22–32)
Calcium: 10.3 mg/dL (ref 8.9–10.3)
Chloride: 115 mmol/L — ABNORMAL HIGH (ref 98–111)
Creatinine, Ser: 1.23 mg/dL (ref 0.61–1.24)
GFR, Estimated: >60 mL/min (ref >60)
Glucose, Bld: 104 mg/dL — ABNORMAL HIGH (ref 70–99)
Potassium: 3.7 mmol/L (ref 3.5–5.1)
Sodium: 150 mmol/L — ABNORMAL HIGH (ref 135–145)
Total Bilirubin: 1.2 mg/dL (ref 0.3–1.2)
Total Protein: 7.8 g/dL (ref 6.5–8.1)

## 2021-08-26 LAB — MENINGITIS/ENCEPHALITIS PANEL (CSF)
Cryptococcus neoformans/gattii (CSF): NOT DETECTED
Cytomegalovirus (CSF): NOT DETECTED
Enterovirus (CSF): NOT DETECTED
Escherichia coli K1 (CSF): NOT DETECTED
Haemophilus influenzae (CSF): NOT DETECTED
Herpes simplex virus 1 (CSF): NOT DETECTED
Herpes simplex virus 2 (CSF): NOT DETECTED
Human herpesvirus 6 (CSF): DETECTED — AB
Human parechovirus (CSF): NOT DETECTED
Listeria monocytogenes (CSF): NOT DETECTED
Neisseria meningitis (CSF): NOT DETECTED
Streptococcus agalactiae (CSF): NOT DETECTED
Streptococcus pneumoniae (CSF): NOT DETECTED
Varicella zoster virus (CSF): NOT DETECTED

## 2021-08-26 LAB — CSF CELL COUNT WITH DIFFERENTIAL
Eosinophils, CSF: 0 % (ref 0–1)
Lymphs, CSF: 60 % (ref 40–80)
Monocyte-Macrophage-Spinal Fluid: 40 % (ref 15–45)
RBC Count, CSF: 9 /mm3 — ABNORMAL HIGH
Segmented Neutrophils-CSF: 0 % (ref 0–6)
Tube #: 3
WBC, CSF: 7 /mm3 — ABNORMAL HIGH (ref 0–5)

## 2021-08-26 LAB — CBC WITH DIFFERENTIAL/PLATELET
Abs Immature Granulocytes: 0.13 10*3/uL — ABNORMAL HIGH (ref 0.00–0.07)
Basophils Absolute: 0 10*3/uL (ref 0.0–0.1)
Basophils Relative: 0 %
Eosinophils Absolute: 0 10*3/uL (ref 0.0–0.5)
Eosinophils Relative: 0 %
HCT: 40.6 % (ref 39.0–52.0)
Hemoglobin: 13.1 g/dL (ref 13.0–17.0)
Immature Granulocytes: 1 %
Lymphocytes Relative: 3 %
Lymphs Abs: 0.6 10*3/uL — ABNORMAL LOW (ref 0.7–4.0)
MCH: 29 pg (ref 26.0–34.0)
MCHC: 32.3 g/dL (ref 30.0–36.0)
MCV: 89.8 fL (ref 80.0–100.0)
Monocytes Absolute: 1.7 10*3/uL — ABNORMAL HIGH (ref 0.1–1.0)
Monocytes Relative: 8 %
Neutro Abs: 17.5 10*3/uL — ABNORMAL HIGH (ref 1.7–7.7)
Neutrophils Relative %: 88 %
Platelets: 298 10*3/uL (ref 150–400)
RBC: 4.52 MIL/uL (ref 4.22–5.81)
RDW: 16 % — ABNORMAL HIGH (ref 11.5–15.5)
WBC: 19.9 10*3/uL — ABNORMAL HIGH (ref 4.0–10.5)
nRBC: 0 % (ref 0.0–0.2)

## 2021-08-26 LAB — PROCALCITONIN: Procalcitonin: <0.1 ng/mL

## 2021-08-26 LAB — ACETAMINOPHEN LEVEL: Acetaminophen (Tylenol), Serum: <10 ug/mL — ABNORMAL LOW (ref 10–30)

## 2021-08-26 LAB — URINALYSIS, COMPLETE (UACMP) WITH MICROSCOPIC
Bilirubin Urine: NEGATIVE
Glucose, UA: NEGATIVE mg/dL
Ketones, ur: 20 mg/dL — AB
Leukocytes,Ua: NEGATIVE
Nitrite: NEGATIVE
Protein, ur: 100 mg/dL — AB
Specific Gravity, Urine: 1.025 (ref 1.005–1.030)
pH: 5 (ref 5.0–8.0)

## 2021-08-26 LAB — URINE DRUG SCREEN, QUALITATIVE (ARMC ONLY)
Amphetamines, Ur Screen: NOT DETECTED
Barbiturates, Ur Screen: NOT DETECTED
Benzodiazepine, Ur Scrn: POSITIVE — AB
Cannabinoid 50 Ng, Ur ~~LOC~~: NOT DETECTED
Cocaine Metabolite,Ur ~~LOC~~: NOT DETECTED
MDMA (Ecstasy)Ur Screen: NOT DETECTED
Methadone Scn, Ur: NOT DETECTED
Opiate, Ur Screen: NOT DETECTED
Phencyclidine (PCP) Ur S: NOT DETECTED
Tricyclic, Ur Screen: NOT DETECTED

## 2021-08-26 LAB — MAGNESIUM: Magnesium: 2.3 mg/dL (ref 1.7–2.4)

## 2021-08-26 LAB — GLUCOSE, CAPILLARY: Glucose-Capillary: 110 mg/dL — ABNORMAL HIGH (ref 70–99)

## 2021-08-26 LAB — PROTIME-INR
INR: 1 (ref 0.8–1.2)
Prothrombin Time: 13.5 seconds (ref 11.4–15.2)

## 2021-08-26 LAB — LACTIC ACID, PLASMA
Lactic Acid, Venous: 1.7 mmol/L (ref 0.5–1.9)
Lactic Acid, Venous: 1 mmol/L (ref 0.5–1.9)

## 2021-08-26 LAB — PROTEIN AND GLUCOSE, CSF
Glucose, CSF: 70 mg/dL (ref 40–70)
Total  Protein, CSF: 57 mg/dL — ABNORMAL HIGH (ref 15–45)

## 2021-08-26 LAB — VALPROIC ACID LEVEL: Valproic Acid Lvl: 28 ug/mL — ABNORMAL LOW (ref 50.0–100.0)

## 2021-08-26 LAB — SALICYLATE LEVEL: Salicylate Lvl: <7 mg/dL — ABNORMAL LOW (ref 7.0–30.0)

## 2021-08-26 LAB — CK: Total CK: 4006 U/L — ABNORMAL HIGH (ref 49–397)

## 2021-08-26 LAB — APTT: aPTT: 37 seconds — ABNORMAL HIGH (ref 24–36)

## 2021-08-26 LAB — MRSA NEXT GEN BY PCR, NASAL: MRSA by PCR Next Gen: NOT DETECTED

## 2021-08-26 LAB — AMMONIA: Ammonia: 37 umol/L — ABNORMAL HIGH (ref 9–35)

## 2021-08-26 MED ORDER — CYPROHEPTADINE HCL 4 MG PO TABS
12.0000 mg | ORAL_TABLET | Freq: Once | ORAL | Status: DC
  Filled 2021-08-26: qty 3

## 2021-08-26 MED ORDER — LACTATED RINGERS IV BOLUS
1000.0000 mL | Freq: Once | INTRAVENOUS | Status: AC
  Administered 2021-08-26: 1000 mL via INTRAVENOUS

## 2021-08-26 MED ORDER — CYPROHEPTADINE HCL 4 MG PO TABS
2.0000 mg | ORAL_TABLET | ORAL | Status: DC
Start: 2021-08-26 — End: 2021-08-26
  Filled 2021-08-26 (×4): qty 1

## 2021-08-26 MED ORDER — DEXTROSE-NACL 5-0.45 % IV SOLN
INTRAVENOUS | Status: DC
  Administered 2021-08-27: 1000 mL via INTRAVENOUS

## 2021-08-26 MED ORDER — LORAZEPAM 2 MG/ML IJ SOLN
2.0000 mg | INTRAMUSCULAR | Status: DC | PRN
  Administered 2021-08-26 – 2021-08-28 (×2): 2 mg via INTRAVENOUS
  Filled 2021-08-26 (×2): qty 1

## 2021-08-26 MED ORDER — ORAL CARE MOUTH RINSE: 15.0000 mL | OROMUCOSAL | Status: DC | PRN

## 2021-08-26 MED ORDER — SODIUM CHLORIDE 0.9 % IV SOLN
2.0000 g | Freq: Once | INTRAVENOUS | Status: AC
  Administered 2021-08-26: 2 g via INTRAVENOUS
  Filled 2021-08-26: qty 20

## 2021-08-26 MED ORDER — IPRATROPIUM-ALBUTEROL 0.5-2.5 (3) MG/3ML IN SOLN: 3.0000 mL | Freq: Four times a day (QID) | RESPIRATORY_TRACT | Status: DC | PRN

## 2021-08-26 MED ORDER — ENOXAPARIN SODIUM 40 MG/0.4ML IJ SOSY
40.0000 mg | PREFILLED_SYRINGE | INTRAMUSCULAR | Status: DC
  Administered 2021-08-26 – 2021-08-31 (×6): 40 mg via SUBCUTANEOUS
  Filled 2021-08-26 (×6): qty 0.4

## 2021-08-26 MED ORDER — LACTATED RINGERS IV SOLN: INTRAVENOUS | Status: DC

## 2021-08-26 MED ORDER — LORAZEPAM 2 MG/ML IJ SOLN: 2.0000 mg | Freq: Four times a day (QID) | INTRAMUSCULAR | Status: DC | PRN

## 2021-08-26 MED ORDER — CYPROHEPTADINE HCL 4 MG PO TABS
2.0000 mg | ORAL_TABLET | ORAL | Status: DC
  Filled 2021-08-26 (×3): qty 1

## 2021-08-26 MED ORDER — SODIUM CHLORIDE 0.9 % IV SOLN
2.0000 g | Freq: Once | INTRAVENOUS | Status: AC
  Administered 2021-08-26: 2 g via INTRAVENOUS
  Filled 2021-08-26: qty 2000

## 2021-08-26 MED ORDER — ONDANSETRON HCL 4 MG PO TABS: 4.0000 mg | ORAL_TABLET | Freq: Four times a day (QID) | ORAL | Status: DC | PRN

## 2021-08-26 MED ORDER — CHLORHEXIDINE GLUCONATE CLOTH 2 % EX PADS
6.0000 | MEDICATED_PAD | Freq: Every day | CUTANEOUS | Status: DC
  Administered 2021-08-26 – 2021-08-31 (×6): 6 via TOPICAL

## 2021-08-26 MED ORDER — VANCOMYCIN HCL 1500 MG/300ML IV SOLN
1500.0000 mg | Freq: Once | INTRAVENOUS | Status: AC
  Administered 2021-08-26: 1500 mg via INTRAVENOUS
  Filled 2021-08-26: qty 300

## 2021-08-26 MED ORDER — ONDANSETRON HCL 4 MG/2ML IJ SOLN: 4.0000 mg | Freq: Four times a day (QID) | INTRAMUSCULAR | Status: DC | PRN

## 2021-08-26 MED ORDER — MIDAZOLAM HCL 2 MG/2ML IJ SOLN
2.0000 mg | Freq: Once | INTRAMUSCULAR | Status: AC
  Administered 2021-08-26: 2 mg via INTRAVENOUS
  Filled 2021-08-26: qty 2

## 2021-08-26 NOTE — Assessment & Plan Note (Addendum)
Hold all antipsychotic agents for now.  Seen by psychiatry.  Currently on as needed Ativan 

## 2021-08-26 NOTE — ED Notes (Addendum)
NG tube insertion attempted by this RN and Tresa Endo, RN. Tube would not extend pass the back of pt's bilateral nares. Hospitalist made aware.

## 2021-08-26 NOTE — Progress Notes (Signed)
Notified by pharmacist that patient's CSF fluid yielded human herpes virus 6. Discussed with infectious disease physician, who does not recommend any treatment at this time since patient does not have a known history of HIV/cancer/or receiving chemotherapy. She will see patient in consultation

## 2021-08-26 NOTE — Assessment & Plan Note (Addendum)
Continue aggressive IV fluid hydration.  He has had multiple falls which could be the etiology Hold statins Check daily CK levels.  His CK is still trending up which is worrisome [phone]

## 2021-08-26 NOTE — ED Triage Notes (Signed)
Pt here via [facility] from [facility] with weakness and 3 unwitnessed fall today. Pt verbal at times, will not respond to questions about pain.   74 95% RA 134/63 99-cbg 97.0

## 2021-08-26 NOTE — Assessment & Plan Note (Deleted)
Patient presents to the ER for evaluation of altered mental status and is noted to have generalized tremors, lower extremity clonus, leukocytosis, rhabdomyolysis. Patient had a spinal tap done in the emergency room to rule out meningitis and spinal fluid is clear, Gram stain is negative, normal CSF glucose and protein.  Patient received empiric antibiotic therapy with ampicillin, vancomycin and Rocephin.  Procalcitonin level is less than 0.10. Supportive care with IV fluid hydration Lorazepam as needed for agitation Start patient on cyproheptadine Admit patient to stepdown for close monitoring We will consult psychiatry and critical care

## 2021-08-26 NOTE — Progress Notes (Signed)
PHARMACY - PHYSICIAN COMMUNICATION  CRITICAL VALUE ALERT - Meningitis / Encephalitis Panel  Christopher Valdez. is an 61 y.o. male who presented to [facility] on 08/26/2021 with a chief complaint of weakness s/p fall.  Assessment: Lumbar puncture performed for concerns of meningitis / encephalitis.   Name of physician (or Provider) Contacted: Tochukwu Agbata, Levon Hedger  Current anti-infectives: Vanc, Ampicillin, Rocephin x 1 dose each Changes to prescribed anti-infectives recommended:   Plan: continue supportive care, no further anti-infectives to be ordered. ID will be consulted.  Results for orders placed or performed during the hospital encounter of 08/26/21  Meningitis/Encephalitis Panel (CSF) (Collected: 08/26/2021  9:31 AM)  Result Value Ref Range   Cryptococcus neoformans/gattii (CSF) NOT DETECTED NOT DETECTED   Cytomegalovirus (CSF) NOT DETECTED NOT DETECTED   Enterovirus (CSF) NOT DETECTED NOT DETECTED   Escherichia coli K1 (CSF) NOT DETECTED NOT DETECTED   Haemophilus influenzae (CSF) NOT DETECTED NOT DETECTED   Herpes simplex virus 1 (CSF) NOT DETECTED NOT DETECTED   Herpes simplex virus 2 (CSF) NOT DETECTED NOT DETECTED   Human herpesvirus 6 (CSF) DETECTED (A) NOT DETECTED   Human parechovirus (CSF) NOT DETECTED NOT DETECTED   Listeria monocytogenes (CSF) NOT DETECTED NOT DETECTED   Neisseria meningitis (CSF) NOT DETECTED NOT DETECTED   Streptococcus agalactiae (CSF) NOT DETECTED NOT DETECTED   Streptococcus pneumoniae (CSF) NOT DETECTED NOT DETECTED   Varicella zoster virus (CSF) NOT DETECTED NOT DETECTED    Christopher Valdez A Charman Blasco 08/26/2021  5:20 PM

## 2021-08-26 NOTE — Progress Notes (Signed)
08/26/2021  Sister now here. States patient has had severe FTT over past several months.  Weight has dropped over 40 lbs unintentionally.  Review of his records is suspicious for chronic aspiration syndrome leading to unintentional food aversion.  In light of progressive decline in functional status and increasing quality of life, we discussed what Christopher Valdez would have wanted should he continue to decline.  She thinks he would not want chest compressions, shocks, or mechanical ventilation which is reasonable in present circumstances.  Continue supportive care, EEG, have SLP take another look at him.  Low threshold to involve palliative care if languishes.  Myrla Halsted MD PCCM

## 2021-08-26 NOTE — Progress Notes (Signed)
Eeg done 

## 2021-08-26 NOTE — Assessment & Plan Note (Addendum)
Not acutely exacerbated  as needed bronchodilator therapy. 

## 2021-08-26 NOTE — ED Provider Notes (Signed)
[facility] Provider Note    None    (approximate)   History   Weakness and Fall   HPI  Christopher Valdez. is a 61 y.o. male with past medical history schizoaffective disorder/schizophrenia, GERD, kidney stones, malnutrition presents with altered mental status.  Per EMS report patient had 3 falls out of bed last night.  Patient resides in a group home has been off his clozapine for several weeks.  He threw water on himself and urinated on himself.  Typically he has some shuffling gait and difficulty speaking but is now more altered than baseline.  Unable to obtain any history from patient due to decreased verbal responsiveness and altered mental status.  Review last admission about 1 month ago seems at that time patient was able to provide history he was admitted for aspiration pneumonia.       Past Medical History:  Diagnosis Date   Calculus of kidney    Chronic constipation    Dyslipidemia    GERD (gastroesophageal reflux disease)    Proteinuria    Schizo-affective schizophrenia (HCC)    Vitamin D deficiency     Patient Active Problem List   Diagnosis Date Noted   Atherosclerosis of aorta (HCC) 08/03/2021   Moderate protein-calorie malnutrition (HCC) 08/03/2021   Other emphysema (HCC) 08/03/2021   Anemia 07/21/2021   Elevated troponin 07/21/2021   COPD exacerbation (HCC) 07/21/2021   Pressure injury of skin 05/02/2021   History of aspiration pneumonia 05/01/2021   Essential hypertension 02/17/2020   Other disorders of kidney and ureter in diseases classified elsewhere 02/17/2020   Hydronephrosis of right kidney 04/03/2018   Right kidney stone 04/03/2018   Avitaminosis D 10/29/2014   Enlarged prostate 10/29/2014   Other schizoaffective disorders (HCC) 10/10/2014   Proteinuria 10/10/2014   Dyslipidemia 10/10/2014   History of kidney stones 10/10/2014   External hemorrhoid 10/10/2014   Chronic constipation 10/10/2014   Tobacco use  10/10/2014   GERD without esophagitis 10/10/2014     Physical Exam  Triage Vital Signs: ED Triage Vitals  Enc Vitals Group     BP      Pulse      Resp      Temp      Temp src      SpO2      Weight      Height      Head Circumference      Peak Flow      Pain Score      Pain Loc      Pain Edu?      Excl. in GC?     Most recent vital signs: Vitals:   08/26/21 0830 08/26/21 0900  BP: (!) 154/66 (!) 145/65  Pulse: 89 91  Resp: (!) 27 (!) 27  Temp:    SpO2: 98% 99%     General: Awake, patient is unwell appearing tremulous CV:  Good peripheral perfusion.  No peripheral edema Resp:  Mildly tachypneic Abd:  No distention.  Abdomen is soft and nontender Neuro:             Patient's eyes are open pupils are equal round reactive he does follow commands such as closing his eyes and moving his toes, he is tremulous in the bilateral upper and lower extremities, there is clonus in the bilateral lower extremities no leadpipe rigidity patient with minimal verbal response but is asking for water really does not answer yes or no to questions or  provide history Other:     ED Results / Procedures / Treatments  Labs (all labs ordered are listed, but only abnormal results are displayed) Labs Reviewed  COMPREHENSIVE METABOLIC PANEL - Abnormal; Notable for the following components:      Result Value   Sodium 150 (*)    Chloride 115 (*)    Glucose, Bld 104 (*)    BUN 44 (*)    AST 76 (*)    All other components within normal limits  CBC WITH DIFFERENTIAL/PLATELET - Abnormal; Notable for the following components:   WBC 19.9 (*)    RDW 16.0 (*)    Neutro Abs 17.5 (*)    Lymphs Abs 0.6 (*)    Monocytes Absolute 1.7 (*)    Abs Immature Granulocytes 0.13 (*)    All other components within normal limits  CK - Abnormal; Notable for the following components:   Total CK 4,006 (*)    All other components within normal limits  AMMONIA - Abnormal; Notable for the following components:    Ammonia 37 (*)    All other components within normal limits  VALPROIC ACID LEVEL - Abnormal; Notable for the following components:   Valproic Acid Lvl 28 (*)    All other components within normal limits  ACETAMINOPHEN LEVEL - Abnormal; Notable for the following components:   Acetaminophen (Tylenol), Serum <10 (*)    All other components within normal limits  SALICYLATE LEVEL - Abnormal; Notable for the following components:   Salicylate Lvl <7.0 (*)    All other components within normal limits  APTT - Abnormal; Notable for the following components:   aPTT 37 (*)    All other components within normal limits  CULTURE, BLOOD (ROUTINE X 2)  CULTURE, BLOOD (ROUTINE X 2)  CSF CULTURE W GRAM STAIN  MAGNESIUM  PROTIME-INR  URINALYSIS, ROUTINE W REFLEX MICROSCOPIC  URINE DRUG SCREEN, QUALITATIVE ([facility] ONLY)  LACTIC ACID, PLASMA  LACTIC ACID, PLASMA  PROCALCITONIN  CSF CELL COUNT WITH DIFFERENTIAL  PROTEIN AND GLUCOSE, CSF  MENINGITIS/ENCEPHALITIS PANEL (CSF)     EKG  EKG interpretation performed by myself: NSR, nml axis, nml intervals, no acute ischemic changes    RADIOLOGY I reviewed and interpreted the CT scan of the brain which does not show any acute intracranial process  I reviewed and interpreted the CXR which does not show any acute cardiopulmonary process    PROCEDURES:  Critical Care performed: Yes, see critical care procedure note(s)  .1-3 Lead EKG Interpretation  Performed by: Georga Hacking, MD Authorized by: Georga Hacking, MD     Interpretation: normal     ECG rate assessment: normal     Rhythm: sinus rhythm     Ectopy: none     Conduction: normal   .Critical Care  Performed by: Georga Hacking, MD Authorized by: Georga Hacking, MD   Critical care provider statement:    Critical care time (minutes):  30   Critical care was time spent personally by me on the following activities:  Development of treatment plan with patient or surrogate,  discussions with consultants, evaluation of patient's response to treatment, examination of patient, ordering and review of laboratory studies, ordering and review of radiographic studies, ordering and performing treatments and interventions, pulse oximetry, re-evaluation of patient's condition and review of old charts .Lumbar Puncture  Date/Time: 08/26/2021 9:50 AM  Performed by: Georga Hacking, MD Authorized by: Georga Hacking, MD   Consent:    Consent  obtained:  Verbal   Risks discussed:  Infection, pain and bleeding Universal protocol:    Patient identity confirmed:  Verbally with patient Pre-procedure details:    Procedure purpose:  Diagnostic   Preparation: Patient was prepped and draped in usual sterile fashion   Sedation:    Sedation type:  Anxiolysis Anesthesia:    Anesthesia method:  Local infiltration   Local anesthetic:  Lidocaine 1% w/o epi Procedure details:    Lumbar space:  L4-L5 interspace   Patient position:  L lateral decubitus   Needle gauge:  22   Needle type:  Spinal needle - Quincke tip   Needle length (in):  2.5   Ultrasound guidance: no     Number of attempts:  1   Fluid appearance:  Clear   Tubes of fluid:  4   Total volume (ml):  4 Post-procedure details:    Puncture site:  Adhesive bandage applied   Procedure completion:  Tolerated   The patient is on the cardiac monitor to evaluate for evidence of arrhythmia and/or significant heart rate changes.   MEDICATIONS ORDERED IN ED: Medications  cefTRIAXone (ROCEPHIN) 2 g in sodium chloride 0.9 % 100 mL IVPB (2 g Intravenous New Bag/Given 08/26/21 0944)  vancomycin (VANCOREADY) IVPB 1500 mg/300 mL (has no administration in time range)  ampicillin (OMNIPEN) 2 g in sodium chloride 0.9 % 100 mL IVPB (has no administration in time range)  lactated ringers bolus 1,000 mL (0 mLs Intravenous Stopped 08/26/21 0906)  lactated ringers bolus 1,000 mL (1,000 mLs Intravenous New Bag/Given 08/26/21 0904)   midazolam (VERSED) injection 2 mg (2 mg Intravenous Given 08/26/21 0904)     IMPRESSION / MDM / ASSESSMENT AND PLAN / ED COURSE  I reviewed the triage vital signs and the nursing notes.                              Patient's presentation is most consistent with acute presentation with potential threat to life or bodily function.  Differential diagnosis includes, but is not limited to, intracranial hemorrhage, acute CVA, meningitis/encephalitis, side effect of antipsychotic withdrawal such as worsening schizoaffective disorder/schizophrenia, serotonin syndrome, neuroleptic malignant syndrome, hyponatremia, renal failure, electrolyte abnormality  This patient is a 61 year old male with underlying psychiatric illness presenting with altered mental status from his group home.  Unclear exactly what his baseline is but per last hospitalization notes patient is at least able to provide some history.  Apparently he had several falls out of bed and urinated on himself.  Patient looks unwell he is saying he is tremulous he is eyes are open and he attempts to tell me his name mouth is extremely dry and he has decreased opening of his mouth.  He has clonus in the lower extremities but is able to follow some commands exam appears nonfocal he is not rigid.  Differential remains broad.  Given the falls and altered mental status will obtain a CT head we will get checked rainbow of labs including CK ammonia level and Depakote level.  He is on serotonergic agents such as Prozac, will keep serotonin syndrome in the back of my mind given the lower extremity clonus and tremulousness.  I anticipate patient will need admission.  Labs are notable for a leukocytosis to 19.  CPK 4000.  He is also hyponatremic with sodium of 150.  CT head is without acute abnormality.  On repeat examination patient's neck is stiff along with the  rest of his body.  Given leukocytosis altered mental status with no clear explanation currently I  did perform a lumbar puncture to further evaluate for meningitis/encephalitis and have started empiric coverage for bacterial meningitis visit.  Lumbar puncture was uncomplicated with clear CSF draining.  He received 2 of Versed for the procedure and his muscles did relax some.  Patient will require admission.       FINAL CLINICAL IMPRESSION(S) / ED DIAGNOSES   Final diagnoses:  Altered mental status, unspecified altered mental status type     Rx / DC Orders   ED Discharge Orders     None        Note:  This document was prepared using Dragon voice recognition software and may include unintentional dictation errors.   Georga Hacking, MD 08/26/21 [phone]

## 2021-08-26 NOTE — H&P (Signed)
History and Physical    Patient: Christopher Valdez. GYJ:[id number] DOB: [birth year] DOA: 08/26/2021 DOS: the patient was seen and examined on 08/26/2021 PCP: Alba Cory, MD  Patient coming from: Home  Chief Complaint:  Chief Complaint  Patient presents with   Weakness   Fall   Patient unable to provide any history and most of the history was obtained from the EMR. HPI: Christopher Valdez. is a 61 y.o. male with medical history significant for schizoaffective disorder, GERD, nephrolithiasis who was brought into the ER by EMS from the group home where he resides for evaluation of weakness, mental status changes and multiple falls. Per EMR patient has been off his clozapine for several weeks and at baseline usually has a shuffling gait with difficulty speaking.  On the day of admission they noticed he was more lethargic and less responsive and had fallen out several times from his bed. Chart review shows that he was recently hospitalized from 07/11 - 07/13 for suspected aspiration pneumonia.  He was seen by speech therapy at the time and was placed on a dysphagia 3 diet. I am unable to do a review of systems on this patient due to his mental status. Patient noted to have generalized tremors and bilateral lower extremity clonus.  He is lethargic and is unable to answer questions appropriately.  Also noted to have significant rigidity. The ER physician did an LP on the patient due to his altered mental status, rigidity and leukocytosis to rule out meningitis and obtained clear spinal fluid.  He received empiric IV antibiotic therapy with vancomycin, Rocephin and ampicillin. Labs reveal hypernatremia and rhabdomyolysis. Patient will be admitted to the stepdown unit due to concerns for possible serotonin syndrome.   Review of Systems: As mentioned in the history of present illness. All other systems reviewed and are negative. Past Medical History:  Diagnosis Date   Calculus of kidney    Chronic  constipation    Dyslipidemia    GERD (gastroesophageal reflux disease)    Proteinuria    Schizo-affective schizophrenia (HCC)    Vitamin D deficiency    Past Surgical History:  Procedure Laterality Date   COLONOSCOPY  07/02/2012   CYSTOSCOPY W/ RETROGRADES Right 05/10/2019   Procedure: CYSTOSCOPY WITH RETROGRADE PYELOGRAM;  Surgeon: Sondra Come, MD;  Location: [facility] ORS;  Service: Urology;  Laterality: Right;   CYSTOSCOPY/URETEROSCOPY/HOLMIUM LASER/STENT PLACEMENT Right 05/10/2019   Procedure: CYSTOSCOPY/URETEROSCOPY/HOLMIUM LASER/STENT PLACEMENT;  Surgeon: Sondra Come, MD;  Location: [facility] ORS;  Service: Urology;  Laterality: Right;   Social History:  reports that he has been smoking cigarettes. He has a 40.00 pack-year smoking history. He has never used smokeless tobacco. He reports that he does not drink alcohol and does not use drugs.  Allergies  Allergen Reactions   Tall Ragweed Other (See Comments)    Pollen    Family History  Problem Relation Age of Onset   Parkinson's disease Mother    Deep vein thrombosis Father    Osteoarthritis Father     Prior to Admission medications   Medication Sig Start Date End Date Taking? Authorizing Provider  acetaminophen (TYLENOL) 500 MG tablet Take 1,000 mg by mouth every 4 (four) hours as needed for mild pain or headache.     [provider]  albuterol (VENTOLIN HFA) 108 (90 Base) MCG/ACT inhaler Inhale 2 puffs into the lungs every 6 (six) hours as needed for wheezing or shortness of breath. 05/02/21   Dimple Nanas, MD  Cholecalciferol (VITAMIN D3) 50 MCG (2000 UT) capsule TAKE 1 CAPSULE BY MOUTH DAILY 08/09/21   Alba Cory, MD  cloZAPine (CLOZARIL) 100 MG tablet Take 2 tablets (200 mg total) by mouth 2 (two) times daily for 5 days. 07/22/21 07/27/21  Tresa Moore, MD  divalproex (DEPAKOTE ER) 500 MG 24 hr tablet Take 500 mg by mouth at bedtime.  04/16/19   [provider]  FLUoxetine (PROZAC) 40 MG  capsule Take 40 mg by mouth daily.    [provider]  INGREZZA 80 MG capsule Take 80 mg by mouth daily. 01/26/21   [provider]  omeprazole (PRILOSEC) 40 MG capsule TAKE 1 CAPSULE BY MOUTH DAILY Patient taking differently: Take 40 mg by mouth daily. 05/18/21   Alba Cory, MD  polycarbophil (FIBERCON) 625 MG tablet Take 625 mg by mouth daily.    [provider]  simvastatin (ZOCOR) 20 MG tablet TAKE 1 TABLET BY MOUTH DAILY Patient taking differently: Take 20 mg by mouth daily at 6 PM. 03/02/21   Carlynn Purl, Danna Hefty, MD  umeclidinium-vilanterol [facility] ELLIPTA) 62.5-25 MCG/ACT AEPB Inhale 1 puff into the lungs daily at 6 (six) AM. 08/03/21   Alba Cory, MD    Physical Exam: Vitals:   08/26/21 0830 08/26/21 0900 08/26/21 1000 08/26/21 1221  BP: (!) 154/66 (!) 145/65 (!) 142/57   Pulse: 89 91 72   Resp: (!) 27 (!) 27 (!) 22   Temp:    99.4 F (37.4 C)  TempSrc:    Rectal  SpO2: 98% 99% 97%   Weight:      Height:       Physical Exam Vitals and nursing note reviewed.  Constitutional:      Comments: Thin, chronically ill-appearing, lethargic  HENT:     Nose: Nose normal.     Mouth/Throat:     Mouth: Mucous membranes are dry.  Cardiovascular:     Rate and Rhythm: Normal rate and regular rhythm.  Pulmonary:     Effort: Pulmonary effort is normal.     Breath sounds: Normal breath sounds.  Abdominal:     General: Abdomen is flat. Bowel sounds are normal.     Palpations: Abdomen is soft.  Musculoskeletal:     Comments: Generalized tremors, bilateral lower extremity clonus  Skin:    General: Skin is warm and dry.  Neurological:     Comments: Unable to assess  Psychiatric:     Comments: Unable to assess     Data Reviewed: Relevant notes from primary care and specialist visits, past discharge summaries as available in EHR, including Care Everywhere. Prior diagnostic testing as pertinent to current admission diagnoses Updated medications and problem  lists for reconciliation ED course, including vitals, labs, imaging, treatment and response to treatment Triage notes, nursing and pharmacy notes and ED provider's notes Notable results as noted in HPI Labs reviewed.  Lactic acid 1.0, procalcitonin less than 0.10, glucose CSF 70, total protein 57, sodium 150, potassium 3.7, chloride 115, bicarb 22, glucose 104, BUN 44, creatinine 1.23, calcium 10.3, total protein 7.8, albumin 4.6, AST 76, ALT 33, alkaline phosphatase 107, total bilirubin 1.2, total CK 4006, magnesium 2.3, valproic acid 28, ammonia level 37, white count 19.9, hemoglobin 13.1, hematocrit 40.6, platelet count 298 CSF Gram stain shows no organisms seen Chest x-ray reviewed by me shows No acute cardiopulmonary process. CT scan of the head without contrast shows No acute intracranial abnormality seen. Twelve-lead EKG reviewed by me shows sinus rhythm There are no  new results to review at this time.  Assessment and Plan: * Serotonin syndrome Patient presents to the ER for evaluation of altered mental status and is noted to have generalized tremors, lower extremity clonus, leukocytosis, rhabdomyolysis. Patient had a spinal tap done in the emergency room to rule out meningitis and spinal fluid is clear, Gram stain is negative, normal CSF glucose and protein.  Patient received empiric antibiotic therapy with ampicillin, vancomycin and Rocephin.  Procalcitonin level is less than 0.10. Supportive care with IV fluid hydration Lorazepam as needed for agitation Start patient on cyproheptadine Admit patient to stepdown for close monitoring We will consult psychiatry and critical care  Rhabdomyolysis Aggressive IV fluid hydration Hold statins Check daily CK levels  Hypernatremia We will place patient on dextrose containing infusion to administer free water and will also give free water through NG tube Repeat sodium levels in a.m.  COPD (chronic obstructive pulmonary disease) (HCC) Not  acutely exacerbated Place patient on as needed bronchodilator therapy  Tobacco use Discuss smoking cessation with patient once his mental status improves  Other schizoaffective disorders ([place]) Hold all antipsychotic agents for now      Advance Care Planning:   Code Status: Full Code   Consults: Psychiatry, critical care  Family Communication: Greater than 50% of time was spent discussing patient's condition and plan of care with his sister Christopher Valdez [phone] over the phone.  All questions and concerns have been addressed.  CODE STATUS was discussed and patient is a full code.  Severity of Illness: The appropriate patient status for this patient is INPATIENT. Inpatient status is judged to be reasonable and necessary in order to provide the required intensity of service to ensure the patient's safety. The patient's presenting symptoms, physical exam findings, and initial radiographic and laboratory data in the context of their chronic comorbidities is felt to place them at high risk for further clinical deterioration. Furthermore, it is not anticipated that the patient will be medically stable for discharge from the hospital within 2 midnights of admission.   * I certify that at the point of admission it is my clinical judgment that the patient will require inpatient hospital care spanning beyond 2 midnights from the point of admission due to high intensity of service, high risk for further deterioration and high frequency of surveillance required.*  Author: Collier Bullock, MD 08/26/2021 12:22 PM  For on call review [email or web address].

## 2021-08-26 NOTE — Progress Notes (Addendum)
[id number] admitted from ED via stretcher. Attempts to talk but mouth very dry. Mouth care done. Patient sleepy but assisted with taking off shirt. Pants and underwear soaked in urine. CHG bath done. Cradle cap noted on scalp. Slowly followed simple commands. Swaddled in blankets. Dosing at intervals. EEG done per orders. Noted to have cogwheel motion of upper limbs. See admission assessment.

## 2021-08-26 NOTE — Assessment & Plan Note (Addendum)
Counseled.  Nicotine patch ordered

## 2021-08-26 NOTE — Consult Note (Signed)
[facility] Face-to-Face Psychiatry Consult   Reason for Consult: Consult for this 61 year old man with a history of schizoaffective disorder who presents to the hospital with falls and altered mental status with concerns raised about possible serotonin syndrome Referring Physician:  Agbata Patient Identification: Christopher Valdez. MRN:  [id number] Principal Diagnosis: Serotonin syndrome Diagnosis:  Principal Problem:   Serotonin syndrome Active Problems:   Other schizoaffective disorders (HCC)   Tobacco use   Hypernatremia   Rhabdomyolysis   Total Time spent with patient: 45 minutes  Subjective:   Christopher Valdez. is a 61 y.o. male patient admitted with patient is currently unable to speak to give any history.Marland Kitchen  HPI: Patient seen and chart reviewed.  Case reviewed with hospitalist.  61 year old man unfamiliar to me with a history of schizoaffective disorder brought from his group home with a report of several days of change in mental status with unsteadiness on his feet and multiple falls.  Other available history is a report that he had been off of his clozapine for some time recently.  Not sure if he had been completely off it or it might have restarted it.  Unclear whether he has still been on all of his other medicine.  So far labs show him to be hypernatremic with signs of dehydration, mild to moderately elevated blood pressure other vitals currently read as normal.  Elevated CK at 4000.  Patient appeared to be awake when I came into the room.  Eyes were open and he did make eye contact.  I ask him multiple questions to see if he could speak.  He ultimately was able to reply affirmatively that he was able to hear me but that was the only thing that he said, could not even state his name.  Could not shake his head or nod his head to answer questions.  Patient appears to be tremulous all over.  He was able to move his hands very slowly but with limited purposeful movement.  I examined his arm and  found him to be very stiff at the elbow and shoulder.  It was possible to move the joints but there was a lot of resistance possibly some cogwheeling.  Examination of the tremor in his lower extremity suggest that there is some clonic worsening with movement in the ankle.  Past Psychiatric History: Patient has a history of schizoaffective disorder it appears that he has been maintained on clozapine and Depakote as most notable agents.  Current med list also includes Ingrazza and Prozac I am not sure how long those have been prescribed but it has been at least a couple years.  Nothing I can see in the notes to suggest that any medicines have been started or stopped other than the above mentioned history of recently not being on clozapine.  I do not see anything about past history of seizures.  There is no noted history of previous psychiatric hospitalizations.  Reports from his family practice doctor suggest that even as recently as earlier this year the patient was working a job 1 day a week and able to drive by himself.  Risk to Self:   Risk to Others:   Prior Inpatient Therapy:   Prior Outpatient Therapy:    Past Medical History:  Past Medical History:  Diagnosis Date   Calculus of kidney    Chronic constipation    Dyslipidemia    GERD (gastroesophageal reflux disease)    Proteinuria    Schizo-affective schizophrenia (HCC)  Vitamin D deficiency     Past Surgical History:  Procedure Laterality Date   COLONOSCOPY  07/02/2012   CYSTOSCOPY W/ RETROGRADES Right 05/10/2019   Procedure: CYSTOSCOPY WITH RETROGRADE PYELOGRAM;  Surgeon: Sondra ComeSninsky, Brian C, MD;  Location: [facility] ORS;  Service: Urology;  Laterality: Right;   CYSTOSCOPY/URETEROSCOPY/HOLMIUM LASER/STENT PLACEMENT Right 05/10/2019   Procedure: CYSTOSCOPY/URETEROSCOPY/HOLMIUM LASER/STENT PLACEMENT;  Surgeon: Sondra ComeSninsky, Brian C, MD;  Location: [facility] OR[facility];  Service: Urology;  Laterality: Right;   Family History:  Family History  Problem  Relation Age of Onset   Parkinson's disease Mother    Deep vein thrombosis Father    Osteoarthritis Father    Family Psychiatric  History: No information Social History:  Social History   Substance and Sexual Activity  Alcohol Use No   Alcohol/week: 0.0 standard drinks of alcohol     Social History   Substance and Sexual Activity  Drug Use No    Social History   Socioeconomic History   Marital status: Single    Spouse name: Not on file   Number of children: 0   Years of education: Not on file   Highest education level: High school graduate  Occupational History   Occupation: [employer]  Tobacco Use   Smoking status: Every Day    Packs/day: 1.00    Years: 40.00    Total pack years: 40.00    Types: Cigarettes   Smokeless tobacco: Never  Vaping Use   Vaping Use: Never used  Substance and Sexual Activity   Alcohol use: No    Alcohol/week: 0.0 standard drinks of alcohol   Drug use: No   Sexual activity: Never  Other Topics Concern   Not on file  Social History Narrative   He lives in a group home, but able to drive and works one day per week at [employer]   Social Determinants of Health   Financial Resource Strain: Low Risk  (04/13/2021)   Overall Financial Resource Strain (CARDIA)    Difficulty of Paying Living Expenses: Not very hard  Food Insecurity: No Food Insecurity (04/13/2021)   Hunger Vital Sign    Worried About Running Out of Food in the Last Year: Never true    Ran Out of Food in the Last Year: Never true  Transportation Needs: No Transportation Needs (04/13/2021)   PRAPARE - Administrator, Civil ServiceTransportation    Lack of Transportation (Medical): No    Lack of Transportation (Non-Medical): No  Physical Activity: Inactive (04/13/2021)   Exercise Vital Sign    Days of Exercise per Week: 0 days    Minutes of Exercise per Session: 0 min  Stress: No Stress Concern Present (04/13/2021)   [employer] - Occupational Stress Questionnaire    Feeling of Stress : Not at  all  Social Connections: Socially Isolated (04/13/2021)   Social Connection and Isolation Panel [NHANES]    Frequency of Communication with Friends and Family: More than three times a week    Frequency of Social Gatherings with Friends and Family: Twice a week    Attends Religious Services: Never    Database administratorActive Member of Clubs or Organizations: No    Attends BankerClub or Organization Meetings: Never    Marital Status: Never married   Additional Social History:    Allergies:   Allergies  Allergen Reactions   Tall Ragweed Other (See Comments)    Pollen    Labs:  Results for orders placed or performed during the hospital encounter of 08/26/21 (from the past  48 hour(s))  Comprehensive metabolic panel     Status: Abnormal   Collection Time: 08/26/21  7:55 AM  Result Value Ref Range   Sodium 150 (H) 135 - 145 mmol/L   Potassium 3.7 3.5 - 5.1 mmol/L   Chloride 115 (H) 98 - 111 mmol/L   CO2 22 22 - 32 mmol/L   Glucose, Bld 104 (H) 70 - 99 mg/dL    Comment: Glucose reference range applies only to samples taken after fasting for at least 8 hours.   BUN 44 (H) 8 - 23 mg/dL   Creatinine, Ser 9.67 0.61 - 1.24 mg/dL   Calcium 89.3 8.9 - 81.0 mg/dL   Total Protein 7.8 6.5 - 8.1 g/dL   Albumin 4.6 3.5 - 5.0 g/dL   AST 76 (H) 15 - 41 U/L   ALT 33 0 - 44 U/L   Alkaline Phosphatase 107 38 - 126 U/L   Total Bilirubin 1.2 0.3 - 1.2 mg/dL   GFR, Estimated >17 >51 mL/min    Comment: (NOTE) Calculated using the CKD-EPI Creatinine Equation (2021)    Anion gap 13 5 - 15    Comment: Performed at [facility], [address] [place], [place] [address]  CBC with Differential     Status: Abnormal   Collection Time: 08/26/21  7:55 AM  Result Value Ref Range   WBC 19.9 (H) 4.0 - 10.5 K/uL   RBC 4.52 4.22 - 5.81 MIL/uL   Hemoglobin 13.1 13.0 - 17.0 g/dL   HCT 27.7 82.4 - 23.5 %   MCV 89.8 80.0 - 100.0 fL   MCH 29.0 26.0 - 34.0 pg   MCHC 32.3 30.0 - 36.0 g/dL   RDW 36.1 (H) 44.3 - 15.4 %    Platelets 298 150 - 400 K/uL   nRBC 0.0 0.0 - 0.2 %   Neutrophils Relative % 88 %   Neutro Abs 17.5 (H) 1.7 - 7.7 K/uL   Lymphocytes Relative 3 %   Lymphs Abs 0.6 (L) 0.7 - 4.0 K/uL   Monocytes Relative 8 %   Monocytes Absolute 1.7 (H) 0.1 - 1.0 K/uL   Eosinophils Relative 0 %   Eosinophils Absolute 0.0 0.0 - 0.5 K/uL   Basophils Relative 0 %   Basophils Absolute 0.0 0.0 - 0.1 K/uL   Immature Granulocytes 1 %   Abs Immature Granulocytes 0.13 (H) 0.00 - 0.07 K/uL    Comment: Performed at [facility], [address] [place], [place] [address]  CK     Status: Abnormal   Collection Time: 08/26/21  7:55 AM  Result Value Ref Range   Total CK 4,006 (H) 49 - 397 U/L    Comment: Performed at [facility], [address] [place], [place] [address]  Magnesium     Status: None   Collection Time: 08/26/21  7:55 AM  Result Value Ref Range   Magnesium 2.3 1.7 - 2.4 mg/dL    Comment: Performed at [facility], [address] [place], [place] [address]  Ammonia     Status: Abnormal   Collection Time: 08/26/21  7:55 AM  Result Value Ref Range   Ammonia 37 (H) 9 - 35 umol/L    Comment: Performed at [facility], [address] [place], [place] [address]  Valproic acid level     Status: Abnormal   Collection Time: 08/26/21  7:55 AM  Result Value Ref Range   Valproic Acid Lvl 28 (L) 50.0 - 100.0 ug/mL  Comment: Performed at [facility], [address] [place], [place] [address]  Acetaminophen level     Status: Abnormal   Collection Time: 08/26/21  7:55 AM  Result Value Ref Range   Acetaminophen (Tylenol), Serum <10 (L) 10 - 30 ug/mL    Comment: (NOTE) Therapeutic concentrations vary significantly. A range of 10-30 ug/mL  may be an effective concentration for many patients. However, some  are best treated at concentrations outside of this range. Acetaminophen concentrations >150 ug/mL at 4 hours after ingestion  and >50 ug/mL at 12  hours after ingestion are often associated with  toxic reactions.  Performed at [facility], [address] [place], [place] [address]   Salicylate level     Status: Abnormal   Collection Time: 08/26/21  7:55 AM  Result Value Ref Range   Salicylate Lvl <7.0 (L) 7.0 - 30.0 mg/dL    Comment: Performed at [facility], [address] [place], [place] [address]  Procalcitonin - Baseline     Status: None   Collection Time: 08/26/21  7:55 AM  Result Value Ref Range   Procalcitonin <0.10 ng/mL    Comment:        Interpretation: PCT (Procalcitonin) <= 0.5 ng/mL: Systemic infection (sepsis) is not likely. Local bacterial infection is possible. (NOTE)       Sepsis PCT Algorithm           Lower Respiratory Tract                                      Infection PCT Algorithm    ----------------------------     ----------------------------         PCT < 0.25 ng/mL                PCT < 0.10 ng/mL          Strongly encourage             Strongly discourage   discontinuation of antibiotics    initiation of antibiotics    ----------------------------     -----------------------------       PCT 0.25 - 0.50 ng/mL            PCT 0.10 - 0.25 ng/mL               OR       >80% decrease in PCT            Discourage initiation of                                            antibiotics      Encourage discontinuation           of antibiotics    ----------------------------     -----------------------------         PCT >= 0.50 ng/mL              PCT 0.26 - 0.50 ng/mL               AND        <80% decrease in PCT             Encourage initiation of  antibiotics       Encourage continuation           of antibiotics    ----------------------------     -----------------------------        PCT >= 0.50 ng/mL                  PCT > 0.50 ng/mL               AND         increase in PCT                  Strongly encourage                                       initiation of antibiotics    Strongly encourage escalation           of antibiotics                                     -----------------------------                                           PCT <= 0.25 ng/mL                                                 OR                                        > 80% decrease in PCT                                      Discontinue / Do not initiate                                             antibiotics  Performed at [facility], [address] [place], [place] [address]   Protime-INR     Status: None   Collection Time: 08/26/21  7:55 AM  Result Value Ref Range   Prothrombin Time 13.5 11.4 - 15.2 seconds   INR 1.0 0.8 - 1.2    Comment: (NOTE) INR goal varies based on device and disease states. Performed at [facility], [address] [place], [place] [address]   APTT     Status: Abnormal   Collection Time: 08/26/21  7:55 AM  Result Value Ref Range   aPTT 37 (H) 24 - 36 seconds    Comment:        IF BASELINE aPTT IS ELEVATED, SUGGEST PATIENT RISK ASSESSMENT BE USED TO DETERMINE APPROPRIATE ANTICOAGULANT THERAPY. Performed at [facility], [address] [place], [place] [address]   Lactic acid, plasma     Status: None   Collection Time: 08/26/21  9:31 AM  Result Value Ref Range  Lactic Acid, Venous 1.7 0.5 - 1.9 mmol/L    Comment: Performed at [facility], [address] [place], [place] [address]  CSF culture w Gram Stain     Status: None (Preliminary result)   Collection Time: 08/26/21  9:31 AM   Specimen: CSF; Cerebrospinal Fluid  Result Value Ref Range   Specimen Description CSF    Special Requests NONE    Gram Stain      NO ORGANISMS SEEN RED BLOOD CELLS WBC SEEN Performed at [facility], [address] [place], [place] [address]    Culture PENDING    Report Status PENDING   CSF cell count with differential     Status: Abnormal   Collection Time:  08/26/21  9:31 AM  Result Value Ref Range   Tube # 3    Color, CSF COLORLESS COLORLESS   Appearance, CSF CLEAR CLEAR   Supernatant NOT INDICATED    RBC Count, CSF 9 (H) 0 /cu mm   WBC, CSF 7 (H) 0 - 5 /cu mm   Segmented Neutrophils-CSF 0 0 - 6 %   Lymphs, CSF 60 40 - 80 %   Monocyte-Macrophage-Spinal Fluid 40 15 - 45 %   Eosinophils, CSF 0 0 - 1 %    Comment: Performed at [facility], [address] [place], [place] [address]  Protein and glucose, CSF     Status: Abnormal   Collection Time: 08/26/21  9:31 AM  Result Value Ref Range   Glucose, CSF 70 40 - 70 mg/dL   Total  Protein, CSF 57 (H) 15 - 45 mg/dL    Comment: Performed at [facility], [address] [place], [place] [address]  Lactic acid, plasma     Status: None   Collection Time: 08/26/21 10:36 AM  Result Value Ref Range   Lactic Acid, Venous 1.0 0.5 - 1.9 mmol/L    Comment: Performed at [facility], [address] [place], [place] [address]    Current Facility-Administered Medications  Medication Dose Route Frequency Provider Last Rate Last Admin   cyproheptadine (PERIACTIN) 4 MG tablet 12 mg  12 mg Per NG tube Once Agbata, Tochukwu, MD       cyproheptadine (PERIACTIN) 4 MG tablet 2 mg  2 mg Per NG tube Q2H Agbata, Tochukwu, MD       dextrose 5 %-0.45 % sodium chloride infusion   Intravenous Continuous Agbata, Tochukwu, MD 150 mL/hr at 08/26/21 1128 New Bag at 08/26/21 1128   enoxaparin (LOVENOX) injection 40 mg  40 mg Subcutaneous Q24H Agbata, Tochukwu, MD       LORazepam (ATIVAN) injection 2 mg  2 mg Intravenous Q2H PRN Agbata, Tochukwu, MD       ondansetron (ZOFRAN) tablet 4 mg  4 mg Oral Q6H PRN Agbata, Tochukwu, MD       Or   ondansetron (ZOFRAN) injection 4 mg  4 mg Intravenous Q6H PRN Agbata, Tochukwu, MD       vancomycin (VANCOREADY) IVPB 1500 mg/300 mL  1,500 mg Intravenous Once Georga Hacking, MD 150 mL/hr at 08/26/21 1130 1,500 mg at 08/26/21 1130   Current Outpatient  Medications  Medication Sig Dispense Refill   acetaminophen (TYLENOL) 500 MG tablet Take 1,000 mg by mouth every 4 (four) hours as needed for mild pain or headache.      albuterol (VENTOLIN HFA) 108 (90 Base) MCG/ACT inhaler Inhale 2 puffs into the lungs every 6 (six) hours as needed for wheezing  or shortness of breath. 8 g 2   Cholecalciferol (VITAMIN D3) 50 MCG (2000 UT) capsule TAKE 1 CAPSULE BY MOUTH DAILY 30 capsule 12   cloZAPine (CLOZARIL) 100 MG tablet Take 2 tablets (200 mg total) by mouth 2 (two) times daily for 5 days. 20 tablet 0   divalproex (DEPAKOTE ER) 500 MG 24 hr tablet Take 500 mg by mouth at bedtime.      FLUoxetine (PROZAC) 40 MG capsule Take 40 mg by mouth daily.     INGREZZA 80 MG capsule Take 80 mg by mouth daily.     omeprazole (PRILOSEC) 40 MG capsule TAKE 1 CAPSULE BY MOUTH DAILY (Patient taking differently: Take 40 mg by mouth daily.) 30 capsule 5   polycarbophil (FIBERCON) 625 MG tablet Take 625 mg by mouth daily.     simvastatin (ZOCOR) 20 MG tablet TAKE 1 TABLET BY MOUTH DAILY (Patient taking differently: Take 20 mg by mouth daily at 6 PM.) 30 tablet 5   umeclidinium-vilanterol (ANORO ELLIPTA) 62.5-25 MCG/ACT AEPB Inhale 1 puff into the lungs daily at 6 (six) AM. 1 each 5    Musculoskeletal: Strength & Muscle Tone: spastic and abnormal Gait & Station: unable to stand Patient leans: N/A            Psychiatric Specialty Exam:  Presentation  General Appearance: No data recorded Eye Contact:No data recorded Speech:No data recorded Speech Volume:No data recorded Handedness:No data recorded  Mood and Affect  Mood:No data recorded Affect:No data recorded  Thought Process  Thought Processes:No data recorded Descriptions of Associations:No data recorded Orientation:No data recorded Thought Content:No data recorded History of Schizophrenia/Schizoaffective disorder:No data recorded Duration of Psychotic Symptoms:No data recorded Hallucinations:No  data recorded Ideas of Reference:No data recorded Suicidal Thoughts:No data recorded Homicidal Thoughts:No data recorded  Sensorium  Memory:No data recorded Judgment:No data recorded Insight:No data recorded  Executive Functions  Concentration:No data recorded Attention Span:No data recorded Recall:No data recorded Fund of Knowledge:No data recorded Language:No data recorded  Psychomotor Activity  Psychomotor Activity:No data recorded  Assets  Assets:No data recorded  Sleep  Sleep:No data recorded  Physical Exam: Physical Exam Vitals and nursing note reviewed.  Constitutional:      Appearance: He is ill-appearing.  HENT:     Head: Normocephalic and atraumatic.     Mouth/Throat:     Pharynx: Oropharynx is clear.  Eyes:     Pupils: Pupils are equal, round, and reactive to light.  Cardiovascular:     Rate and Rhythm: Normal rate and regular rhythm.  Pulmonary:     Effort: Pulmonary effort is normal.     Breath sounds: Normal breath sounds.  Abdominal:     General: Abdomen is flat.     Palpations: Abdomen is soft.  Musculoskeletal:        General: Normal range of motion.  Skin:    General: Skin is warm and dry.  Neurological:     General: No focal deficit present.     Mental Status: He is alert.     Motor: Tremor present.     Comments: Patient is tremulous all over with very notable stiffness to movement throughout.  The tremor is at least partially clonic and worsened by attempted movement.  Psychiatric:        Mood and Affect: Affect is blunt.        Speech: He is noncommunicative.    Review of Systems  Unable to perform ROS: Medical condition   Blood pressure (!) 142/57, pulse 72,  temperature 98.6 F (37 C), temperature source Oral, resp. rate (!) 22, height 6' (1.829 m), weight 66 kg, SpO2 97 %. Body mass index is 19.73 kg/m.  Treatment Plan Summary: Medication management and Plan patient with recent mental status change.  Workup so far is being done  but has not yet yielded a specific etiology.  Clinically he is appearing to be confused with altered mental state and tremulousness all over.  All of this would be consistent with serotonin syndrome, however his vital signs are not extremely abnormal.  Additionally it is unclear why he would have developed serotonin syndrome if there had not been any change in his medicines and less possibly some metabolic change had affected his blood levels.  Cannot completely rule out neuroleptic malignant syndrome although none of the medicines that he is on are typically thought to be high risk for causing NMS.  I agree with the plan to admit him to the intensive care unit.  CT scan normal.  Lumbar puncture done and results pending.  The only test that immediately occurs to me in addition to following up on his CK is that it might be worthwhile to check an EEG.  It is at least conceivable that this could be a seizure phenomenon and could be related to recent decreases in Depakote or some other etiology.  Otherwise treatment with benzodiazepines and Periactin and supportive therapy seems appropriate as does discontinuing all unnecessary medicines including fluoxetine probably for now the Clozapine as well.  We will follow as needed.  Disposition:  Follow-up medical admission for now  Mordecai Rasmussen, MD 08/26/2021 12:13 PM

## 2021-08-26 NOTE — Consult Note (Signed)
NAME: Christopher Valdez.  DOB: Jan 25, 1960  MRN: [id number]  Date/Time: 08/26/2021 7:00 PM  REQUESTING PROVIDER: Joylene Igo Subjective:  REASON FOR CONSULT: HHV6 csf ? Christopher Valdez. is a 61 y.o.male  with a history of   schizoaffective disroder in a group home was brought in by EMS because of AMS and progressive weaknes sover a week and having fallen out of bed X 3 last night As per EMS not patient has been off clozapine for the past 2 weeks No other history available  Past Medical History:  Diagnosis Date   Calculus of kidney    Chronic constipation    Dyslipidemia    GERD (gastroesophageal reflux disease)    Proteinuria    Schizo-affective schizophrenia (HCC)    Vitamin D deficiency     Past Surgical History:  Procedure Laterality Date   COLONOSCOPY  07/02/2012   CYSTOSCOPY W/ RETROGRADES Right 05/10/2019   Procedure: CYSTOSCOPY WITH RETROGRADE PYELOGRAM;  Surgeon: Sondra Come, MD;  Location: [facility];  Service: Urology;  Laterality: Right;   CYSTOSCOPY/URETEROSCOPY/HOLMIUM LASER/STENT PLACEMENT Right 05/10/2019   Procedure: CYSTOSCOPY/URETEROSCOPY/HOLMIUM LASER/STENT PLACEMENT;  Surgeon: Sondra Come, MD;  Location: [facility];  Service: Urology;  Laterality: Right;    Social History   Socioeconomic History   Marital status: Single    Spouse name: Not on file   Number of children: 0   Years of education: Not on file   Highest education level: High school graduate  Occupational History   Occupation: [employer]  Tobacco Use   Smoking status: Every Day    Packs/day: 1.00    Years: 40.00    Total pack years: 40.00    Types: Cigarettes   Smokeless tobacco: Never  Vaping Use   Vaping Use: Never used  Substance and Sexual Activity   Alcohol use: No    Alcohol/week: 0.0 standard drinks of alcohol   Drug use: No   Sexual activity: Never  Other Topics Concern   Not on file  Social History Narrative   He lives in a group home and works one day per week at [employer]   Social  Determinants of Health   Financial Resource Strain: Low Risk  (04/13/2021)   Overall Financial Resource Strain (CARDIA)    Difficulty of Paying Living Expenses: Not very hard  Food Insecurity: No Food Insecurity (04/13/2021)   Hunger Vital Sign    Worried About Running Out of Food in the Last Year: Never true    Ran Out of Food in the Last Year: Never true  Transportation Needs: No Transportation Needs (04/13/2021)   PRAPARE - Administrator, Civil Service (Medical): No    Lack of Transportation (Non-Medical): No  Physical Activity: Inactive (04/13/2021)   Exercise Vital Sign    Days of Exercise per Week: 0 days    Minutes of Exercise per Session: 0 min  Stress: No Stress Concern Present (04/13/2021)   [employer] of Occupational Health - Occupational Stress Questionnaire    Feeling of Stress : Not at all  Social Connections: Socially Isolated (04/13/2021)   Social Connection and Isolation Panel [NHANES]    Frequency of Communication with Friends and Family: More than three times a week    Frequency of Social Gatherings with Friends and Family: Twice a week    Attends Religious Services: Never    Database administrator or Organizations: No    Attends Banker Meetings: Never    Marital  Status: Never married  Intimate Partner Violence: Not At Risk (04/13/2021)   Humiliation, Afraid, Rape, and Kick questionnaire    Fear of Current or Ex-Partner: No    Emotionally Abused: No    Physically Abused: No    Sexually Abused: No    Family History  Problem Relation Age of Onset   Parkinson's disease Mother    Deep vein thrombosis Father    Osteoarthritis Father    Allergies  Allergen Reactions   Tall Ragweed Other (See Comments)    Pollen   I? Current Facility-Administered Medications  Medication Dose Route Frequency Provider Last Rate Last Admin   Chlorhexidine Gluconate Cloth 2 % PADS 6 each  6 each Topical Daily Agbata, Tochukwu, MD   6 each at 08/26/21 1530    dextrose 5 %-0.45 % sodium chloride infusion   Intravenous Continuous Agbata, Tochukwu, MD 150 mL/hr at 08/26/21 1128 New Bag at 08/26/21 1128   enoxaparin (LOVENOX) injection 40 mg  40 mg Subcutaneous Q24H Agbata, Tochukwu, MD       ipratropium-albuterol (DUONEB) 0.5-2.5 (3) MG/3ML nebulizer solution 3 mL  3 mL Nebulization Q6H PRN Agbata, Tochukwu, MD       LORazepam (ATIVAN) injection 2 mg  2 mg Intravenous Q2H PRN Agbata, Tochukwu, MD   2 mg at 08/26/21 1222   ondansetron (ZOFRAN) tablet 4 mg  4 mg Oral Q6H PRN Agbata, Tochukwu, MD       Or   ondansetron (ZOFRAN) injection 4 mg  4 mg Intravenous Q6H PRN Agbata, Tochukwu, MD       Oral care mouth rinse  15 mL Mouth Rinse PRN Agbata, Tochukwu, MD         Abtx:  Anti-infectives (From admission, onward)    Start     Dose/Rate Route Frequency Ordered Stop   08/26/21 0930  cefTRIAXone (ROCEPHIN) 2 g in sodium chloride 0.9 % 100 mL IVPB        2 g 200 mL/hr over 30 Minutes Intravenous  Once 08/26/21 0926 08/26/21 1021   08/26/21 0930  vancomycin (VANCOREADY) IVPB 1500 mg/300 mL        1,500 mg 150 mL/hr over 120 Minutes Intravenous  Once 08/26/21 0926 08/26/21 1333   08/26/21 0930  ampicillin (OMNIPEN) 2 g in sodium chloride 0.9 % 100 mL IVPB        2 g 300 mL/hr over 20 Minutes Intravenous  Once 08/26/21 0926 08/26/21 1123       REVIEW OF SYSTEMS:  NA ?  Objective:  VITALS:  BP 125/71   Pulse 72   Temp 99 F (37.2 C) (Axillary)   Resp (!) 21   Ht 6' (1.829 m)   Wt 61.4 kg   SpO2 96%   BMI 18.36 kg/m   PHYSICAL EXAM:  General: awake, but mute Head: Normocephalic, without obvious abnormality, atraumatic. Eyes: Conjunctivae clear, anicteric sclerae. Pupils are equal ENT cannot assess Neck: stiff- but he is stiff everywhere l, no adenopathy, thyroid: non tender no carotid bruit and no JVD. Lungs: b/la ir entry. Heart: Regular rate and rhythm, no murmur, rub or gallop. Abdomen: Soft, non-tender,not distended. Bowel  sounds normal. No masses Extremities: cog wheel rigidity of upper extremities Tremors when extremities moved Skin: No rashes or lesions. Or bruising Lymph: Cervical, supraclavicular normal. Neurologic: cannot assess Pertinent Labs Lab Results CBC    Component Value Date/Time   WBC 19.9 (H) 08/26/2021 0755   RBC 4.52 08/26/2021 0755   HGB 13.1 08/26/2021 0755  HGB 13.0 10/20/2015 1042   HCT 40.6 08/26/2021 0755   HCT 37.7 10/20/2015 1042   PLT 298 08/26/2021 0755   PLT 273 10/20/2015 1042   MCV 89.8 08/26/2021 0755   MCV 90 10/20/2015 1042   MCH 29.0 08/26/2021 0755   MCHC 32.3 08/26/2021 0755   RDW 16.0 (H) 08/26/2021 0755   RDW 13.7 10/20/2015 1042   LYMPHSABS 0.6 (L) 08/26/2021 0755   LYMPHSABS 1.7 10/20/2015 1042   MONOABS 1.7 (H) 08/26/2021 0755   EOSABS 0.0 08/26/2021 0755   EOSABS 0.2 10/20/2015 1042   BASOSABS 0.0 08/26/2021 0755   BASOSABS 0.0 10/20/2015 1042       Latest Ref Rng & Units 08/26/2021    7:55 AM 07/21/2021    5:33 AM 07/21/2021    2:50 AM  CMP  Glucose 70 - 99 mg/dL 767  209    BUN 8 - 23 mg/dL 44  8    Creatinine 4.70 - 1.24 mg/dL 9.62  8.36  6.29   Sodium 135 - 145 mmol/L 150  140    Potassium 3.5 - 5.1 mmol/L 3.7  3.9    Chloride 98 - 111 mmol/L 115  107    CO2 22 - 32 mmol/L 22  26    Calcium 8.9 - 10.3 mg/dL 47.6  9.4    Total Protein 6.5 - 8.1 g/dL 7.8  6.9    Total Bilirubin 0.3 - 1.2 mg/dL 1.2  1.0    Alkaline Phos 38 - 126 U/L 107  101    AST 15 - 41 U/L 76  38    ALT 0 - 44 U/L 33  19        Microbiology: Recent Results (from the past 240 hour(s))  CSF culture w Gram Stain     Status: None (Preliminary result)   Collection Time: 08/26/21  9:31 AM   Specimen: CSF; Cerebrospinal Fluid  Result Value Ref Range Status   Specimen Description CSF  Final   Special Requests NONE  Final   Gram Stain   Final    NO ORGANISMS SEEN RED BLOOD CELLS WBC SEEN Performed at [facility], [address] [place], [place]  [address]    Culture PENDING  Incomplete   Report Status PENDING  Incomplete  MRSA Next Gen by PCR, Nasal     Status: None   Collection Time: 08/26/21  3:29 PM   Specimen: Nasal Mucosa; Nasal Swab  Result Value Ref Range Status   MRSA by PCR Next Gen NOT DETECTED NOT DETECTED Final    Comment: (NOTE) The GeneXpert MRSA Assay (FDA approved for NASAL specimens only), is one component of a comprehensive MRSA colonization surveillance program. It is not intended to diagnose MRSA infection nor to guide or monitor treatment for MRSA infections. Test performance is not FDA approved in patients less than 52 years old. Performed at [facility], [address] [place], [place] [address]     IMAGING RESULTS:  I have personally reviewed the films No infiltrate ? Impression/Recommendation ?Pt admitted with altered mental status and found to have rigidity,increase in CK and tremors with concern for Neuroleptic malignant syndrome. HE also has some cog wheel rigidity -- ?? parkinsonism Could stopping clozapine have the above side effects? Cthead N  LP not suggestive of infecitous etiology- WBC 7, protein 54 could be from the above R/o seizures  HHV6 PCR in CSF is questionable significance-I dont think this is reactivation and also HHV6 can be  integrated in DNA  So will not do anything for that May check HHV^ 6 serum and csf quantitative PCR  Manage the NM ? ?Hypernatreemia Leucocytosis could be from Stress repsonse/dehydration/NMS ___watch for aspiration ________________________________________________ Discussed with requesting provider Note:  This document was prepared using Dragon voice recognition software and may include unintentional dictation errors.

## 2021-08-26 NOTE — Consult Note (Signed)
NAME:  Montavious Wierzba., MRN:  [id number], DOB:  05-18-1960, LOS: 0 ADMISSION DATE:  08/26/2021, CONSULTATION DATE:  08/26/21 REFERRING MD:  Joylene Igo, CHIEF COMPLAINT:  AMS   History of Present Illness:  61 year old man with hx of schizophrenia, recurrent aspiration presenting with recurrent falls and tremors.  The only question he answered for me was that he was thirsty.  He has hypernatremia, mild acute kidney injury, and elevated CK.  CT head and LP done in ER neg.  PCCM asked to assist with workup.  Again, history extremely limited due to patient mutism.  Pertinent  Medical History  Schizophrenia Aspiration pneumonitis  Significant Hospital Events: Including procedures, antibiotic start and stop dates in addition to other pertinent events   08/26/21 admission  Interim History / Subjective:  Consulted  Objective   Blood pressure (!) 142/57, pulse 72, temperature 99.4 F (37.4 C), temperature source Rectal, resp. rate (!) 22, height 6' (1.829 m), weight 66 kg, SpO2 97 %.       No intake or output data in the 24 hours ending 08/26/21 1241 Filed Weights   08/26/21 0753  Weight: 66 kg    Examination: General: chronically ill appearing cachetic man sitting in bed HENT: MM dry, trachea midline, +temporal wasting Lungs: clear, no wheezing, mld tachypnea Cardiovascular: RRR, ext warm, strong pulses Abdomen: soft, +BS Extremities: muscle wasting + bruising Neuro: cogwheeling rigidity in all 4 ext, occasional resting tremors Skin: No rashes  Review of imaging and labs per HPI  Resolved Hospital Problem list   N/A  Assessment & Plan:  AMS, parkinsonian rigidity, elevated CK- seems most c/w with neuroleptic malignant syndrome vs. Malignant catatonia more than serotonin syndrome.  Temps are borderline.  He is in a grey area for diagnosis of this.  Agree with psych's analysis. - Check EEG - IV hydration - Hold all psych meds - No clear indication for dantrolene, bromocriptine -  May warrant benzodiazepine challenge - Will follow to assure ability to protect airway  Best Practice (right click and "Reselect all SmartList Selections" daily)   Per primary  Labs   CBC: Recent Labs  Lab 08/26/21 0755  WBC 19.9*  NEUTROABS 17.5*  HGB 13.1  HCT 40.6  MCV 89.8  PLT 298    Basic Metabolic Panel: Recent Labs  Lab 08/26/21 0755  NA 150*  K 3.7  CL 115*  CO2 22  GLUCOSE 104*  BUN 44*  CREATININE 1.23  CALCIUM 10.3  MG 2.3   GFR: Estimated Creatinine Clearance: 58.1 mL/min (by C-G formula based on SCr of 1.23 mg/dL). Recent Labs  Lab 08/26/21 0755 08/26/21 0931 08/26/21 1036  PROCALCITON <0.10  --   --   WBC 19.9*  --   --   LATICACIDVEN  --  1.7 1.0    Liver Function Tests: Recent Labs  Lab 08/26/21 0755  AST 76*  ALT 33  ALKPHOS 107  BILITOT 1.2  PROT 7.8  ALBUMIN 4.6   No results for input(s): "LIPASE", "AMYLASE" in the last 168 hours. Recent Labs  Lab 08/26/21 0755  AMMONIA 37*    ABG    Component Value Date/Time   HCO3 28.4 (H) 07/20/2021 2135   O2SAT 84 07/20/2021 2135     Coagulation Profile: Recent Labs  Lab 08/26/21 0755  INR 1.0    Cardiac Enzymes: Recent Labs  Lab 08/26/21 0755  CKTOTAL 4,006*    HbA1C: Hgb A1c MFr Bld  Date/Time Value Ref Range Status  09/30/2020 02:39 PM 5.2 <5.7 % of total Hgb Final    Comment:    For the purpose of screening for the presence of diabetes: . <5.7%       Consistent with the absence of diabetes 5.7-6.4%    Consistent with increased risk for diabetes             (prediabetes) > or =6.5%  Consistent with diabetes . This assay result is consistent with a decreased risk of diabetes. . Currently, no consensus exists regarding use of hemoglobin A1c for diagnosis of diabetes in children. . According to American Diabetes Association (ADA) guidelines, hemoglobin A1c <7.0% represents optimal control in non-pregnant diabetic patients. Different metrics may apply to  specific patient populations.  Standards of Medical Care in Diabetes(ADA). Marland Kitchen   10/01/2019 02:06 PM 5.6 <5.7 % of total Hgb Final    Comment:    For the purpose of screening for the presence of diabetes: . <5.7%       Consistent with the absence of diabetes 5.7-6.4%    Consistent with increased risk for diabetes             (prediabetes) > or =6.5%  Consistent with diabetes . This assay result is consistent with a decreased risk of diabetes. . Currently, no consensus exists regarding use of hemoglobin A1c for diagnosis of diabetes in children. . According to American Diabetes Association (ADA) guidelines, hemoglobin A1c <7.0% represents optimal control in non-pregnant diabetic patients. Different metrics may apply to specific patient populations.  Standards of Medical Care in Diabetes(ADA). .     CBG: No results for input(s): "GLUCAP" in the last 168 hours.  Review of Systems:   Cannot assess due to mental status  Past Medical History:  He,  has a past medical history of Calculus of kidney, Chronic constipation, Dyslipidemia, GERD (gastroesophageal reflux disease), Proteinuria, Schizo-affective schizophrenia (HCC), and Vitamin D deficiency.   Surgical History:   Past Surgical History:  Procedure Laterality Date   COLONOSCOPY  07/02/2012   CYSTOSCOPY W/ RETROGRADES Right 05/10/2019   Procedure: CYSTOSCOPY WITH RETROGRADE PYELOGRAM;  Surgeon: Sondra Come, MD;  Location: [facility];  Service: Urology;  Laterality: Right;   CYSTOSCOPY/URETEROSCOPY/HOLMIUM LASER/STENT PLACEMENT Right 05/10/2019   Procedure: CYSTOSCOPY/URETEROSCOPY/HOLMIUM LASER/STENT PLACEMENT;  Surgeon: Sondra Come, MD;  Location: [facility];  Service: Urology;  Laterality: Right;     Social History:   reports that he has been smoking cigarettes. He has a 40.00 pack-year smoking history. He has never used smokeless tobacco. He reports that he does not drink alcohol and does not use drugs.   Family  History:  His family history includes Deep vein thrombosis in his father; Osteoarthritis in his father; Parkinson's disease in his mother.   Allergies Allergies  Allergen Reactions   Tall Ragweed Other (See Comments)    Pollen     Home Medications  Prior to Admission medications   Medication Sig Start Date End Date Taking? Authorizing Provider  acetaminophen (TYLENOL) 500 MG tablet Take 1,000 mg by mouth every 4 (four) hours as needed for mild pain or headache.     [provider]  albuterol (VENTOLIN HFA) 108 (90 Base) MCG/ACT inhaler Inhale 2 puffs into the lungs every 6 (six) hours as needed for wheezing or shortness of breath. 05/02/21   Amin, Loura Halt, MD  Cholecalciferol (VITAMIN D3) 50 MCG (2000 UT) capsule TAKE 1 CAPSULE BY MOUTH DAILY 08/09/21   Alba Cory, MD  cloZAPine (CLOZARIL) 100 MG tablet  Take 2 tablets (200 mg total) by mouth 2 (two) times daily for 5 days. 07/22/21 07/27/21  Tresa Moore, MD  divalproex (DEPAKOTE ER) 500 MG 24 hr tablet Take 500 mg by mouth at bedtime.  04/16/19   [provider]  FLUoxetine (PROZAC) 40 MG capsule Take 40 mg by mouth daily.    [provider]  INGREZZA 80 MG capsule Take 80 mg by mouth daily. 01/26/21   [provider]  omeprazole (PRILOSEC) 40 MG capsule TAKE 1 CAPSULE BY MOUTH DAILY Patient taking differently: Take 40 mg by mouth daily. 05/18/21   Alba Cory, MD  polycarbophil (FIBERCON) 625 MG tablet Take 625 mg by mouth daily.    [provider]  simvastatin (ZOCOR) 20 MG tablet TAKE 1 TABLET BY MOUTH DAILY Patient taking differently: Take 20 mg by mouth daily at 6 PM. 03/02/21   Carlynn Purl, Danna Hefty, MD  umeclidinium-vilanterol [facility] ELLIPTA) 62.5-25 MCG/ACT AEPB Inhale 1 puff into the lungs daily at 6 (six) AM. 08/03/21   Alba Cory, MD     Critical care time: N/A

## 2021-08-26 NOTE — Assessment & Plan Note (Addendum)
Continue IV hydration.  Sodium is improving, monitor

## 2021-08-27 ENCOUNTER — Encounter: Payer: Self-pay | Admitting: Internal Medicine

## 2021-08-27 ENCOUNTER — Inpatient Hospital Stay: Payer: Medicare Other

## 2021-08-27 DIAGNOSIS — G2579 Other drug induced movement disorders: Secondary | ICD-10-CM | POA: Diagnosis not present

## 2021-08-27 DIAGNOSIS — E43 Unspecified severe protein-calorie malnutrition: Secondary | ICD-10-CM

## 2021-08-27 DIAGNOSIS — G9341 Metabolic encephalopathy: Principal | ICD-10-CM

## 2021-08-27 DIAGNOSIS — E870 Hyperosmolality and hypernatremia: Secondary | ICD-10-CM | POA: Diagnosis not present

## 2021-08-27 DIAGNOSIS — R4182 Altered mental status, unspecified: Secondary | ICD-10-CM | POA: Diagnosis not present

## 2021-08-27 DIAGNOSIS — M6282 Rhabdomyolysis: Secondary | ICD-10-CM | POA: Diagnosis not present

## 2021-08-27 LAB — CBC
HCT: 33.6 % — ABNORMAL LOW (ref 39.0–52.0)
Hemoglobin: 10.7 g/dL — ABNORMAL LOW (ref 13.0–17.0)
MCH: 28.3 pg (ref 26.0–34.0)
MCHC: 31.8 g/dL (ref 30.0–36.0)
MCV: 88.9 fL (ref 80.0–100.0)
Platelets: 225 10*3/uL (ref 150–400)
RBC: 3.78 MIL/uL — ABNORMAL LOW (ref 4.22–5.81)
RDW: 16.3 % — ABNORMAL HIGH (ref 11.5–15.5)
WBC: 11.5 10*3/uL — ABNORMAL HIGH (ref 4.0–10.5)
nRBC: 0 % (ref 0.0–0.2)

## 2021-08-27 LAB — BASIC METABOLIC PANEL
Anion gap: 6 (ref 5–15)
BUN: 36 mg/dL — ABNORMAL HIGH (ref 8–23)
CO2: 25 mmol/L (ref 22–32)
Calcium: 9 mg/dL (ref 8.9–10.3)
Chloride: 116 mmol/L — ABNORMAL HIGH (ref 98–111)
Creatinine, Ser: 0.89 mg/dL (ref 0.61–1.24)
GFR, Estimated: >60 mL/min (ref >60)
Glucose, Bld: 140 mg/dL — ABNORMAL HIGH (ref 70–99)
Potassium: 3.3 mmol/L — ABNORMAL LOW (ref 3.5–5.1)
Sodium: 147 mmol/L — ABNORMAL HIGH (ref 135–145)

## 2021-08-27 LAB — CK: Total CK: 9278 U/L — ABNORMAL HIGH (ref 49–397)

## 2021-08-27 LAB — TSH: TSH: 4.207 u[IU]/mL (ref 0.350–4.500)

## 2021-08-27 MED ORDER — NICOTINE 21 MG/24HR TD PT24
21.0000 mg | MEDICATED_PATCH | Freq: Every day | TRANSDERMAL | Status: DC
  Administered 2021-08-27 – 2021-09-02 (×7): 21 mg via TRANSDERMAL
  Filled 2021-08-27 (×7): qty 1

## 2021-08-27 MED ORDER — GADOBUTROL 1 MMOL/ML IV SOLN
6.0000 mL | Freq: Once | INTRAVENOUS | Status: AC | PRN
  Administered 2021-08-27: 6 mL via INTRAVENOUS

## 2021-08-27 MED ORDER — ORAL CARE MOUTH RINSE: 15.0000 mL | OROMUCOSAL | Status: DC | PRN

## 2021-08-27 NOTE — Hospital Course (Signed)
61 y.o. male with medical history significant for schizoaffective disorder, GERD, nephrolithiasis who was sent over from group home for evaluation of generalized weakness, altered mental status and multiple falls.  8/17: Psychiatry, ID consult, stepdown status 8/18: Neurology consult, EEG showed no seizure, moving out to floors 8/19: Patient continues to remain altered and further work-up per neurology pending.  CK levels continue to remain elevated and patient remains on IV fluid.  8/20: MRI brain was negative for any acute abnormality.  CK level started trending down, at 4614 today.  Paraneoplastic and autoimmune labs are still pending.  RPR pending. Losartan 25 mg was added for persistently elevated blood pressure. 8/21: Patient with food pocketing in his mouth requiring a lot of reminders to swallow.  Drinking boost without any problem. Diet can be switched to full liquid. Blood pressure remained elevated-losartan increased to 50 mg daily.  Potassium at 3.3 which is being repleted. CK continues to trend down, at 2347 today. RPR negative ParaNeoplastic panel pending. EEG done today which shows moderate diffuse encephalopathy.  No seizures or epileptiform changes were seen. PT/OT recommending SNF.  8/22: CK continued to improve, at 813 today.  Paraneoplastic panel still pending.  Concern of Parkinson's plus syndrome and dementia based on neurologic exam per neurology note. Per neurology differential can be a long-term side effect of neuroleptics, he was on clozapine and also takes Ingrezza. Please see neurology note for further detail. Patient refusing most of p.o. intake. Palliative met with sister and family is leaning more towards focus on comfort, TOC is looking whether to discharge to facility with hospice versus hospice facility if he qualifies. IV fluid rate decreased to 100.  We can discontinue IV fluid if his start taking p.o. or family makes him complete comfort care.  8/23: CK  normalized and CBC has been improved.  Blood pressure remains elevated, starting him on amlodipine 5 mg daily.  Family decided to proceed with more comfort focus care. Paraneoplastic labs still pending. Family wants him to go to hospice facility-got approval but no bed availability. Patient remained very somnolent and continue to refuse most of the p.o. intake.  8/24: Patient is awake but not oriented or able to participate in care.  Not taking most of the p.o. intake.  We switched his seizure medications to IV to prevent seizures. Patient is being discharged to hospice facility for end-of-life care.

## 2021-08-27 NOTE — Assessment & Plan Note (Signed)
Complicates overall prognosis.  Per his sister he has had about 30 to 40 pound weight loss since January of this year.  She reports significant clinical and functional decline over last six 1 to 1-year

## 2021-08-27 NOTE — TOC Initial Note (Signed)
Transition of Care (TOC) - Initial/Assessment Note    Patient Details  Name: Christopher Valdez. MRN: [id number] Date of Birth: 06/01/1960  Transition of Care [facility]) CM/SW Contact:    Allayne Butcher, RN Phone Number: 08/27/2021, 11:18 AM  Clinical Narrative:                 Patient admitted to the hospital with serotonin syndrome.  Patient is from [facility], per his sister, [name], he has lived there for over 30 years.  Verdis Prime, patient's sisters, will be coming up to the hospital this afternoon to see patient.  They are concerned that he may be getting to the point where he needs a higher level of care.  He has lost over 30 pounds since January and has been in the hospital 3 times in the past few months.   MD put in for PT eval.  TOC will cont to follow.   Expected Discharge Plan: Group Home Barriers to Discharge: Continued Medical Work up   Patient Goals and CMS Choice Patient states their goals for this hospitalization and ongoing recovery are:: Patient unable to state- sisters are wondering if he needs a higher level of care      Expected Discharge Plan and Services Expected Discharge Plan: Group Home   Discharge Planning Services: CM Consult   Living arrangements for the past 2 months: Group Home                                      Prior Living Arrangements/Services Living arrangements for the past 2 months: Group Home Lives with:: Facility Resident Patient language and need for interpreter reviewed:: Yes Do you feel safe going back to the place where you live?: Yes      Need for Family Participation in Patient Care: Yes (Comment) Care giver support system in place?: Yes (comment)   Criminal Activity/Legal Involvement Pertinent to Current Situation/Hospitalization: No - Comment as needed  Activities of Daily Living Home Assistive Devices/Equipment: None ADL Screening (condition at time of admission) Patient's cognitive ability adequate to  safely complete daily activities?: No Is the patient deaf or have difficulty hearing?: No Does the patient have difficulty seeing, even when wearing glasses/contacts?: No Does the patient have difficulty concentrating, remembering, or making decisions?: No Patient able to express need for assistance with ADLs?: Yes Does the patient have difficulty dressing or bathing?: No Independently performs ADLs?: Yes (appropriate for developmental age) Does the patient have difficulty walking or climbing stairs?: Yes Weakness of Legs: Both Weakness of Arms/Hands: None  Permission Sought/Granted Permission sought to share information with : Case Manager, Magazine features editor, Family Supports Permission granted to share information with : Yes, Verbal Permission Granted  Share Information with NAME: Wilkie Zenon  Permission granted to share info w AGENCY: [facility]  Permission granted to share info w Relationship: sister  Permission granted to share info w Contact Information: [phone]  Emotional Assessment Appearance:: Appears stated age Attitude/Demeanor/Rapport: Inconsistent, Self-Absorbed Affect (typically observed): Quiet, Flat Orientation: : Oriented to Self, Oriented to Place Alcohol / Substance Use: Not Applicable Psych Involvement: Yes (comment)  Admission diagnosis:  Serotonin syndrome [G25.79] Altered mental status, unspecified altered mental status type [R41.82] Patient Active Problem List   Diagnosis Date Noted   Serotonin syndrome 08/26/2021   Hypernatremia 08/26/2021   Rhabdomyolysis 08/26/2021   Atherosclerosis of aorta (HCC) 08/03/2021   Moderate  protein-calorie malnutrition (HCC) 08/03/2021   Other emphysema (HCC) 08/03/2021   Anemia 07/21/2021   Elevated troponin 07/21/2021   COPD (chronic obstructive pulmonary disease) (HCC) 07/21/2021   Pressure injury of skin 05/02/2021   History of aspiration pneumonia 05/01/2021   Essential hypertension 02/17/2020    Other disorders of kidney and ureter in diseases classified elsewhere 02/17/2020   Hydronephrosis of right kidney 04/03/2018   Right kidney stone 04/03/2018   Avitaminosis D 10/29/2014   Enlarged prostate 10/29/2014   Other schizoaffective disorders (HCC) 10/10/2014   Proteinuria 10/10/2014   Dyslipidemia 10/10/2014   History of kidney stones 10/10/2014   External hemorrhoid 10/10/2014   Chronic constipation 10/10/2014   Tobacco use 10/10/2014   GERD without esophagitis 10/10/2014   PCP:  Alba Cory, MD Pharmacy:   [facility] - [place], [place] - [address] [place] [address] Phone: [phone] Fax: [phone]     Social Determinants of Health (SDOH) Interventions    Readmission Risk Interventions     No data to display

## 2021-08-27 NOTE — Progress Notes (Signed)
  Progress Note   Patient: Christopher Valdez. KVQ:[id number] DOB: 05/09/1960 DOA: 08/26/2021     1 DOS: the patient was seen and examined on 08/27/2021   Brief hospital course: 61 y.o. male with medical history significant for schizoaffective disorder, GERD, nephrolithiasis who was sent over from group home for evaluation of generalized weakness, altered mental status and multiple falls.  8/17: Psychiatry, ID consult, stepdown status 8/18: Neurology consult, EEG showed no seizure, moving out to floors   Assessment and Plan: Acute metabolic encephalopathy Likely multifactorial.  Discussed with ID, neurology and reviewed psychiatry input EEG did not show any seizure activity Unlikely neuroleptic malignant or serotonin syndrome CSF positive for HHV-6 but per discussion with ID likely not significant Cannot rule out paraneoplastic syndrome and or autoimmune encephalitis considering significant weight loss of 30-40 pounds since January Neurology considering MRI of the brain tonight   Rhabdomyolysis Continue aggressive IV fluid hydration.  He has had multiple falls which could be the etiology Hold statins Check daily CK levels.  His CK is still trending up which is worrisome [id number]  Hypernatremia Continue IV hydration.  Sodium is improving, monitor  Protein-calorie malnutrition, severe (HCC) Complicates overall prognosis.  Per his sister he has had about 30 to 40 pound weight loss since January of this year.  She reports significant clinical and functional decline over last six 1 to 1-year  COPD (chronic obstructive pulmonary disease) (HCC) Not acutely exacerbated  as needed bronchodilator therapy.  Tobacco use Counseled.  Nicotine patch ordered  Other schizoaffective disorders (HCC) Hold all antipsychotic agents for now.  Seen by psychiatry.  Currently on as needed Ativan       Subjective: Much more awake.  He did eat some breakfast.  Could tell me that he is in the hospital  but still quite tremulous  Physical Exam: Vitals:   08/27/21 1100 08/27/21 1200 08/27/21 1453 08/27/21 1644  BP: (!) 165/71 (!) 149/77 (!) 172/79 (!) 167/63  Pulse:   79 62  Resp: (!) 26 20 15    Temp:  98.5 F (36.9 C) 98.1 F (36.7 C) 98.6 F (37 C)  TempSrc:    Oral  SpO2:   96% 98%  Weight:      Height:       61 year old severely cachectic male sitting in the bed in no acute distress Lungs clear to auscultation bilaterally Cardiovascular regular rate and rhythm Abdomen soft, benign Neurology: Tremulous and has cogwheel rigidity.  He is awake and follows simple commands Skin no rash or lesion Data Reviewed:  CK9 278, sodium 147, potassium 3.3  Family Communication: Sister updated over phone  He is DNR.  He is at high risk for cardiorespiratory failure and death.  Family is well aware of this.  Overall poor prognosis.  Palliative care consult is pending.  Disposition: Status is: Inpatient Remains inpatient appropriate because: Further work-up of his altered mental status   Planned Discharge Destination: Group home    DVT prophylaxis-Lovenox Time spent: 35 minutes  Author: 67, MD 08/27/2021 5:27 PM  For on call review [email or web address].

## 2021-08-27 NOTE — Progress Notes (Signed)
Date of Admission:  08/26/2021    Sisters at bed side  ID: Christopher Valdez. is a 61 y.o. 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 Hs lost 40 pounds Worsening tremors Falls Has been in a group home for > 30 years smoker  Medications:   Chlorhexidine Gluconate Cloth  6 each Topical Daily   enoxaparin (LOVENOX) injection  40 mg Subcutaneous Q24H    Objective: Vital signs in last 24 hours: Temp:  [98.2 F (36.8 C)-99.4 F (37.4 C)] 98.6 F (37 C) (08/18 0800) Pulse Rate:  [51-103] 64 (08/18 0900) Resp:  [9-26] 23 (08/18 1000) BP: (104-176)/(52-88) 154/68 (08/18 1000) SpO2:  [91 %-98 %] 95 % (08/18 1000) Weight:  [61.4 kg] 61.4 kg (08/17 1435)    PHYSICAL EXAM:  General: Awake, some conversation He does not allow full examination   Head: Normocephalic, without obvious abnormality, atraumatic. Eyes: Conjunctivae clear, anicteric sclerae. Pupils are equal ENT Nares normal. No drainage or sinus tenderness. Lips, mucosa, and tongue normal. No Thrush Neck:  symmetrical, no adenopathy, thyroid: non tender no carotid bruit and no JVD. Lungs: b/l air entry. Heart: Regular rate and rhythm, no murmur, rub or gallop. Abdomen: Soft, non-tender,not distended. Bowel sounds normal. No masses Extremities: atraumatic, no cyanosis. No edema. No clubbing Skin: No rashes or lesions. Or bruising Lymph: Cervical, supraclavicular normal. Neurologic: cog wheel rigidity tremors  Lab Results Recent Labs    08/26/21 0755 08/27/21 0421  WBC 19.9* 11.5*  HGB 13.1 10.7*  HCT 40.6 33.6*  NA 150* 147*  K 3.7 3.3*  CL 115* 116*  CO2 22 25  BUN 44* 36*   CREATININE 1.23 0.89   Liver Panel Recent Labs    08/26/21 0755  PROT 7.8  ALBUMIN 4.6  AST 76*  ALT 33  ALKPHOS 107  BILITOT 1.2   Sedimentation Rate No results for input(s): "ESRSEDRATE" in the last 72 hours. C-Reactive Protein No results for input(s): "CRP" in the last 72 hours.  Microbiology: [facility]- NG Studies/Results: CT HEAD WO CONTRAST ( )  Result Date: 08/26/2021 CLINICAL DATA:  Altered mental status. EXAM: CT HEAD WITHOUT CONTRAST TECHNIQUE: Contiguous axial images were obtained from the base of the skull through the vertex without intravenous contrast. RADIATION DOSE REDUCTION: This exam was performed according to the departmental dose-optimization program which includes automated exposure control, adjustment of the mA and/or kV according to patient size and/or use of iterative reconstruction technique. COMPARISON:  None Available. FINDINGS: Brain: No evidence of acute infarction, hemorrhage, hydrocephalus, extra-axial collection or mass lesion/mass effect. Vascular: No hyperdense vessel or unexpected calcification. Skull: Normal. Negative for fracture or focal lesion. Sinuses/Orbits: No acute finding. Other: None. IMPRESSION: No acute intracranial abnormality seen. Electronically Signed   By: Lupita Raider M.D.   On: 08/26/2021 08:37   DG Chest Portable 1 View  Result Date: 08/26/2021 CLINICAL DATA:  SOB per ordering notes. Patient unable to give adequate history at this time- per RN notes- Pt here via [facility] from [facility] with weakness and 3 unwitnessed fall today. Pt verbal at times, will not respond to questions about pain sob EXAM:  PORTABLE CHEST 1 VIEW COMPARISON:  None Available. FINDINGS: Normal mediastinum and cardiac silhouette. Normal pulmonary vasculature. No evidence of effusion, infiltrate, or pneumothorax. No acute bony abnormality. Healed anterior LEFT rib fractures. IMPRESSION: No acute cardiopulmonary process. Electronically Signed   By: Genevive Bi M.D.   On: 08/26/2021 08:16     Assessment/Plan: ?Pt admitted with altered mental status and found to have rigidity,increase in CK and tremors with concern for Neuroleptic malignant syndrome. HE also has some cog wheel rigidity -- ?? parkinsonism  Falls- could also explain rhabdo   LP not suggestive of infecitous etiology- WBC 7, protein 54 -   HHV6 PCR in CSF is questionable significance-I dont think this is reactivation and also HHV6 can be integrated in DNA  in 1-2% of population No need for any treatment now May check HHV^ 6 serum and csf quantitative PCR   R/o paraneoplastic syndrome /autoimmune encephalitis with weight loss, change in mental status ? ?Hypernatremia could be from dehydration Leucocytosis - has resolved  Discussed the management with sisters, neurologist and hospitalist  ID will follow him peripherally this weekend- oncall team available by phone for urgent issues

## 2021-08-27 NOTE — Procedures (Signed)
Routine EEG Report  Christopher Valdez. is a 61 y.o. male with a history of encephalopathy who is undergoing an EEG to evaluate for seizures.  Report: This EEG was acquired with electrodes placed according to the International 10-20 electrode system (including Fp1, Fp2, F3, F4, C3, C4, P3, P4, O1, O2, T3, T4, T5, T6, A1, A2, Fz, Cz, Pz). The following electrodes were missing or displaced: none.  The occipital dominant rhythm was 6 Hz with overriding beta frequencies. This activity is reactive to stimulation. Drowsiness was manifested by background fragmentation; deeper stages of sleep were identified by K complexes and sleep spindles. There was no focal slowing. There were no interictal epileptiform discharges. There were no electrographic seizures identified. Photic stimulation and hyperventilation were not performed.   Impression and clinical correlation: This EEG was obtained while awake and asleep and is abnormal due to moderate diffuse slowing indicative of global cerebral dysfunction. Epileptiform abnormalities were not seen during this recording.  Bing Neighbors, MD [facility] [phone]  If 7pm- 7am, please page neurology on call as listed in AMION.

## 2021-08-27 NOTE — Evaluation (Addendum)
Clinical/Bedside Swallow Evaluation Patient Details  Name: Christopher Valdez. MRN: [id number] Date of Birth: 11/25/1960  Today's Date: 08/27/2021 Time: SLP Start Time (ACUTE ONLY): 0815 SLP Stop Time (ACUTE ONLY): 0915 SLP Time Calculation (min) (ACUTE ONLY): 60 min  Past Medical History:  Past Medical History:  Diagnosis Date   Calculus of kidney    Chronic constipation    Dyslipidemia    GERD (gastroesophageal reflux disease)    Proteinuria    Schizo-affective schizophrenia (HCC)    Vitamin D deficiency    Past Surgical History:  Past Surgical History:  Procedure Laterality Date   COLONOSCOPY  07/02/2012   CYSTOSCOPY W/ RETROGRADES Right 05/10/2019   Procedure: CYSTOSCOPY WITH RETROGRADE PYELOGRAM;  Surgeon: Sondra Come, MD;  Location: [facility] ORS;  Service: Urology;  Laterality: Right;   CYSTOSCOPY/URETEROSCOPY/HOLMIUM LASER/STENT PLACEMENT Right 05/10/2019   Procedure: CYSTOSCOPY/URETEROSCOPY/HOLMIUM LASER/STENT PLACEMENT;  Surgeon: Sondra Come, MD;  Location: [facility] ORS;  Service: Urology;  Laterality: Right;   HPI:  Pt  is a 61 y.o. male with medical history significant for schizoaffective disorder, GERD, nephrolithiasis who was brought into the ER by EMS from the group home where he resides for evaluation of weakness, mental status changes and multiple falls, and Dehydration - Sodium 150.  Per EMR patient has been off his clozapine for several weeks and at baseline usually has a shuffling gait with difficulty speaking.  On the day of admission they noticed he was more lethargic and less responsive and had fallen out several times from his bed.  Chart review shows that he was recently hospitalized from 07/11 - 07/13 for suspected aspiration pneumonia -- however, CT of Chest revealed: "Emphysema bronchial thickening.  Retained mucus in the trachea.  3. Bilateral anterior rib fractures with callus formation,  suggesting this is subacute or remote.  4. Left lower lobe 4 mm subpleural  nodule.".  He was seen by speech therapy at the time and was placed on a dysphagia 3 diet then. In the ED, patient noted to have generalized tremors and bilateral lower extremity clonus.  He is lethargic and is unable to answer questions appropriately.  Also noted to have significant rigidity.  Psychiatry consulted d/t possible serotonin syndrome.     CT Head: no acute abnormality.  CXR this admit: No acute cardiopulmonary process.    OF NOTE: in 04/2021, pt was seen in the ED for " ED from group home after "choking" on a piece of steak" which was removed.    Assessment / Plan / Recommendation  Clinical Impression   Pt seen for BSE. Mostly nonverbal w/ few word responses -- mostly "water", "apple juice". Pt w/ stiff/rigid UEs but was able to hold the Cup to feed self. Pt on RA, afebrile.   Pt appears to present w/ adequate pharyngeal phase swallow function though in setting of apparent Cognitive decline and need for feeding and Cues for follow through w/ tasks. Pt exhibited min increased oral phase time and mastication time w/ increased textured food d/t Poor Dentition. ANY Cognitive decline and/or lack of sufficient Dentition for mastication can impact overall awareness/timing of swallow and safety during po tasks which increases risk for aspiration, choking. Pt has had an admit to the ED for "choking on piece of steak in 04/2021.  Pt's risk for aspiration can be reduced when following general aspiration precautions and using a modified diet consistency of broken down foods d/t very Poor Dentition status at baseline. He required min-mod verbal/visual cues for follow  through during po tasks and self-feeding.        Pt consumed several trials of ice chips, purees, minced solids and thin liquids via cup/straw w/ No overt clinical s/s of aspiration noted: no decline in vocal quality; no cough, and no decline in respiratory status during/post trials. O2 sats remained upper 90s. Oral phase was adequate for bolus  management and oral clearing of the boluses given. Mastication effort/time of softened solids was c/b need for Time and moistening of the foods, broken small. Pt attempted self-feeding but required Mod+ support and guidance d/t the Cognitive decline. He was able to hold his own Cup to drink liquids. This improves safety of swallowing. OM Exam appeared [facility] w/ No unilateral weakness noted. Some confusion of OM tasks and oral care noted. Hand over hand guidance and visual cue was helpful to initiate po care task.         In setting of apparent Cognitive decline and poor Dentition status, recommend initiation of the dysphagia level 2(MINCED foods moistened for ease of oral phase/mastication) w/ thin liquids; general aspiration and REFLUX precautions; reduce Distractions during meals and engage pt during meals for self-feeding. Pills WHOLE vs Crushed in Puree for safer swallowing as needed. Support w/ feeding at meals as needed. MD/NSG updated.  ST services recommends follow w/ Palliative Care for GOC and education re: impact of Cognitive decline/Dementia on swallowing. Suspect pt is close to/at his baseline per previous assessment. Precautions posted in room. NSG agreed. SLP Visit Diagnosis: Dysphagia, oral phase (R13.11) (pt has the presentation of Cognitive decline; GERD)    Aspiration Risk  Mild aspiration risk;Risk for inadequate nutrition/hydration (reduced when following general aspiration precautions)    Diet Recommendation   dysphagia level 2(MINCED foods moistened for ease of oral phase/mastication) w/ thin liquids; general aspiration and REFLUX precautions; reduce Distractions during meals and engage pt during meals for self-feeding. Support w/ feeding at meals as needed.   Medication Administration: Whole meds with puree (vs Crushed in Puree)    Other  Recommendations Recommended Consults:  (Dietician f/u) Oral Care Recommendations: Oral care BID;Oral care before and after PO;Staff/trained  caregiver to provide oral care Other Recommendations:  (n/a)    Recommendations for follow up therapy are one component of a multi-disciplinary discharge planning process, led by the attending physician.  Recommendations may be updated based on patient status, additional functional criteria and insurance authorization.  Follow up Recommendations No SLP follow up      Assistance Recommended at Discharge Intermittent Supervision/Assistance (feeding support)  Functional Status Assessment Patient has had a recent decline in their functional status and demonstrates the ability to make significant improvements in function in a reasonable and predictable amount of time.  Frequency and Duration min 2x/week  1 week       Prognosis Prognosis for Safe Diet Advancement: Fair Barriers to Reach Goals: Cognitive deficits;Language deficits;Time post onset;Severity of deficits;Behavior Barriers/Prognosis Comment: poor Dentition; apparent Cognitive decline      Swallow Study   General Date of Onset: 08/26/21 HPI: Pt  is a 61 y.o. male with medical history significant for schizoaffective disorder, GERD, nephrolithiasis who was brought into the ER by EMS from the group home where he resides for evaluation of weakness, mental status changes and multiple falls, and Dehydration - Sodium 150.  Per EMR patient has been off his clozapine for several weeks and at baseline usually has a shuffling gait with difficulty speaking.  On the day of admission they noticed he was more  lethargic and less responsive and had fallen out several times from his bed.  Chart review shows that he was recently hospitalized from 07/11 - 07/13 for suspected aspiration pneumonia -- however, CT of Chest revealed: "Emphysema bronchial thickening.  Retained mucus in the trachea.  3. Bilateral anterior rib fractures with callus formation,  suggesting this is subacute or remote.  4. Left lower lobe 4 mm subpleural nodule.".  He was seen by speech  therapy at the time and was placed on a dysphagia 3 diet then. In the ED, patient noted to have generalized tremors and bilateral lower extremity clonus.  He is lethargic and is unable to answer questions appropriately.  Also noted to have significant rigidity.  Psychiatry consulted d/t possible serotonin syndrome.     CT Head: no acute abnormality.  CXR this admit: No acute cardiopulmonary process.    OF NOTE: in 04/2021, pt was seen in the ED for " ED from group home after "choking" on a piece of steak" which was removed. Type of Study: Bedside Swallow Evaluation Previous Swallow Assessment: 04/2021, 07/2021 Diet Prior to this Study: Dysphagia 3 (soft);Thin liquids (unsure of what consistency at the group home) Temperature Spikes Noted: No Respiratory Status: Room air (wbc 11.5 declining) History of Recent Intubation: No Behavior/Cognition: Alert;Cooperative;Pleasant mood;Confused;Distractible;Requires cueing;Doesn't follow directions (baseline Cognitive issues; MOD+ cues) Oral Cavity Assessment: Dried secretions (sticky) Oral Care Completed by SLP: Yes Oral Cavity - Dentition: Poor condition;Missing dentition Vision: Functional for self-feeding (to hold his own cup to drink) Self-Feeding Abilities: Able to feed self;Needs assist;Needs set up;Total assist (can hold his own cup to drink) Patient Positioning: Upright in bed (needed positioning) Baseline Vocal Quality: Low vocal intensity (muttered) Volitional Cough: Cognitively unable to elicit Volitional Swallow: Unable to elicit    Oral/Motor/Sensory Function Overall Oral Motor/Sensory Function: Within functional limits (appeared grossly)   Ice Chips Ice chips: Within functional limits Presentation: Spoon (fed; 2 trials)   Thin Liquid Thin Liquid: Within functional limits Presentation: Self Fed;Straw (~12 ozs total) Other Comments: water, juice    Nectar Thick Nectar Thick Liquid: Not tested   Honey Thick Honey Thick Liquid: Not tested    Puree Puree: Within functional limits Presentation: Spoon (fed; 10 trials)   Solid     Solid: Impaired (mastication, poor Dentition) Presentation: Spoon (fed; 8 trials) Oral Phase Impairments: Impaired mastication Oral Phase Functional Implications: Impaired mastication Pharyngeal Phase Impairments:  (none) Other Comments: poor Dentition        Jerilynn Som, MS, CCC-SLP Speech Language Pathologist Rehab Services; [facility] -  [phone] (ascom) Abrahm Mancia 08/27/2021,2:28 PM

## 2021-08-27 NOTE — Consult Note (Signed)
NAME:  Christopher Lesser., MRN:  [id number], DOB:  01-Dec-1960, LOS: 1 ADMISSION DATE:  08/26/2021, CONSULTATION DATE:  08/26/21 REFERRING MD:  Joylene Igo, CHIEF COMPLAINT:  AMS   History of Present Illness:  61 year old man with hx of schizophrenia, recurrent aspiration presenting with recurrent falls and tremors.  The only question he answered for me was that he was thirsty.  He has hypernatremia, mild acute kidney injury, and elevated CK.  CT head and LP done in ER neg.  PCCM asked to assist with workup.  Again, history extremely limited due to patient mutism.  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 rate 16, height 6' (1.829 m), weight 61.4 kg, SpO2 95 %.        Intake/Output Summary (Last 24 hours) at 08/27/2021 0709 Last data filed at 08/27/2021 0130 Gross per 24 hour  Intake 1650.15 ml  Output 1050 ml  Net 600.15 ml   Filed Weights   08/26/21 0753 08/26/21 1435  Weight: 66 kg 61.4 kg      Review of Systems: UNABLE TO OBTAIN DUE TO NONVERBAL NATURE Other:  All other systems negative   Physical Examination:   General Appearance: No distress  temporal wasting EYES PERRLA, EOM intact.   NECK Supple, No JVD Pulmonary: normal breath sounds, No wheezing.  CardiovascularNormal S1,S2.  No m/r/g.   Abdomen: Benign, Soft, non-tender. Skin:   warm, no rashes, no ecchymosis  Extremities: normal, no cyanosis, clubbing. Muscle wasting Neuro:without focal findings,  nonverbal cogwheeling rigidity in all 4 ext, occasional resting tremors     Resolved Hospital Problem list   N/A  Assessment & Plan:  61 yo WM with AMS,  parkinsonian rigidity, elevated CK-  HSV6 encephalitis Follow up ID, NEURO RECS Patient does not need ICU status or SD status at this time DNR/DMI STATUS  PROGNOSIS SEEMS POOR WILL OBTAIN PALLIATIVE CARE CONSULTATION TRANSFER TO GEN MED FLOOR  PCCM TO SIGN OFF  Best Practice (right click and "Reselect all SmartList Selections" daily)   Per primary  Labs   CBC: Recent Labs  Lab 08/26/21 0755 08/27/21 0421  WBC 19.9* 11.5*  NEUTROABS 17.5*  --   HGB 13.1 10.7*  HCT 40.6 33.6*  MCV 89.8 88.9  PLT 298 225     Basic Metabolic Panel: Recent Labs  Lab 08/26/21 0755 08/27/21 0421  NA 150* 147*  K 3.7 3.3*  CL 115* 116*  CO2 22 25  GLUCOSE 104* 140*  BUN 44* 36*  CREATININE 1.23 0.89  CALCIUM 10.3 9.0  MG 2.3  --     GFR: Estimated Creatinine Clearance: 74.7 mL/min (by C-G formula based on SCr of 0.89 mg/dL). Recent Labs  Lab 08/26/21 0755 08/26/21 0931 08/26/21 1036 08/27/21 0421  PROCALCITON <0.10  --   --   --   WBC 19.9*  --   --  11.5*  LATICACIDVEN  --  1.7 1.0  --      Liver Function Tests: Recent Labs  Lab 08/26/21 0755  AST 76*  ALT 33  ALKPHOS 107  BILITOT 1.2  PROT 7.8  ALBUMIN 4.6    No results  for input(s): "LIPASE", "AMYLASE" in the last 168 hours. Recent Labs  Lab 08/26/21 0755  AMMONIA 37*     ABG    Component Value Date/Time   HCO3 28.4 (H) 07/20/2021 2135   O2SAT 84 07/20/2021 2135     Coagulation Profile: Recent Labs  Lab 08/26/21 0755  INR 1.0     Cardiac Enzymes: Recent Labs  Lab 08/26/21 0755 08/27/21 0421  CKTOTAL 4,006* 9,278*     HbA1C: Hgb A1c MFr Bld  Date/Time Value Ref Range Status  09/30/2020 02:39 PM 5.2 <5.7 % of total Hgb Final    Comment:    For the purpose of screening for the presence of diabetes: . <5.7%       Consistent with the absence of diabetes 5.7-6.4%    Consistent with increased risk for diabetes             (prediabetes) > or =6.5%  Consistent with diabetes . This  assay result is consistent with a decreased risk of diabetes. . Currently, no consensus exists regarding use of hemoglobin A1c for diagnosis of diabetes in children. . According to American Diabetes Association (ADA) guidelines, hemoglobin A1c <7.0% represents optimal control in non-pregnant diabetic patients. Different metrics may apply to specific patient populations.  Standards of Medical Care in Diabetes(ADA). Marland Kitchen   10/01/2019 02:06 PM 5.6 <5.7 % of total Hgb Final    Comment:    For the purpose of screening for the presence of diabetes: . <5.7%       Consistent with the absence of diabetes 5.7-6.4%    Consistent with increased risk for diabetes             (prediabetes) > or =6.5%  Consistent with diabetes . This assay result is consistent with a decreased risk of diabetes. . Currently, no consensus exists regarding use of hemoglobin A1c for diagnosis of diabetes in children. . According to American Diabetes Association (ADA) guidelines, hemoglobin A1c <7.0% represents optimal control in non-pregnant diabetic patients. Different metrics may apply to specific patient populations.  Standards of Medical Care in Diabetes(ADA). .     CBG: Recent Labs  Lab 08/26/21 1456  GLUCAP 110*       Alizza Sacra Santiago Glad, M.D.  [name]  Medical Director [facility] Medical Director [facility] Cardio-Pulmonary Department

## 2021-08-27 NOTE — Progress Notes (Addendum)
0800 Alert but very shy. Answers limited questions. Repeats what he wants continuously.  More comfortable with females. Ate small amount of food. Drank 500 mls for breakfast. 1100 Sisters in to visit.  1200 Refused to eat any lunch. Drank 250 of water. 1430 Transferred to 119 via bed.

## 2021-08-27 NOTE — Assessment & Plan Note (Addendum)
Likely multifactorial.  Discussed with ID, neurology and reviewed psychiatry input EEG did not show any seizure activity Unlikely neuroleptic malignant or serotonin syndrome CSF positive for HHV-6 but per discussion with ID likely not significant Cannot rule out paraneoplastic syndrome and or autoimmune encephalitis considering significant weight loss of 30-40 pounds since January Neurology considering MRI of the brain tonight

## 2021-08-27 NOTE — Plan of Care (Signed)

## 2021-08-28 DIAGNOSIS — R29898 Other symptoms and signs involving the musculoskeletal system: Secondary | ICD-10-CM | POA: Diagnosis not present

## 2021-08-28 DIAGNOSIS — G9341 Metabolic encephalopathy: Secondary | ICD-10-CM | POA: Diagnosis not present

## 2021-08-28 DIAGNOSIS — F258 Other schizoaffective disorders: Secondary | ICD-10-CM

## 2021-08-28 DIAGNOSIS — G2579 Other drug induced movement disorders: Secondary | ICD-10-CM | POA: Diagnosis not present

## 2021-08-28 LAB — CBC
HCT: 35.2 % — ABNORMAL LOW (ref 39.0–52.0)
Hemoglobin: 11.5 g/dL — ABNORMAL LOW (ref 13.0–17.0)
MCH: 28.3 pg (ref 26.0–34.0)
MCHC: 32.7 g/dL (ref 30.0–36.0)
MCV: 86.7 fL (ref 80.0–100.0)
Platelets: 245 10*3/uL (ref 150–400)
RBC: 4.06 MIL/uL — ABNORMAL LOW (ref 4.22–5.81)
RDW: 15.5 % (ref 11.5–15.5)
WBC: 10.9 10*3/uL — ABNORMAL HIGH (ref 4.0–10.5)
nRBC: 0 % (ref 0.0–0.2)

## 2021-08-28 LAB — BASIC METABOLIC PANEL
Anion gap: 7 (ref 5–15)
BUN: 20 mg/dL (ref 8–23)
CO2: 24 mmol/L (ref 22–32)
Calcium: 8.9 mg/dL (ref 8.9–10.3)
Chloride: 109 mmol/L (ref 98–111)
Creatinine, Ser: 0.66 mg/dL (ref 0.61–1.24)
GFR, Estimated: >60 mL/min (ref >60)
Glucose, Bld: 120 mg/dL — ABNORMAL HIGH (ref 70–99)
Potassium: 3.1 mmol/L — ABNORMAL LOW (ref 3.5–5.1)
Sodium: 140 mmol/L (ref 135–145)

## 2021-08-28 LAB — CK: Total CK: 9254 U/L — ABNORMAL HIGH (ref 49–397)

## 2021-08-28 LAB — MAGNESIUM: Magnesium: 1.7 mg/dL (ref 1.7–2.4)

## 2021-08-28 LAB — GLUCOSE, CAPILLARY: Glucose-Capillary: 149 mg/dL — ABNORMAL HIGH (ref 70–99)

## 2021-08-28 LAB — HIV ANTIBODY (ROUTINE TESTING W REFLEX): HIV Screen 4th Generation wRfx: NONREACTIVE

## 2021-08-28 MED ORDER — ORAL CARE MOUTH RINSE
15.0000 mL | OROMUCOSAL | Status: DC
  Administered 2021-08-28 – 2021-08-30 (×12): 15 mL via OROMUCOSAL

## 2021-08-28 MED ORDER — BOOST / RESOURCE BREEZE PO LIQD CUSTOM
1.0000 | Freq: Three times a day (TID) | ORAL | Status: DC
  Administered 2021-08-28 – 2021-09-02 (×13): 1 via ORAL

## 2021-08-28 MED ORDER — DEXTROSE IN LACTATED RINGERS 5 % IV SOLN: INTRAVENOUS | Status: DC

## 2021-08-28 MED ORDER — ORAL CARE MOUTH RINSE: 15.0000 mL | OROMUCOSAL | Status: DC | PRN

## 2021-08-28 MED ORDER — POTASSIUM CHLORIDE 10 MEQ/100ML IV SOLN
10.0000 meq | INTRAVENOUS | Status: AC
  Administered 2021-08-28 (×4): 10 meq via INTRAVENOUS
  Filled 2021-08-28 (×4): qty 100

## 2021-08-28 NOTE — Progress Notes (Signed)
Speech Language Pathology Treatment: Dysphagia  Patient Details Name: Christopher Valdez. MRN: [id number] DOB: [birth year] Today's Date: 08/28/2021 Time: 1250-1330 SLP Time Calculation (min) (ACUTE ONLY): 40 min  Assessment / Plan / Recommendation Clinical Impression  Pt seen today for ongoing toleration of Minced foods diet; NSG reported oral holding this morning during breakfast meal but timely drinking of thin liquids w/ no overt s/s of aspiration. Pt continues to engage at the word level requesting drinks; "no" when offered some foods. Reviewed labs, chart notes.  Currently, pt is rigid w/ tremors of UEs when reaching forward. Though he was able to reach for his Cup to feed self various drinks. OF NOTE: after he reaches for/holding the cup, he pauses then asks for "help" to "bring it to my mouth" -- suspect hesitation of initiation of his motor planning.  Per Psychiatry, Patient has a history of schizoaffective disorder it appears that he has been maintained on clozapine and Depakote as most notable agents. But is is unclear whether he has still been on all of his medicines.   Pt consumed several trials of thin liquids via straw(~24 ozs) and few tsp trials of puree(accepted few only) w/ No overt clinical s/s of aspiration noted: no decline in vocal quality; no cough, and no decline in respiratory status during/post trials. O2 sats remained upper 90s. Oral phase was adequate for bolus management and oral clearing of the majority of boluses given. Minimal oral movements for bolus management overall. Timely transfer and swallowing of liquids noted. However, after ~4-5 trials of puree, he held the next bolus orally and did not swallow immediately. NSG reported oral holding of eggs and other minced foods this morning, then purees. Will downgrade diet to puree secondary to the oral phase deficits -- suspect impact from Cognitive decline and lack of desire for foods as mastication was noted at evaluation. Pt  has been on a modified diet consistency in the past.  Pt attempted self-drinking but required Mod support and guidance d/t the Cognitive and motor planning decline. He was able to hold his own Cup to drink liquids. This improves safety of swallowing. Hand over hand guidance and visual cue was helpful to initiate po care task.         In setting of apparent Cognitive and motor planning decline and poor Dentition status, recommend modifying diet to a Puree consistency w/ foods moistened for ease of oral phase management and flavor; thin liquids. General aspiration and REFLUX precautions; reduce Distractions during meals and engage pt during meals for self-feeding -- support as needed for follow through w/ self-feeding (give support to complete motor planning task of bringing cup to his mouth). Pills Crushed in Puree for safer swallowing as needed. Support w/ feeding at meals. MD/NSG updated.  ST services recommends follow w/ Palliative Care for [place] and education re: impact of Cognitive decline on swallowing and oral intake overall. Precautions posted in room. NSG agreed. MD to reconsult if Cognitive/medical status' improves to indicate appropriateness to upgrade diet consistency.     HPI HPI: Pt  is a 61 y.o. male with medical history significant for schizoaffective disorder, GERD, nephrolithiasis who was brought into the ER by EMS from the group home where he resides for evaluation of weakness, mental status changes and multiple falls, and Dehydration - Sodium 150.  Per EMR patient has been off his clozapine for several weeks and at baseline usually has a shuffling gait with difficulty speaking.  On the day of admission they  noticed he was more lethargic and less responsive and had fallen out several times from his bed.  Chart review shows that he was recently hospitalized from 07/11 - 07/13 for suspected aspiration pneumonia -- however, CT of Chest revealed: "Emphysema bronchial thickening.  Retained mucus  in the trachea.  3. Bilateral anterior rib fractures with callus formation,  suggesting this is subacute or remote.  4. Left lower lobe 4 mm subpleural nodule.".  He was seen by speech therapy at the time and was placed on a dysphagia 3 diet then. In the ED, patient noted to have generalized tremors and bilateral lower extremity clonus.  He is lethargic and is unable to answer questions appropriately.  Also noted to have significant rigidity.  Psychiatry consulted d/t possible serotonin syndrome.     CT Head: no acute abnormality.  CXR this admit: No acute cardiopulmonary process.    OF NOTE: in 04/2021, pt was seen in the ED for " ED from group home after "choking" on a piece of steak" which was removed.      SLP Plan  All goals met      Recommendations for follow up therapy are one component of a multi-disciplinary discharge planning process, led by the attending physician.  Recommendations may be updated based on patient status, additional functional criteria and insurance authorization.    Recommendations  Diet recommendations: Dysphagia 1 (puree);Thin liquid Liquids provided via: Cup;Straw (monitor) Medication Administration: Crushed with puree Supervision: Patient able to self feed;Staff to assist with self feeding;Full supervision/cueing for compensatory strategies (pt can hold cup) Compensations: Minimize environmental distractions;Slow rate;Small sips/bites;Follow solids with liquid Postural Changes and/or Swallow Maneuvers: Out of bed for meals;Seated upright 90 degrees;Upright 30-60 min after meal                General recommendations:  (Dietician f/u) Oral Care Recommendations: Oral care BID;Oral care before and after PO;Staff/trained caregiver to provide oral care Follow Up Recommendations: No SLP follow up Assistance recommended at discharge: Intermittent Supervision/Assistance (for feeding support) SLP Visit Diagnosis: Dysphagia, oral phase (R13.11) (suspect Cognitive  decline; GERD) Plan: All goals met             Christopher Kenner, MS, CCC-SLP Speech Language Pathologist Rehab Services; [place] [phone] (ascom)  Faust Thorington  08/28/2021, 2:38 PM

## 2021-08-28 NOTE — Evaluation (Signed)
Occupational Therapy Evaluation Patient Details Name: Christopher Valdez. MRN: [id number] DOB: May 18, 1960 Today's Date: 08/28/2021   History of Present Illness Pt is a 61 y/o M admitted on 08/26/21 after presenting with c/o generalized weakness, AMS, & falls. Pt is being treated for acute metabolic encephalopathy, likely multifactoral. PMH: schizoaffective disorder, GERD, nephrolithiasis   Clinical Impression   Christopher Valdez presents with generalized weakness, impaired balance, AMS, and inability to communicate. He has been living at a group home where, according to sister, pt has been able to feed, bathe, & dress himself and has ambulated without AD. During today's evaluation, pt is able to communicate minimally, able to state his first name. He does say that he needs to have a BM and responds to a few other questions, but is largely non-verbal. He does not respond to repeated questions re: pain or discomfort. He has UE and LE tremors, extensive tone throughout b/l LE, w/ limited ankle dorsiflexion and knee ROM. He responds to verbal and visual requests to move b/l UE, w/ elbow flexion to ~ 90* and shoulder flexion to ~ 45*. Pt requires Max A for rolling in bed, can grab and hold onto bedrails only with hand-over-hand assist. Total A for pericare following BM. Pt is able to indicate that he is thirsty, requires hand-over-hand assist to hold cup and bring to mouth; pt is intermittently able to suck liquid through straw, frequently attempting to blow into straw instead of sucking, unable to self-correct with verbal cueing. Recommend ongoing OT during hospitalization, with DC to SNF, given pt's high level of care needs and significant difference between current presentation and PLOF.     Recommendations for follow up therapy are one component of a multi-disciplinary discharge planning process, led by the attending physician.  Recommendations may be updated based on patient status, additional functional criteria  and insurance authorization.   Follow Up Recommendations  Skilled nursing-short term rehab (<3 hours/day)    Assistance Recommended at Discharge Frequent or constant Supervision/Assistance  Patient can return home with the following A lot of help with walking and/or transfers;A lot of help with bathing/dressing/bathroom;Assistance with cooking/housework;Direct supervision/assist for medications management;Assist for transportation;Help with stairs or ramp for entrance;Direct supervision/assist for financial management;Assistance with feeding    Functional Status Assessment  Patient has had a recent decline in their functional status and demonstrates the ability to make significant improvements in function in a reasonable and predictable amount of time.  Equipment Recommendations  None recommended by OT    Recommendations for Other Services       Precautions / Restrictions Precautions Precautions: Fall Restrictions Weight Bearing Restrictions: No      Mobility Bed Mobility Overal bed mobility: Needs Assistance Bed Mobility: Rolling Rolling: +2 for physical assistance, Max assist         General bed mobility comments: Max-Total A for rolling in bed to position on and off bedpan.    Transfers                   General transfer comment: unsafe to attempt      Balance Overall balance assessment: Needs assistance   Sitting balance-Leahy Scale: Zero       Standing balance-Leahy Scale: Zero                             ADL either performed or assessed with clinical judgement   ADL Overall ADL's : Needs assistance/impaired Eating/Feeding: Maximal assistance  Eating/Feeding Details (indicate cue type and reason): Unable to bring cup to mouth, unable to suck through straw                         Toileting- Clothing Manipulation and Hygiene: Total assistance;Bed level Toileting - Clothing Manipulation Details (indicate cue type and reason):  Total A for pericare following BM on bedpan.     Functional mobility during ADLs: Maximal assistance;Total assistance       Vision         Perception     Praxis      Pertinent Vitals/Pain Pain Assessment Pain Assessment: PAINAD Breathing: occasional labored breathing, short period of hyperventilation Negative Vocalization: occasional moan/groan, low speech, negative/disapproving quality Facial Expression: sad, frightened, frown Body Language: tense, distressed pacing, fidgeting Consolability: distracted or reassured by voice/touch PAINAD Score: 5 Pain Location: Pt unable to verbalize or indicate Pain Descriptors / Indicators: Grimacing Pain Intervention(s): Repositioned, Utilized relaxation techniques     Hand Dominance     Extremity/Trunk Assessment Upper Extremity Assessment Upper Extremity Assessment: Difficult to assess due to impaired cognition;Generalized weakness   Lower Extremity Assessment Lower Extremity Assessment: Generalized weakness;Difficult to assess due to impaired cognition       Communication Communication Communication: Expressive difficulties   Cognition Arousal/Alertness: Awake/alert, Lethargic Behavior During Therapy: Flat affect Overall Cognitive Status: Impaired/Different from baseline                   Orientation Level: Disoriented to, Place, Situation     Following Commands: Follows one step commands inconsistently, Follows one step commands with increased time     Problem Solving: Slow processing, Decreased initiation, Requires verbal cues, Requires tactile cues General Comments: Largely non-verbal. Requires greatly increased time to respond to any questions. Responds < 25% of time     General Comments  UE and LE tremors    Exercises Other Exercises Other Exercises: Cognitive re-orientation, encouraged relaxation, repositioned for comfort   Shoulder Instructions      Home Living Family/patient expects to be  discharged to:: Group home                                 Additional Comments: Pt unable to provide any details re: living situation and PLOF. Information listed here comes from chart review and conversation with pt's sister.      Prior Functioning/Environment Prior Level of Function : Needs assist             Mobility Comments: Per sister Manuela Schwartz) pt was ambulatory without AD but has experienced a major decline recently where he's had multiple falls, more shuffled gait, worsening tremors & worsening cognition. ADLs Comments: Per sister, pt was able to feed, bathe, & dress himself at group home. Has been working 1 day per week.        OT Problem List: Decreased strength;Decreased range of motion;Decreased activity tolerance;Decreased coordination;Impaired UE functional use;Decreased cognition;Impaired balance (sitting and/or standing);Impaired tone      OT Treatment/Interventions: Self-care/ADL training;Therapeutic exercise;Patient/family education;Balance training;Therapeutic activities;DME and/or AE instruction    OT Goals(Current goals can be found in the care plan section) Acute Rehab OT Goals Patient Stated Goal: pt unable to state goal OT Goal Formulation: With patient Time For Goal Achievement: 09/11/21 Potential to Achieve Goals: Good ADL Goals Pt Will Perform Grooming: with supervision;standing;sitting (in sitting or standing) Pt Will Perform Upper Body Dressing: with supervision;sitting  Pt Will Transfer to Toilet: with supervision;ambulating (using LRAD)  OT Frequency: Min 2X/week    Co-evaluation              AM-PAC OT "6 Clicks" Daily Activity     Outcome Measure Help from another person eating meals?: A Lot Help from another person taking care of personal grooming?: A Lot Help from another person toileting, which includes using toliet, bedpan, or urinal?: Total Help from another person bathing (including washing, rinsing, drying)?: A  Lot Help from another person to put on and taking off regular upper body clothing?: A Lot Help from another person to put on and taking off regular lower body clothing?: Total 6 Click Score: 10   End of Session Nurse Communication: Mobility status  Activity Tolerance: Treatment limited secondary to medical complications (Comment) Patient left: in bed;with call bell/phone within reach  OT Visit Diagnosis: Other abnormalities of gait and mobility (R26.89);Other symptoms and signs involving cognitive function;Other symptoms and signs involving the nervous system (R29.898);Muscle weakness (generalized) (M62.81);Feeding difficulties (R63.3);Cognitive communication deficit (R41.841)                Time: 9323-5573 OT Time Calculation (min): 23 min Charges:  OT General Charges $OT Visit: 1 Visit OT Evaluation $OT Eval High Complexity: 1 High OT Treatments $Self Care/Home Management : 23-37 mins Latina Craver, PhD, MS, OTR/L 08/28/21, 4:49 PM

## 2021-08-28 NOTE — Consult Note (Signed)
NEUROLOGY CONSULTATION NOTE   Date of service: August 28, 2021 Patient Name: Christopher Valdez. MRN:  [id number] DOB:  08-13-1960 Reason for consult: enecephalopathy, rigidity Requesting physician: Dr. Maurilio Lovely _ _ _   _ __   _ __ _ _  __ __   _ __   __ _  History of Present Illness   61 yo man with hx schizoaffective disorder admitted from group home for generalized weakness, worsening encephalopathy, and multiple falls. Per family collateral information provided to other providers, patient has lived in group home for years but was somewhat independent till about 6-12 mos ago, driving himself on errands and doing other things for himself. For about the past 6 mos he has become progressively more confused, frequent falls, unintensional 40 lb weight loss. He is a longtime smoker. CT c/a/p Jul 2023 showed no e/o malignancy. He was admitted with acute worsening of his encephalopathy without identifiable source. 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 He is extremely rigid. His clozapine was stopped 2 weeks ago. He is not currently on a neuroleptic and the only serotonergic medication he is currently on is zofran.   ROS   UTA 2/2 encephalopathy  Past History   I have reviewed the following:  Past Medical History:  Diagnosis Date   Calculus of kidney    Chronic constipation    Dyslipidemia    GERD (gastroesophageal reflux disease)    Proteinuria    Schizo-affective schizophrenia (HCC)    Vitamin D deficiency    Past Surgical History:  Procedure Laterality Date   COLONOSCOPY  07/02/2012   CYSTOSCOPY W/ RETROGRADES Right 05/10/2019   Procedure: CYSTOSCOPY WITH RETROGRADE PYELOGRAM;  Surgeon: Sondra Come, MD;  Location: [facility] ORS;  Service: Urology;  Laterality: Right;   CYSTOSCOPY/URETEROSCOPY/HOLMIUM LASER/STENT PLACEMENT Right 05/10/2019   Procedure: CYSTOSCOPY/URETEROSCOPY/HOLMIUM LASER/STENT PLACEMENT;  Surgeon: Sondra Come, MD;  Location: [facility]  ORS;  Service: Urology;  Laterality: Right;   Family History  Problem Relation Age of Onset   Parkinson's disease Mother    Deep vein thrombosis Father    Osteoarthritis Father    Social History   Socioeconomic History   Marital status: Single    Spouse name: Not on file   Number of children: 0   Years of education: Not on file   Highest education level: High school graduate  Occupational History   Occupation: [employer]  Tobacco Use   Smoking status: Every Day    Packs/day: 1.00    Years: 40.00    Total pack years: 40.00    Types: Cigarettes   Smokeless tobacco: Never  Vaping Use   Vaping Use: Never used  Substance and Sexual Activity   Alcohol use: No    Alcohol/week: 0.0 standard drinks of alcohol   Drug use: No   Sexual activity: Never  Other Topics Concern   Not on file  Social History Narrative   He lives in a group home and works one day per week at [employer]   Financial Resource Strain: Low Risk  (04/13/2021)   Overall Financial Resource Strain (CARDIA)    Difficulty of Paying Living Expenses: Not very hard  Food Insecurity: No Food Insecurity (04/13/2021)   Hunger Vital Sign    Worried About Running Out of Food in the Last Year: Never true    Ran Out of Food in the Last Year: Never  true  Transportation Needs: No Transportation Needs (04/13/2021)   PRAPARE - Administrator, Civil Service (Medical): No    Lack of Transportation (Non-Medical): No  Physical Activity: Inactive (04/13/2021)   Exercise Vital Sign    Days of Exercise per Week: 0 days    Minutes of Exercise per Session: 0 min  Stress: No Stress Concern Present (04/13/2021)   [employer] of Occupational Health - Occupational Stress Questionnaire    Feeling of Stress : Not at all  Social Connections: Socially Isolated (04/13/2021)   Social Connection and Isolation Panel [NHANES]    Frequency of Communication with Friends and Family: More than three times a week     Frequency of Social Gatherings with Friends and Family: Twice a week    Attends Religious Services: Never    Database administrator or Organizations: No    Attends Engineer, structural: Never    Marital Status: Never married   Allergies  Allergen Reactions   Tall Ragweed Other (See Comments)    Pollen    Medications   Medications Prior to Admission  Medication Sig Dispense Refill Last Dose   Cholecalciferol (VITAMIN D3) 50 MCG (2000 UT) capsule TAKE 1 CAPSULE BY MOUTH DAILY 30 capsule 12 08/26/2021 at 0600   divalproex (DEPAKOTE ER) 500 MG 24 hr tablet Take 500 mg by mouth at bedtime.    08/25/2021 at 2100   FLUoxetine (PROZAC) 40 MG capsule Take 40 mg by mouth daily.   08/26/2021 at 0600   INGREZZA 80 MG capsule Take 80 mg by mouth daily.   08/26/2021 at 0600   omeprazole (PRILOSEC) 40 MG capsule TAKE 1 CAPSULE BY MOUTH DAILY (Patient taking differently: Take 40 mg by mouth daily.) 30 capsule 5 08/26/2021 at 0600   polycarbophil (FIBERCON) 625 MG tablet Take 625 mg by mouth daily.   08/26/2021 at 0600   simvastatin (ZOCOR) 20 MG tablet TAKE 1 TABLET BY MOUTH DAILY (Patient taking differently: Take 20 mg by mouth daily at 6 PM.) 30 tablet 5 08/25/2021 at 1700   umeclidinium-vilanterol (ANORO ELLIPTA) 62.5-25 MCG/ACT AEPB Inhale 1 puff into the lungs daily at 6 (six) AM. 1 each 5 08/26/2021 at 0600   acetaminophen (TYLENOL) 500 MG tablet Take 1,000 mg by mouth every 4 (four) hours as needed for mild pain or headache.    prn at prn   albuterol (VENTOLIN HFA) 108 (90 Base) MCG/ACT inhaler Inhale 2 puffs into the lungs every 6 (six) hours as needed for wheezing or shortness of breath. 8 g 2 prn at prn   cloZAPine (CLOZARIL) 100 MG tablet Take 2 tablets (200 mg total) by mouth 2 (two) times daily for 5 days. 20 tablet 0 08/21/2021 at 2100      Current Facility-Administered Medications:    Chlorhexidine Gluconate Cloth 2 % PADS 6 each, 6 each, Topical, Daily, Agbata, Tochukwu, MD, 6 each at  08/27/21 1609   dextrose 5 %-0.45 % sodium chloride infusion, , Intravenous, Continuous, Agbata, Tochukwu, MD, Last Rate: 150 mL/hr at 08/28/21 0454, Infusion Verify at 08/28/21 0454   enoxaparin (LOVENOX) injection 40 mg, 40 mg, Subcutaneous, Q24H, Agbata, Tochukwu, MD, 40 mg at 08/27/21 2209   ipratropium-albuterol (DUONEB) 0.5-2.5 (3) MG/3ML nebulizer solution 3 mL, 3 mL, Nebulization, Q6H PRN, Agbata, Tochukwu, MD   LORazepam (ATIVAN) injection 2 mg, 2 mg, Intravenous, Q2H PRN, Agbata, Tochukwu, MD, 2 mg at 08/26/21 1222   nicotine (NICODERM CQ - dosed in mg/24 hours) patch  21 mg, 21 mg, Transdermal, Daily, Sherryll Burger, Vipul, MD, 21 mg at 08/27/21 1609   ondansetron (ZOFRAN) tablet 4 mg, 4 mg, Oral, Q6H PRN **OR** ondansetron (ZOFRAN) injection 4 mg, 4 mg, Intravenous, Q6H PRN, Agbata, Tochukwu, MD   Oral care mouth rinse, 15 mL, Mouth Rinse, 4 times per day, Delfino Lovett, MD   Oral care mouth rinse, 15 mL, Mouth Rinse, PRN, Delfino Lovett, MD  Vitals   Vitals:   08/27/21 1644 08/27/21 2205 08/28/21 0047 08/28/21 0620  BP: (!) 167/63 (!) 150/67 (!) 153/75 (!) 171/77  Pulse: 62 69 65 83  Resp:  18 16 20   Temp: 98.6 F (37 C) 98.2 F (36.8 C) 97.8 F (36.6 C) 98.8 F (37.1 C)  TempSrc: Oral Oral    SpO2: 98% 98% 98% 96%  Weight:      Height:         Body mass index is 18.36 kg/m.  Physical Exam   Physical Exam Gen: alert, oriented to self and [place] [facility], able to follow some simple commands Resp: normal WOB CV: RRR  Neuro: *MS: alert, oriented to self and [place] [facility], able to follow some simple commands *Speech: moderate dysarthria, impaired naming and repetition *CN: PERRL, blinks to threat bilat, EOMI, sensation intact, face symmetric at rest, hearing intact to voice *Motor: moderate leadpipe ridity BUE>BLE, symmetric. Antigravity BUE, some movement but not against gravity BLE, all symmetric *Sensory: SILT *Reflexes: 3+ throughout except 4+ bilat achilles    Labs    CBC:  Recent Labs  Lab 08/26/21 0755 08/27/21 0421 08/28/21 0630  WBC 19.9* 11.5* 10.9*  NEUTROABS 17.5*  --   --   HGB 13.1 10.7* 11.5*  HCT 40.6 33.6* 35.2*  MCV 89.8 88.9 86.7  PLT 298 225 245    Basic Metabolic Panel:  Lab Results  Component Value Date   NA 140 08/28/2021   K 3.1 (L) 08/28/2021   CO2 24 08/28/2021   GLUCOSE 120 (H) 08/28/2021   BUN 20 08/28/2021   CREATININE 0.66 08/28/2021   CALCIUM 8.9 08/28/2021   GFRNONAA >60 08/28/2021   GFRAA 97 10/01/2019   Lipid Panel:  Lab Results  Component Value Date   LDLCALC 99 09/30/2020   HgbA1c:  Lab Results  Component Value Date   HGBA1C 5.2 09/30/2020   Urine Drug Screen:     Component Value Date/Time   LABOPIA NONE DETECTED 08/26/2021 1529   COCAINSCRNUR NONE DETECTED 08/26/2021 1529   LABBENZ POSITIVE (A) 08/26/2021 1529   AMPHETMU NONE DETECTED 08/26/2021 1529   THCU NONE DETECTED 08/26/2021 1529   LABBARB NONE DETECTED 08/26/2021 1529    Alcohol Level No results found for: "ETH"   Impression   61 yo man with hx schizoaffective disorder admitted from group home for generalized weakness, worsening encephalopathy, and multiple falls. Exam notable for severe rigidity and hyperreflexia. I do not feel this is medication effect; he is not currently on a neuroleptic nor multiple serotonergic medications, and he has neither been tachycardic nor hyperthermic. Will order brain MRI wwo contrast tonight for further evaluation. ID favors HHV6 in CSF to be a red herring. Neurosyphilis may be a possibility, will order RPR in serum and if positive consider retapping for VDRL. Will send paraneoplastic workup on CSF and serum given smoking hx and 40 lb weight loss over past 6 mos  Recommendations   - MRI brain wwo contrast - Serum RPR - Paraneoplastic panels on serum and CSF - Will talk to family for  additional collateral hx - Will continue to  follow ______________________________________________________________________   Thank you for the opportunity to take part in the care of this patient. If you have any further questions, please contact the neurology consultation attending.  Signed,  Bing Neighbors, MD [facility] [phone]  If 7pm- 7am, please page neurology on call as listed in AMION.

## 2021-08-28 NOTE — Progress Notes (Signed)
PROGRESS NOTE    Christopher Valdez.  DXA:[id number] DOB: 04/26/1960 DOA: 08/26/2021 PCP: Alba Cory, MD   Brief Narrative:  61 y.o. male with medical history significant for schizoaffective disorder, GERD, nephrolithiasis who was sent over from group home for evaluation of generalized weakness, altered mental status and multiple falls.  8/17: Psychiatry, ID consult, stepdown status 8/18: Neurology consult, EEG showed no seizure, moving out to floors 8/19: Patient continues to remain altered and further work-up per neurology pending.  CK levels continue to remain elevated and patient remains on IV fluid.     Assessment & Plan:   Active Problems:   Other schizoaffective disorders (HCC)   Tobacco use   COPD (chronic obstructive pulmonary disease) (HCC)   Protein-calorie malnutrition, severe (HCC)   Hypernatremia   Rhabdomyolysis   Acute metabolic encephalopathy  Assessment and Plan:   Acute metabolic encephalopathy-ongoing Likely multifactorial.  Discussed with ID, neurology and reviewed psychiatry input EEG did not show any seizure activity Unlikely neuroleptic malignant or serotonin syndrome CSF positive for HHV-6 but per discussion with ID likely not significant Cannot rule out paraneoplastic syndrome and or autoimmune encephalitis considering significant weight loss of 30-40 pounds since January Brain MRI negative for any acute findings 8/18 Paraneoplastic panel ordered on CSF per neurology with further evaluation and recommendations pending     Rhabdomyolysis Continue aggressive IV fluid hydration.  He has had multiple falls which could be the etiology Hold statins Check daily CK levels.  His CK is still trending up which is worrisome [id number]   Hypernatremia-resolved Continue IV hydration.  Sodium is improving, monitor   Protein-calorie malnutrition, severe (HCC) Complicates overall prognosis.  Per his sister he has had about 30 to 40 pound weight loss since  January of this year.  She reports significant clinical and functional decline over last six 1 to 1-year   COPD (chronic obstructive pulmonary disease) (HCC) Not acutely exacerbated  as needed bronchodilator therapy.   Tobacco use Counseled.  Nicotine patch ordered   Other schizoaffective disorders (HCC) Hold all antipsychotic agents for now.  Seen by psychiatry.  Currently on as needed Ativan  Hypokalemia Replete and reevaluate in a.m.    DVT prophylaxis:Lovenox Code Status: DNR Family Communication: None at bedside Disposition Plan:  Status is: Inpatient Remains inpatient appropriate because: Need for IV fluids and meds.  Consultants:  Neurology ID  Procedures:  LP 8/17 EEG 8/17  Antimicrobials:  Anti-infectives (From admission, onward)    Start     Dose/Rate Route Frequency Ordered Stop   08/26/21 0930  cefTRIAXone (ROCEPHIN) 2 g in sodium chloride 0.9 % 100 mL IVPB        2 g 200 mL/hr over 30 Minutes Intravenous  Once 08/26/21 0926 08/26/21 1021   08/26/21 0930  vancomycin (VANCOREADY) IVPB 1500 mg/300 mL        1,500 mg 150 mL/hr over 120 Minutes Intravenous  Once 08/26/21 0926 08/26/21 1333   08/26/21 0930  ampicillin (OMNIPEN) 2 g in sodium chloride 0.9 % 100 mL IVPB        2 g 300 mL/hr over 20 Minutes Intravenous  Once 08/26/21 2836 08/26/21 1123       Subjective: Patient seen and evaluated today and cannot describe any new complaints.  No acute overnight events noted.  Objective: Vitals:   08/27/21 2205 08/28/21 0047 08/28/21 0620 08/28/21 0949  BP: (!) 150/67 (!) 153/75 (!) 171/77 (!) 164/84  Pulse: 69 65 83 69  Resp: 18 16 20  (!)  21  Temp: 98.2 F (36.8 C) 97.8 F (36.6 C) 98.8 F (37.1 C) (!) 97.5 F (36.4 C)  TempSrc: Oral   Oral  SpO2: 98% 98% 96% 100%  Weight:      Height:        Intake/Output Summary (Last 24 hours) at 08/28/2021 1106 Last data filed at 08/28/2021 0454 Gross per 24 hour  Intake 3023.87 ml  Output --  Net 3023.87  ml   Filed Weights   08/26/21 0753 08/26/21 1435  Weight: 66 kg 61.4 kg    Examination:  General exam: Appears calm and comfortable  Respiratory system: Clear to auscultation. Respiratory effort normal. Cardiovascular system: S1 & S2 heard, RRR.  Gastrointestinal system: Abdomen is soft Central nervous system: Alert and awake, does not respond well to questioning and stares off Extremities: No edema Skin: No significant lesions noted Psychiatry: Flat affect.    Data Reviewed: I have personally reviewed following labs and imaging studies  CBC: Recent Labs  Lab 08/26/21 0755 08/27/21 0421 08/28/21 0630  WBC 19.9* 11.5* 10.9*  NEUTROABS 17.5*  --   --   HGB 13.1 10.7* 11.5*  HCT 40.6 33.6* 35.2*  MCV 89.8 88.9 86.7  PLT 298 225 245   Basic Metabolic Panel: Recent Labs  Lab 08/26/21 0755 08/27/21 0421 08/28/21 0630  NA 150* 147* 140  K 3.7 3.3* 3.1*  CL 115* 116* 109  CO2 22 25 24   GLUCOSE 104* 140* 120*  BUN 44* 36* 20  CREATININE 1.23 0.89 0.66  CALCIUM 10.3 9.0 8.9  MG 2.3  --  1.7   GFR: Estimated Creatinine Clearance: 83.1 mL/min (by C-G formula based on SCr of 0.66 mg/dL). Liver Function Tests: Recent Labs  Lab 08/26/21 0755  AST 76*  ALT 33  ALKPHOS 107  BILITOT 1.2  PROT 7.8  ALBUMIN 4.6   No results for input(s): "LIPASE", "AMYLASE" in the last 168 hours. Recent Labs  Lab 08/26/21 0755  AMMONIA 37*   Coagulation Profile: Recent Labs  Lab 08/26/21 0755  INR 1.0   Cardiac Enzymes: Recent Labs  Lab 08/26/21 0755 08/27/21 0421 08/28/21 0653  CKTOTAL 4,006* 9,278* 9,254*   BNP (last 3 results) No results for input(s): "PROBNP" in the last 8760 hours. HbA1C: No results for input(s): "HGBA1C" in the last 72 hours. CBG: Recent Labs  Lab 08/26/21 1456  GLUCAP 110*   Lipid Profile: No results for input(s): "CHOL", "HDL", "LDLCALC", "TRIG", "CHOLHDL", "LDLDIRECT" in the last 72 hours. Thyroid Function Tests: Recent Labs     08/27/21 0421  TSH 4.207   Anemia Panel: No results for input(s): "VITAMINB12", "FOLATE", "FERRITIN", "TIBC", "IRON", "RETICCTPCT" in the last 72 hours. Sepsis Labs: Recent Labs  Lab 08/26/21 0755 08/26/21 0931 08/26/21 1036  PROCALCITON <0.10  --   --   LATICACIDVEN  --  1.7 1.0    Recent Results (from the past 240 hour(s))  Blood culture (routine x 2)     Status: None (Preliminary result)   Collection Time: 08/26/21  9:31 AM   Specimen: BLOOD  Result Value Ref Range Status   Specimen Description BLOOD LEFT FOREARM  Final   Special Requests   Final    BOTTLES DRAWN AEROBIC AND ANAEROBIC Blood Culture results may not be optimal due to an excessive volume of blood received in culture bottles   Culture   Final    NO GROWTH 2 DAYS Performed at [facility], [address], [place], [place]  Report Status PENDING  Incomplete  Blood culture (routine x 2)     Status: None (Preliminary result)   Collection Time: 08/26/21  9:31 AM   Specimen: BLOOD  Result Value Ref Range Status   Specimen Description BLOOD RIGHT AC  Final   Special Requests   Final    BOTTLES DRAWN AEROBIC AND ANAEROBIC Blood Culture results may not be optimal due to an excessive volume of blood received in culture bottles   Culture   Final    NO GROWTH 2 DAYS Performed at [facility], [address], [place], [place] [address]    Report Status PENDING  Incomplete  CSF culture w Gram Stain     Status: None (Preliminary result)   Collection Time: 08/26/21  9:31 AM   Specimen: CSF; Cerebrospinal Fluid  Result Value Ref Range Status   Specimen Description   Final    CSF Performed at [facility], [address], [place], [place] [address]    Special Requests   Final    NONE Performed at [facility], [address], [place], [place] [address]    Gram Stain   Final    NO ORGANISMS SEEN RED BLOOD CELLS WBC SEEN Performed at [facility], [address] [place], [place] [address]    Culture   Final    NO GROWTH 2 DAYS Performed at [facility], [address], [place], [place] [address]    Report Status PENDING  Incomplete  MRSA Next Gen by PCR, Nasal     Status: None   Collection Time: 08/26/21  3:29 PM   Specimen: Nasal Swab  Result Value Ref Range Status   MRSA by PCR Next Gen NOT DETECTED NOT DETECTED Final    Comment: (NOTE) The GeneXpert MRSA Assay (FDA approved for NASAL specimens only), is one component of a comprehensive MRSA colonization surveillance program. It is not intended to diagnose MRSA infection nor to guide or monitor treatment for MRSA infections. Test performance is not FDA approved in patients less than 73 years old. Performed at [facility], [address], [place], [place] [address]          Radiology Studies: MR BRAIN W WO CONTRAST  Result Date: 08/28/2021 CLINICAL DATA:  Stroke suspected, cognitive decline EXAM: MRI HEAD WITHOUT AND WITH CONTRAST TECHNIQUE: Multiplanar, multiecho pulse sequences of the brain and surrounding structures were obtained without and with intravenous contrast. CONTRAST:  27mL GADAVIST GADOBUTROL 1 MMOL/ML IV SOLN COMPARISON:  No prior MRI, correlation is made with 08/26/2021 CT head FINDINGS: Brain: No restricted diffusion to suggest acute or subacute infarct. No acute hemorrhage, mass, mass effect, or midline shift. No hydrocephalus or extra-axial collection. No hemosiderin deposition to suggest remote hemorrhage. No abnormal parenchymal or meningeal enhancement, although the postcontrast sequences are somewhat motion limited. Vascular: Normal arterial and venous enhancement. Skull and upper cervical spine: Normal marrow signal. Sinuses/Orbits: Mucous retention cyst in the left maxillary sinus. The orbits are unremarkable. Other: The mastoids are well aerated. IMPRESSION: No acute intracranial process. No evidence of acute or subacute infarct. The  postcontrast sequences are somewhat motion limited; within this limitation, no abnormal enhancement is seen. Electronically Signed   By: Wiliam Ke M.D.   On: 08/28/2021 04:10   EEG adult  Result Date: 08/27/2021 Jefferson Fuel, MD     08/27/2021  1:40 PM Routine EEG Report Coady Train. is a 61 y.o. male with a history  of encephalopathy who is undergoing an EEG to evaluate for seizures. Report: This EEG was acquired with electrodes placed according to the International 10-20 electrode system (including Fp1, Fp2, F3, F4, C3, C4, P3, P4, O1, O2, T3, T4, T5, T6, A1, A2, Fz, Cz, Pz). The following electrodes were missing or displaced: none. The occipital dominant rhythm was 6 Hz with overriding beta frequencies. This activity is reactive to stimulation. Drowsiness was manifested by background fragmentation; deeper stages of sleep were identified by K complexes and sleep spindles. There was no focal slowing. There were no interictal epileptiform discharges. There were no electrographic seizures identified. Photic stimulation and hyperventilation were not performed. Impression and clinical correlation: This EEG was obtained while awake and asleep and is abnormal due to moderate diffuse slowing indicative of global cerebral dysfunction. Epileptiform abnormalities were not seen during this recording. Bing Neighbors, MD [facility] [phone] If 7pm- 7am, please page neurology on call as listed in AMION.        Scheduled Meds:  Chlorhexidine Gluconate Cloth  6 each Topical Daily   enoxaparin (LOVENOX) injection  40 mg Subcutaneous Q24H   nicotine  21 mg Transdermal Daily   mouth rinse  15 mL Mouth Rinse 4 times per day   Continuous Infusions:  dextrose 5% lactated ringers     potassium chloride       LOS: 2 days    Time spent: 35 minutes    Mattia Liford Hoover Brunette, DO [facility]  If 7PM-7AM, please contact night-coverage [email or web address] 08/28/2021, 11:06 AM

## 2021-08-28 NOTE — Consult Note (Signed)
[facility] Face-to-Face Psychiatry Consult   Reason for Consult: Consult for this 61 year old man with a history of schizoaffective disorder who presents to the hospital with falls and altered mental status with concerns raised about possible serotonin syndrome Referring Physician:  Agbata Patient Identification: Christopher Valdez. MRN:  [id number] Principal Diagnosis: Serotonin syndrome Diagnosis:  Active Problems:   Other schizoaffective disorders (HCC)   Tobacco use   COPD (chronic obstructive pulmonary disease) (HCC)   Protein-calorie malnutrition, severe (HCC)   Hypernatremia   Rhabdomyolysis   Acute metabolic encephalopathy   Total Time spent with patient: 20 minutes  Subjective:   Christopher Valdez. is a 61 y.o. male patient admitted with Serotonin Syndrome.  Per chart, patient with history of schizoaffective disorder who currently resides in a group home.  On exam today patient is awake but non verbal and unable to appropriately answer questions despite multiple attempts.  No family or other staff at bedside to gain information from.  Tremors noted in upper extremities, nothing noticed in the lower extremities.    HPI per Dr. Toni Amend: Patient seen and chart reviewed.  Case reviewed with hospitalist.  61 year old man unfamiliar to me with a history of schizoaffective disorder brought from his group home with a report of several days of change in mental status with unsteadiness on his feet and multiple falls.  Other available history is a report that he had been off of his clozapine for some time recently.  Not sure if he had been completely off it or it might have restarted it.  Unclear whether he has still been on all of his other medicine.  So far labs show him to be hypernatremic with signs of dehydration, mild to moderately elevated blood pressure other vitals currently read as normal.  Elevated CK at 4000.  Patient appeared to be awake when I came into the room.  Eyes were open and he did  make eye contact.  I ask him multiple questions to see if he could speak.  He ultimately was able to reply affirmatively that he was able to hear me but that was the only thing that he said, could not even state his name.  Could not shake his head or nod his head to answer questions.  Patient appears to be tremulous all over.  He was able to move his hands very slowly but with limited purposeful movement.  I examined his arm and found him to be very stiff at the elbow and shoulder.  It was possible to move the joints but there was a lot of resistance possibly some cogwheeling.  Examination of the tremor in his lower extremity suggest that there is some clonic worsening with movement in the ankle.  Past Psychiatric History: Patient has a history of schizoaffective disorder it appears that he has been maintained on clozapine and Depakote as most notable agents.  Current med list also includes Ingrazza and Prozac I am not sure how long those have been prescribed but it has been at least a couple years.  Nothing I can see in the notes to suggest that any medicines have been started or stopped other than the above mentioned history of recently not being on clozapine.  I do not see anything about past history of seizures.  There is no noted history of previous psychiatric hospitalizations.  Reports from his family practice doctor suggest that even as recently as earlier this year the patient was working a job 1 day a week and  able to drive by himself.  Risk to Self:  none Risk to Others:  none Prior Inpatient Therapy:  yes Prior Outpatient Therapy:  yes  Past Medical History:  Past Medical History:  Diagnosis Date   Calculus of kidney    Chronic constipation    Dyslipidemia    GERD (gastroesophageal reflux disease)    Proteinuria    Schizo-affective schizophrenia (HCC)    Vitamin D deficiency     Past Surgical History:  Procedure Laterality Date   COLONOSCOPY  07/02/2012   CYSTOSCOPY W/ RETROGRADES  Right 05/10/2019   Procedure: CYSTOSCOPY WITH RETROGRADE PYELOGRAM;  Surgeon: Sondra Come, MD;  Location: [facility] ORS;  Service: Urology;  Laterality: Right;   CYSTOSCOPY/URETEROSCOPY/HOLMIUM LASER/STENT PLACEMENT Right 05/10/2019   Procedure: CYSTOSCOPY/URETEROSCOPY/HOLMIUM LASER/STENT PLACEMENT;  Surgeon: Sondra Come, MD;  Location: [facility] ORS;  Service: Urology;  Laterality: Right;   Family History:  Family History  Problem Relation Age of Onset   Parkinson's disease Mother    Deep vein thrombosis Father    Osteoarthritis Father    Family Psychiatric  History: No information Social History:  Social History   Substance and Sexual Activity  Alcohol Use No   Alcohol/week: 0.0 standard drinks of alcohol     Social History   Substance and Sexual Activity  Drug Use No    Social History   Socioeconomic History   Marital status: Single    Spouse name: Not on file   Number of children: 0   Years of education: Not on file   Highest education level: High school graduate  Occupational History   Occupation: [employer]  Tobacco Use   Smoking status: Every Day    Packs/day: 1.00    Years: 40.00    Total pack years: 40.00    Types: Cigarettes   Smokeless tobacco: Never  Vaping Use   Vaping Use: Never used  Substance and Sexual Activity   Alcohol use: No    Alcohol/week: 0.0 standard drinks of alcohol   Drug use: No   Sexual activity: Never  Other Topics Concern   Not on file  Social History Narrative   He lives in a group home and works one day per week at [employer]   Financial Resource Strain: Low Risk  (04/13/2021)   Overall Financial Resource Strain (CARDIA)    Difficulty of Paying Living Expenses: Not very hard  Food Insecurity: No Food Insecurity (04/13/2021)   Hunger Vital Sign    Worried About Running Out of Food in the Last Year: Never true    Ran Out of Food in the Last Year: Never true  Transportation Needs: No Transportation Needs  (04/13/2021)   PRAPARE - Administrator, Civil Service (Medical): No    Lack of Transportation (Non-Medical): No  Physical Activity: Inactive (04/13/2021)   Exercise Vital Sign    Days of Exercise per Week: 0 days    Minutes of Exercise per Session: 0 min  Stress: No Stress Concern Present (04/13/2021)   [employer] of Occupational Health - Occupational Stress Questionnaire    Feeling of Stress : Not at all  Social Connections: Socially Isolated (04/13/2021)   Social Connection and Isolation Panel [NHANES]    Frequency of Communication with Friends and Family: More than three times a week    Frequency of Social Gatherings with Friends and Family: Twice a week    Attends Religious Services: Never    Active Member  of Clubs or Organizations: No    Attends Banker Meetings: Never    Marital Status: Never married   Additional Social History:    Allergies:   Allergies  Allergen Reactions   Tall Ragweed Other (See Comments)    Pollen    Labs:  Results for orders placed or performed during the hospital encounter of 08/26/21 (from the past 48 hour(s))  Glucose, capillary     Status: Abnormal   Collection Time: 08/26/21  2:56 PM  Result Value Ref Range   Glucose-Capillary 110 (H) 70 - 99 mg/dL    Comment: Glucose reference range applies only to samples taken after fasting for at least 8 hours.  Urine Drug Screen, Qualitative ([facility] only)     Status: Abnormal   Collection Time: 08/26/21  3:29 PM  Result Value Ref Range   Tricyclic, Ur Screen NONE DETECTED NONE DETECTED   Amphetamines, Ur Screen NONE DETECTED NONE DETECTED   MDMA (Ecstasy)Ur Screen NONE DETECTED NONE DETECTED   Cocaine Metabolite,Ur [place] NONE DETECTED NONE DETECTED   Opiate, Ur Screen NONE DETECTED NONE DETECTED   Phencyclidine (PCP) Ur S NONE DETECTED NONE DETECTED   Cannabinoid 50 Ng, Ur [place] NONE DETECTED NONE DETECTED   Barbiturates, Ur Screen NONE DETECTED NONE DETECTED   Benzodiazepine, Ur  Scrn POSITIVE (A) NONE DETECTED   Methadone Scn, Ur NONE DETECTED NONE DETECTED    Comment: (NOTE) Tricyclics + metabolites, urine    Cutoff 1000 ng/mL Amphetamines + metabolites, urine  Cutoff 1000 ng/mL MDMA (Ecstasy), urine              Cutoff 500 ng/mL Cocaine Metabolite, urine          Cutoff 300 ng/mL Opiate + metabolites, urine        Cutoff 300 ng/mL Phencyclidine (PCP), urine         Cutoff 25 ng/mL Cannabinoid, urine                 Cutoff 50 ng/mL Barbiturates + metabolites, urine  Cutoff 200 ng/mL Benzodiazepine, urine              Cutoff 200 ng/mL Methadone, urine                   Cutoff 300 ng/mL  The urine drug screen provides only a preliminary, unconfirmed analytical test result and should not be used for non-medical purposes. Clinical consideration and professional judgment should be applied to any positive drug screen result due to possible interfering substances. A more specific alternate chemical method must be used in order to obtain a confirmed analytical result. Gas chromatography / mass spectrometry (GC/MS) is the preferred confirm atory method. Performed at [facility], [address] [place], [place] [address]   Urinalysis, Complete w Microscopic     Status: Abnormal   Collection Time: 08/26/21  3:29 PM  Result Value Ref Range   Color, Urine YELLOW (A) YELLOW   APPearance HAZY (A) CLEAR   Specific Gravity, Urine 1.025 1.005 - 1.030   pH 5.0 5.0 - 8.0   Glucose, UA NEGATIVE NEGATIVE mg/dL   Hgb urine dipstick LARGE (A) NEGATIVE   Bilirubin Urine NEGATIVE NEGATIVE   Ketones, ur 20 (A) NEGATIVE mg/dL   Protein, ur 914 (A) NEGATIVE mg/dL   Nitrite NEGATIVE NEGATIVE   Leukocytes,Ua NEGATIVE NEGATIVE   RBC / HPF 0-5 0 - 5 RBC/hpf   WBC, UA 0-5 0 - 5 WBC/hpf   Bacteria, UA RARE (  A) NONE SEEN   Squamous Epithelial / LPF 0-5 0 - 5   Mucus PRESENT    Sperm, UA PRESENT     Comment: Performed at [facility], [address]  [place], [place] [address]  MRSA Next Gen by PCR, Nasal     Status: None   Collection Time: 08/26/21  3:29 PM   Specimen: Nasal Swab  Result Value Ref Range   MRSA by PCR Next Gen NOT DETECTED NOT DETECTED    Comment: (NOTE) The GeneXpert MRSA Assay (FDA approved for NASAL specimens only), is one component of a comprehensive MRSA colonization surveillance program. It is not intended to diagnose MRSA infection nor to guide or monitor treatment for MRSA infections. Test performance is not FDA approved in patients less than 65 years old. Performed at [facility], [address] [place], [place] [address]   Basic metabolic panel     Status: Abnormal   Collection Time: 08/27/21  4:21 AM  Result Value Ref Range   Sodium 147 (H) 135 - 145 mmol/L   Potassium 3.3 (L) 3.5 - 5.1 mmol/L   Chloride 116 (H) 98 - 111 mmol/L   CO2 25 22 - 32 mmol/L   Glucose, Bld 140 (H) 70 - 99 mg/dL    Comment: Glucose reference range applies only to samples taken after fasting for at least 8 hours.   BUN 36 (H) 8 - 23 mg/dL   Creatinine, Ser 6.50 0.61 - 1.24 mg/dL   Calcium 9.0 8.9 - 35.4 mg/dL   GFR, Estimated >65 >68 mL/min    Comment: (NOTE) Calculated using the CKD-EPI Creatinine Equation (2021)    Anion gap 6 5 - 15    Comment: Performed at [facility], [address] [place], [place] [address]  CBC     Status: Abnormal   Collection Time: 08/27/21  4:21 AM  Result Value Ref Range   WBC 11.5 (H) 4.0 - 10.5 K/uL   RBC 3.78 (L) 4.22 - 5.81 MIL/uL   Hemoglobin 10.7 (L) 13.0 - 17.0 g/dL   HCT 70.0 (L) 17.4 - 94.4 %   MCV 88.9 80.0 - 100.0 fL   MCH 28.3 26.0 - 34.0 pg   MCHC 31.8 30.0 - 36.0 g/dL   RDW 96.7 (H) 59.1 - 63.8 %   Platelets 225 150 - 400 K/uL   nRBC 0.0 0.0 - 0.2 %    Comment: Performed at [facility], [address] [place], [place] [address]  CK     Status: Abnormal   Collection Time: 08/27/21  4:21 AM  Result Value Ref Range   Total CK 9,278 (H) 49 -  397 U/L    Comment: RESULT CONFIRMED BY MANUAL DILUTION MJU Performed at [facility], [address] [place], [place] [address]   TSH     Status: None   Collection Time: 08/27/21  4:21 AM  Result Value Ref Range   TSH 4.207 0.350 - 4.500 uIU/mL    Comment: Performed by a 3rd Generation assay with a functional sensitivity of <=0.01 uIU/mL. Performed at [facility], [address] [place], [place] [address]   Basic metabolic panel     Status: Abnormal   Collection Time: 08/28/21  6:30 AM  Result Value Ref Range   Sodium 140 135 - 145 mmol/L   Potassium 3.1 (L) 3.5 - 5.1 mmol/L   Chloride 109 98 - 111 mmol/L   CO2 24 22 - 32 mmol/L  Glucose, Bld 120 (H) 70 - 99 mg/dL    Comment: Glucose reference range applies only to samples taken after fasting for at least 8 hours.   BUN 20 8 - 23 mg/dL   Creatinine, Ser 7.90 0.61 - 1.24 mg/dL   Calcium 8.9 8.9 - 24.0 mg/dL   GFR, Estimated >97 >35 mL/min    Comment: (NOTE) Calculated using the CKD-EPI Creatinine Equation (2021)    Anion gap 7 5 - 15    Comment: Performed at [facility], [address] [place], [place] [address]  Magnesium     Status: None   Collection Time: 08/28/21  6:30 AM  Result Value Ref Range   Magnesium 1.7 1.7 - 2.4 mg/dL    Comment: Performed at [facility], [address] [place], [place] [address]  CBC     Status: Abnormal   Collection Time: 08/28/21  6:30 AM  Result Value Ref Range   WBC 10.9 (H) 4.0 - 10.5 K/uL   RBC 4.06 (L) 4.22 - 5.81 MIL/uL   Hemoglobin 11.5 (L) 13.0 - 17.0 g/dL   HCT 41.9 (L) 62.2 - 29.7 %   MCV 86.7 80.0 - 100.0 fL   MCH 28.3 26.0 - 34.0 pg   MCHC 32.7 30.0 - 36.0 g/dL   RDW 98.9 21.1 - 94.1 %   Platelets 245 150 - 400 K/uL   nRBC 0.0 0.0 - 0.2 %    Comment: Performed at [facility], [address] [place], [place] [address]  CK     Status: Abnormal   Collection Time: 08/28/21  6:53 AM  Result Value Ref Range   Total CK  9,254 (H) 49 - 397 U/L    Comment: RESULT CONFIRMED BY MANUAL DILUTION.PMF Performed at [facility], [address] [place], [place] [address]     Current Facility-Administered Medications  Medication Dose Route Frequency Provider Last Rate Last Admin   Chlorhexidine Gluconate Cloth 2 % PADS 6 each  6 each Topical Daily Agbata, Tochukwu, MD   6 each at 08/28/21 0950   dextrose 5 % in lactated ringers infusion   Intravenous Continuous Sherryll Burger, Pratik D, DO 150 mL/hr at 08/28/21 1139 New Bag at 08/28/21 1139   enoxaparin (LOVENOX) injection 40 mg  40 mg Subcutaneous Q24H Agbata, Tochukwu, MD   40 mg at 08/27/21 2209   ipratropium-albuterol (DUONEB) 0.5-2.5 (3) MG/3ML nebulizer solution 3 mL  3 mL Nebulization Q6H PRN Agbata, Tochukwu, MD       LORazepam (ATIVAN) injection 2 mg  2 mg Intravenous Q2H PRN Agbata, Tochukwu, MD   2 mg at 08/26/21 1222   nicotine (NICODERM CQ - dosed in mg/24 hours) patch 21 mg  21 mg Transdermal Daily Sherryll Burger, Vipul, MD   21 mg at 08/28/21 0955   ondansetron (ZOFRAN) tablet 4 mg  4 mg Oral Q6H PRN Agbata, Tochukwu, MD       Or   ondansetron (ZOFRAN) injection 4 mg  4 mg Intravenous Q6H PRN Agbata, Tochukwu, MD       Oral care mouth rinse  15 mL Mouth Rinse 4 times per day Delfino Lovett, MD   15 mL at 08/28/21 1138   Oral care mouth rinse  15 mL Mouth Rinse PRN Delfino Lovett, MD       potassium chloride 10 mEq in 100 mL IVPB  10 mEq Intravenous Q1 Hr x 4 Shah, Pratik D, DO 100 mL/hr at 08/28/21 1140 10 mEq at 08/28/21 1140  Musculoskeletal: Strength & Muscle Tone: spastic and abnormal Gait & Station: unable to stand Patient leans: N/A  Psychiatric Specialty Exam: Physical Exam Vitals and nursing note reviewed.  Constitutional:      Appearance: He is ill-appearing.  HENT:     Head: Normocephalic and atraumatic.     Mouth/Throat:     Pharynx: Oropharynx is clear.  Eyes:     Pupils: Pupils are equal, round, and reactive to light.  Cardiovascular:      Rate and Rhythm: Normal rate and regular rhythm.  Pulmonary:     Effort: Pulmonary effort is normal.     Breath sounds: Normal breath sounds.  Abdominal:     General: Abdomen is flat.     Palpations: Abdomen is soft.  Musculoskeletal:        General: Normal range of motion.  Skin:    General: Skin is warm and dry.  Neurological:     General: No focal deficit present.     Mental Status: He is alert.     Motor: Tremor present.     Comments: Patient is tremulous all over with very notable stiffness to movement throughout.  The tremor is at least partially clonic and worsened by attempted movement.  Psychiatric:        Mood and Affect: Affect is blunt.        Speech: He is noncommunicative.     Review of Systems  Unable to perform ROS: Medical condition    Blood pressure (!) 164/84, pulse 69, temperature (!) 97.5 F (36.4 C), temperature source Oral, resp. rate (!) 21, height 6' (1.829 m), weight 61.4 kg, SpO2 100 %.Body mass index is 18.36 kg/m.  General Appearance: Casual  Eye Contact:  Good  Speech:  Negative  Volume:   none  Mood:  UTA, nonverbal  Affect:  Blunt  Thought Process:  UTA  Orientation:  UTA  Thought Content:  UTA  Suicidal Thoughts:  UTA  Homicidal Thoughts:  UTA  Memory:  UTA  Judgement:  impaired  Insight:  UTA  Psychomotor Activity:  Decreased  Concentration:  UTA  Recall:  UTA  Fund of Knowledge:  UTA  Language:  Negative  Akathisia:  UTA  Handed:  UTA  AIMS (if indicated):     Assets:  Leisure Time Resilience Social Support  ADL's:  Impaired  Cognition:  Impaired,  Severe  Sleep:        Physical Exam: Physical Exam Vitals and nursing note reviewed.  Constitutional:      Appearance: He is ill-appearing.  HENT:     Head: Normocephalic and atraumatic.     Mouth/Throat:     Pharynx: Oropharynx is clear.  Eyes:     Pupils: Pupils are equal, round, and reactive to light.  Cardiovascular:     Rate and Rhythm: Normal rate and regular  rhythm.  Pulmonary:     Effort: Pulmonary effort is normal.     Breath sounds: Normal breath sounds.  Abdominal:     General: Abdomen is flat.     Palpations: Abdomen is soft.  Musculoskeletal:        General: Normal range of motion.  Skin:    General: Skin is warm and dry.  Neurological:     General: No focal deficit present.     Mental Status: He is alert.     Motor: Tremor present.     Comments: Patient is tremulous all over with very notable stiffness to movement throughout.  The tremor  is at least partially clonic and worsened by attempted movement.  Psychiatric:        Mood and Affect: Affect is blunt.        Speech: He is noncommunicative.    Review of Systems  Unable to perform ROS: Medical condition   Blood pressure (!) 164/84, pulse 69, temperature (!) 97.5 F (36.4 C), temperature source Oral, resp. rate (!) 21, height 6' (1.829 m), weight 61.4 kg, SpO2 100 %. Body mass index is 18.36 kg/m.  Treatment Plan Summary: Per Dr Toni Amend:  Medication management and Plan patient with recent mental status change.  Workup so far is being done but has not yet yielded a specific etiology.  Clinically he is appearing to be confused with altered mental state and tremulousness all over.  All of this would be consistent with serotonin syndrome, however his vital signs are not extremely abnormal.  Additionally it is unclear why he would have developed serotonin syndrome if there had not been any change in his medicines and less possibly some metabolic change had affected his blood levels.  Cannot completely rule out neuroleptic malignant syndrome although none of the medicines that he is on are typically thought to be high risk for causing NMS.  I agree with the plan to admit him to the intensive care unit.  CT scan normal.  Lumbar puncture done and results pending.  The only test that immediately occurs to me in addition to following up on his CK is that it might be worthwhile to check an EEG.   It is at least conceivable that this could be a seizure phenomenon and could be related to recent decreases in Depakote or some other etiology.  Otherwise treatment with benzodiazepines and Periactin and supportive therapy seems appropriate as does discontinuing all unnecessary medicines including fluoxetine probably for now the Clozapine as well.  We will follow as needed.  Recommend rule out neurosyphillis  Disposition:  Follow-up medical admission for now  Nanine Means, NP 08/28/2021 11:51 AM

## 2021-08-28 NOTE — Evaluation (Signed)
Physical Therapy Evaluation Patient Details Name: Christopher Valdez. MRN: [id number] DOB: 1960-04-16 Today's Date: 08/28/2021  History of Present Illness  Pt is a 61 y/o M admitted on 08/26/21 after presenting with c/o generalized weakness, AMS, & falls. Pt is being treated for acute metabolic encephalopathy, likely multifactoral. PMH: schizoaffective disorder, GERD, nephrolithiasis  Clinical Impression  Pt seen for PT evaluation with NT in room assisting pt eat applesauce & consume water. Pt responds to name & is oriented to age but unable to provide any other information. (All PLOF, home set up information obtained via telephone from sister [name].) PT requires max assist for supine>sit but initiates sit>supine of his own volition. During session pt develops BUE tremors that then become BUE/BLE tremors, that do decrease somewhat when he returns supine (MD & nurse notified).  Pt follows simple commands throughout session (ex: "lift your head" to allow PT to place pillow). Per sister, pt is very different from his baseline of ambulatory without AD. At this time, will recommend STR upon d/c to maximize independence with functional mobility, decrease caregiver burden, & reduce fall risk prior to return home.    Recommendations for follow up therapy are one component of a multi-disciplinary discharge planning process, led by the attending physician.  Recommendations may be updated based on patient status, additional functional criteria and insurance authorization.  Follow Up Recommendations Skilled nursing-short term rehab (<3 hours/day) Can patient physically be transported by private vehicle: No    Assistance Recommended at Discharge Frequent or constant Supervision/Assistance  Patient can return home with the following  Two people to help with walking and/or transfers;Two people to help with bathing/dressing/bathroom;Help with stairs or ramp for entrance;Direct supervision/assist for medications  management;Assist for transportation;Assistance with feeding;Assistance with cooking/housework;Direct supervision/assist for financial management    Equipment Recommendations None recommended by PT  Recommendations for Other Services  OT consult    Functional Status Assessment Patient has had a recent decline in their functional status and demonstrates the ability to make significant improvements in function in a reasonable and predictable amount of time.     Precautions / Restrictions Precautions Precautions: Fall Restrictions Weight Bearing Restrictions: No      Mobility  Bed Mobility Overal bed mobility: Needs Assistance Bed Mobility: Supine to Sit, Sit to Supine     Supine to sit: Max assist, HOB elevated (cuing/assistance to initiate but pt able to participate in uprighting trunk on EOB) Sit to supine: Supervision, HOB elevated (Pt initiates sit>supine of his own volition without assistance.)   General bed mobility comments: max<>total assist to scoot to [facility] with bed in trendelenburg position    Transfers Overall transfer level:  (deferred 2/2 unsafe to attempt)                      Ambulation/Gait                  Stairs            Wheelchair Mobility    Modified Rankin (Stroke Patients Only)       Balance Overall balance assessment: Needs assistance Sitting-balance support: Feet supported, Bilateral upper extremity supported Sitting balance-Leahy Scale: Poor                                       Pertinent Vitals/Pain Pain Assessment Pain Assessment: Faces Faces Pain Scale: No hurt  Home Living Family/patient expects to be discharged to:: Group home                   Additional Comments: 1 level with threshold step to enter    Prior Function               Mobility Comments: Per sister Manuela Schwartz) pt was ambulatory without AD but has experienced a major decline recently where he's had multiple falls,  more shuffled gait, worsening tremors & worsening cognition. ADLs Comments: Per sister Manuela Schwartz) pt was able to feed, bathe, & dress himself at group home.     Hand Dominance        Extremity/Trunk Assessment   Upper Extremity Assessment Upper Extremity Assessment: Difficult to assess due to impaired cognition;Generalized weakness    Lower Extremity Assessment Lower Extremity Assessment: Generalized weakness;Difficult to assess due to impaired cognition    Cervical / Trunk Assessment Cervical / Trunk Assessment:  (PT observed total body tremors during session, initiated in BUE but eventually in all four extremities.)  Communication   Communication:  (pt with minimal verbal communication during session, but clear when he does)  Cognition Arousal/Alertness: Awake/alert Behavior During Therapy: Flat affect Overall Cognitive Status: Impaired/Different from baseline Area of Impairment: Orientation, Attention, Memory, Following commands, Awareness, Safety/judgement, Problem solving                 Orientation Level: Disoriented to, Place, Time, Situation   Memory: Decreased short-term memory Following Commands: Follows one step commands inconsistently, Follows one step commands with increased time Safety/Judgement: Decreased awareness of deficits, Decreased awareness of safety Awareness: Intellectual Problem Solving: Slow processing, Decreased initiation, Requires verbal cues, Requires tactile cues General Comments: Pt repsonds to name & is oriented to age. Follows simple commands with extra time <25% of the time.        General Comments      Exercises     Assessment/Plan    PT Assessment Patient needs continued PT services  PT Problem List Decreased strength;Decreased coordination;Decreased activity tolerance;Decreased balance;Decreased mobility;Decreased safety awareness;Decreased knowledge of use of DME;Decreased cognition       PT Treatment Interventions DME  instruction;Gait training;Balance training;Therapeutic exercise;Neuromuscular re-education;Functional mobility training;Therapeutic activities;Patient/family education;Cognitive remediation    PT Goals (Current goals can be found in the Care Plan section)  Acute Rehab PT Goals Patient Stated Goal: figure out what's going on, get better PT Goal Formulation: With family Time For Goal Achievement: 09/11/21 Potential to Achieve Goals: Fair    Frequency Min 2X/week     Co-evaluation               AM-PAC PT "6 Clicks" Mobility  Outcome Measure Help needed turning from your back to your side while in a flat bed without using bedrails?: A Little Help needed moving from lying on your back to sitting on the side of a flat bed without using bedrails?: A Lot Help needed moving to and from a bed to a chair (including a wheelchair)?: Total Help needed standing up from a chair using your arms (e.g., wheelchair or bedside chair)?: Total Help needed to walk in hospital room?: Total Help needed climbing 3-5 steps with a railing? : Total 6 Click Score: 9    End of Session   Activity Tolerance: Patient tolerated treatment well Patient left: in bed;with call bell/phone within reach;with bed alarm set Nurse Communication: Mobility status (MD & nurse notified of total body tremors) PT Visit Diagnosis: Other abnormalities of gait and mobility (  R26.89);Muscle weakness (generalized) (M62.81);Difficulty in walking, not elsewhere classified (R26.2)    Time: 0998-3382 PT Time Calculation (min) (ACUTE ONLY): 12 min   Charges:   PT Evaluation $PT Eval High Complexity: 1 High          Aleda Grana, PT, DPT 08/28/21, 9:04 AM   Sandi Mariscal 08/28/2021, 9:02 AM

## 2021-08-29 DIAGNOSIS — Z720 Tobacco use: Secondary | ICD-10-CM

## 2021-08-29 DIAGNOSIS — G9341 Metabolic encephalopathy: Secondary | ICD-10-CM | POA: Diagnosis not present

## 2021-08-29 LAB — CBC
HCT: 35.4 % — ABNORMAL LOW (ref 39.0–52.0)
Hemoglobin: 11.7 g/dL — ABNORMAL LOW (ref 13.0–17.0)
MCH: 29.1 pg (ref 26.0–34.0)
MCHC: 33.1 g/dL (ref 30.0–36.0)
MCV: 88.1 fL (ref 80.0–100.0)
Platelets: 211 10*3/uL (ref 150–400)
RBC: 4.02 MIL/uL — ABNORMAL LOW (ref 4.22–5.81)
RDW: 15.1 % (ref 11.5–15.5)
WBC: 9.5 10*3/uL (ref 4.0–10.5)
nRBC: 0 % (ref 0.0–0.2)

## 2021-08-29 LAB — BASIC METABOLIC PANEL
Anion gap: 5 (ref 5–15)
BUN: 9 mg/dL (ref 8–23)
CO2: 26 mmol/L (ref 22–32)
Calcium: 8.9 mg/dL (ref 8.9–10.3)
Chloride: 113 mmol/L — ABNORMAL HIGH (ref 98–111)
Creatinine, Ser: 0.63 mg/dL (ref 0.61–1.24)
GFR, Estimated: >60 mL/min (ref >60)
Glucose, Bld: 117 mg/dL — ABNORMAL HIGH (ref 70–99)
Potassium: 2.9 mmol/L — ABNORMAL LOW (ref 3.5–5.1)
Sodium: 144 mmol/L (ref 135–145)

## 2021-08-29 LAB — CSF CULTURE W GRAM STAIN
Culture: NO GROWTH
Gram Stain: NONE SEEN

## 2021-08-29 LAB — MAGNESIUM: Magnesium: 1.7 mg/dL (ref 1.7–2.4)

## 2021-08-29 LAB — CK: Total CK: 4614 U/L — ABNORMAL HIGH (ref 49–397)

## 2021-08-29 LAB — RPR: RPR Ser Ql: NONREACTIVE

## 2021-08-29 MED ORDER — HYDRALAZINE HCL 20 MG/ML IJ SOLN
10.0000 mg | Freq: Four times a day (QID) | INTRAMUSCULAR | Status: DC | PRN
Start: 2021-08-29 — End: 2021-09-02
  Administered 2021-08-29: 10 mg via INTRAVENOUS
  Filled 2021-08-29: qty 1

## 2021-08-29 MED ORDER — POTASSIUM CHLORIDE CRYS ER 20 MEQ PO TBCR
40.0000 meq | EXTENDED_RELEASE_TABLET | ORAL | Status: AC
  Administered 2021-08-29 (×3): 40 meq via ORAL
  Filled 2021-08-29 (×3): qty 2

## 2021-08-29 MED ORDER — MAGNESIUM SULFATE 2 GM/50ML IV SOLN
2.0000 g | Freq: Once | INTRAVENOUS | Status: AC
  Administered 2021-08-29: 2 g via INTRAVENOUS
  Filled 2021-08-29: qty 50

## 2021-08-29 MED ORDER — LOSARTAN POTASSIUM 25 MG PO TABS
25.0000 mg | ORAL_TABLET | Freq: Every day | ORAL | Status: DC
  Administered 2021-08-29: 25 mg via ORAL
  Filled 2021-08-29: qty 1

## 2021-08-29 NOTE — Assessment & Plan Note (Signed)
Resolved with IV hydration. -Continue to monitor

## 2021-08-29 NOTE — Assessment & Plan Note (Signed)
Blood pressure elevated. -Start him on low-dose losartan and monitor

## 2021-08-29 NOTE — Assessment & Plan Note (Signed)
Potassium at 2.9 with magnesium of 1.7. -Replete electrolytes and monitor

## 2021-08-29 NOTE — Progress Notes (Signed)
[employer] Bps running high. Hydralazine 10 mg as needed for sbp above 180

## 2021-08-29 NOTE — Assessment & Plan Note (Signed)
Continue aggressive IV fluid hydration.  He has had multiple falls which could be the etiology Hold statins CK levels started trending down. -Continue with IV hydration and monitor

## 2021-08-29 NOTE — Assessment & Plan Note (Signed)
Likely multifactorial.  Discussed with ID, neurology and reviewed psychiatry input EEG did not show any seizure activity Unlikely neuroleptic malignant or serotonin syndrome CSF positive for HHV-6 but per discussion with ID likely not significant MRI brain was negative for any acute abnormality. Cannot rule out paraneoplastic syndrome and or autoimmune encephalitis considering significant weight loss of 30-40 pounds since January -Follow-up RPR paraneoplastic labs.

## 2021-08-29 NOTE — Assessment & Plan Note (Signed)
Hold all antipsychotic agents for now.  Seen by psychiatry.  Currently on as needed Ativan

## 2021-08-29 NOTE — Assessment & Plan Note (Signed)
Counseled.  Nicotine patch ordered 

## 2021-08-29 NOTE — Assessment & Plan Note (Signed)
Estimated body mass index is 18.36 kg/m as calculated from the following:   Height as of this encounter: 6' (1.829 m).   Weight as of this encounter: 61.4 kg.  Complicates overall prognosis.  Per his sister he has had about 30 to 40 pound weight loss since January of this year.  She reports significant clinical and functional decline over last six 1 to 1-year.  -Dietitian consult

## 2021-08-29 NOTE — Assessment & Plan Note (Signed)
Not acutely exacerbated  as needed bronchodilator therapy.

## 2021-08-29 NOTE — Progress Notes (Signed)
Progress Note   Patient: Christopher Valdez. VWU:[id number] DOB: July 12, 1960 DOA: 08/26/2021     3 DOS: the patient was seen and examined on 08/29/2021   Brief hospital course: 61 y.o. male with medical history significant for schizoaffective disorder, GERD, nephrolithiasis who was sent over from group home for evaluation of generalized weakness, altered mental status and multiple falls.  8/17: Psychiatry, ID consult, stepdown status 8/18: Neurology consult, EEG showed no seizure, moving out to floors 8/19: Patient continues to remain altered and further work-up per neurology pending.  CK levels continue to remain elevated and patient remains on IV fluid.  8/20: MRI brain was negative for any acute abnormality.  CK level started trending down, at 4614 today.  Paraneoplastic and autoimmune labs are still pending.  RPR pending. Losartan 25 mg was added for persistently elevated blood pressure.   Assessment and Plan: * Acute metabolic encephalopathy Likely multifactorial.  Discussed with ID, neurology and reviewed psychiatry input EEG did not show any seizure activity Unlikely neuroleptic malignant or serotonin syndrome CSF positive for HHV-6 but per discussion with ID likely not significant MRI brain was negative for any acute abnormality. Cannot rule out paraneoplastic syndrome and or autoimmune encephalitis considering significant weight loss of 30-40 pounds since January -Follow-up RPR paraneoplastic labs.    Rhabdomyolysis Continue aggressive IV fluid hydration.  He has had multiple falls which could be the etiology Hold statins CK levels started trending down. -Continue with IV hydration and monitor  Essential hypertension Blood pressure elevated. -Start him on low-dose losartan and monitor  Protein-calorie malnutrition, severe (HCC) Estimated body mass index is 18.36 kg/m as calculated from the following:   Height as of this encounter: 6' (1.829 m).   Weight as of this  encounter: 61.4 kg.  Complicates overall prognosis.  Per his sister he has had about 30 to 40 pound weight loss since January of this year.  She reports significant clinical and functional decline over last six 1 to 1-year.  -Dietitian consult  Other schizoaffective disorders (HCC) Hold all antipsychotic agents for now.  Seen by psychiatry.  Currently on as needed Ativan  Hypernatremia Resolved with IV hydration. -Continue to monitor  Tobacco use Counseled.  Nicotine patch ordered  Hypokalemia Potassium at 2.9 with magnesium of 1.7. -Replete electrolytes and monitor  COPD (chronic obstructive pulmonary disease) (HCC) Not acutely exacerbated  as needed bronchodilator therapy.   Subjective: Patient was seen and examined today.  Unable to participate with any meaningful conversation, able to answer some orientation questions and appears to be oriented to self and place.  Physical Exam: Vitals:   08/29/21 0036 08/29/21 0131 08/29/21 0412 08/29/21 0911  BP: (!) 188/74 (!) 158/69 (!) 164/64 (!) 159/75  Pulse: 61 (!) 59 (!) 56 61  Resp: 18  20 16   Temp: 97.7 F (36.5 C)  98.3 F (36.8 C) 98.1 F (36.7 C)  TempSrc:    Oral  SpO2: 98% 97% 98% 98%  Weight:      Height:       General.  Malnourished gentleman, in no acute distress. Pulmonary.  Lungs clear bilaterally, normal respiratory effort. CV.  Regular rate and rhythm, no JVD, rub or murmur. Abdomen.  Soft, nontender, nondistended, BS positive. CNS.  Alert and oriented x2.  No focal neurologic deficit. Extremities.  No edema, no cyanosis, pulses intact and symmetrical. Psychiatry.  Judgment and insight appears impaired.  Flat affect  Data Reviewed: Prior notes, labs and images reviewed  Family Communication:  Disposition: Status is: Inpatient Remains inpatient appropriate because: Severity of illness   Planned Discharge Destination: Skilled nursing facility  DVT prophylaxis.  Lovenox Time spent: 50  minutes  This record has been created using Conservation officer, historic buildings. Errors have been sought and corrected,but may not always be located. Such creation errors do not reflect on the standard of care.  Author: Arnetha Courser, MD 08/29/2021 1:30 PM  For on call review [email or web address].

## 2021-08-30 DIAGNOSIS — Z7189 Other specified counseling: Secondary | ICD-10-CM | POA: Diagnosis not present

## 2021-08-30 DIAGNOSIS — Z515 Encounter for palliative care: Secondary | ICD-10-CM | POA: Diagnosis not present

## 2021-08-30 DIAGNOSIS — R5381 Other malaise: Secondary | ICD-10-CM | POA: Diagnosis not present

## 2021-08-30 DIAGNOSIS — R4182 Altered mental status, unspecified: Secondary | ICD-10-CM | POA: Diagnosis not present

## 2021-08-30 DIAGNOSIS — T796XXA Traumatic ischemia of muscle, initial encounter: Secondary | ICD-10-CM

## 2021-08-30 DIAGNOSIS — Z720 Tobacco use: Secondary | ICD-10-CM | POA: Diagnosis not present

## 2021-08-30 DIAGNOSIS — G9341 Metabolic encephalopathy: Secondary | ICD-10-CM | POA: Diagnosis not present

## 2021-08-30 LAB — MAGNESIUM: Magnesium: 2.1 mg/dL (ref 1.7–2.4)

## 2021-08-30 LAB — BASIC METABOLIC PANEL
Anion gap: 6 (ref 5–15)
BUN: 7 mg/dL — ABNORMAL LOW (ref 8–23)
CO2: 25 mmol/L (ref 22–32)
Calcium: 9 mg/dL (ref 8.9–10.3)
Chloride: 111 mmol/L (ref 98–111)
Creatinine, Ser: 0.56 mg/dL — ABNORMAL LOW (ref 0.61–1.24)
GFR, Estimated: >60 mL/min (ref >60)
Glucose, Bld: 119 mg/dL — ABNORMAL HIGH (ref 70–99)
Potassium: 3.3 mmol/L — ABNORMAL LOW (ref 3.5–5.1)
Sodium: 142 mmol/L (ref 135–145)

## 2021-08-30 LAB — CK: Total CK: 2347 U/L — ABNORMAL HIGH (ref 49–397)

## 2021-08-30 MED ORDER — PROSOURCE PLUS PO LIQD
30.0000 mL | Freq: Three times a day (TID) | ORAL | Status: DC
  Administered 2021-08-30 – 2021-09-02 (×3): 30 mL via ORAL

## 2021-08-30 MED ORDER — POTASSIUM CHLORIDE 20 MEQ PO PACK
40.0000 meq | PACK | Freq: Once | ORAL | Status: AC
  Administered 2021-08-30: 40 meq via ORAL
  Filled 2021-08-30: qty 2

## 2021-08-30 MED ORDER — LOSARTAN POTASSIUM 50 MG PO TABS
50.0000 mg | ORAL_TABLET | Freq: Every day | ORAL | Status: DC
  Administered 2021-08-30 – 2021-09-01 (×3): 50 mg via ORAL
  Filled 2021-08-30 (×3): qty 1

## 2021-08-30 MED ORDER — ADULT MULTIVITAMIN W/MINERALS CH
1.0000 | ORAL_TABLET | Freq: Every day | ORAL | Status: DC
  Administered 2021-08-30 – 2021-09-01 (×3): 1 via ORAL
  Filled 2021-08-30 (×3): qty 1

## 2021-08-30 NOTE — Progress Notes (Signed)
Initial Nutrition Assessment  DOCUMENTATION CODES:   Underweight  INTERVENTION:   -Continue Boost Breeze po TID, each supplement provides 250 kcal and 9 grams of protein  -30 ml Prosource Plus TID, each supplement provides 100 kcals and 15 grams protein -MVI with minerals daily  NUTRITION DIAGNOSIS:   Inadequate oral intake related to poor appetite as evidenced by meal completion < 25%.  GOAL:   Patient will meet greater than or equal to 90% of their needs  MONITOR:   PO intake, Supplement acceptance, Diet advancement  REASON FOR ASSESSMENT:   Consult Assessment of nutrition requirement/status  ASSESSMENT:   Pt with medical history significant for schizoaffective disorder, GERD, nephrolithiasis who was brought in for evaluation of weakness, mental status changes and multiple falls  Pt admitted with serotonin syndrome and rhabdomyolysis.   8/18- s/p BSE- dysphagia 2 diet with thin liquids 8/19- s/p BSE- downgraded to dysphagia 1 diet with thin liquids  Reviewed I/O's: +2.6 L x 24 hours and +6.2 L since admission  UOP: 2 L x 24 hours  Pt out of room at time of visit. No family or supports present at time of visit.   Case discussed with SLP; pt eating very little solid foods. Pt prefers to drink supplements and juices. He really likes the [employer]. Meal completions documented at 10%.   Reviewed wt hx; pt has experienced a 19.2% wt loss over the past 3 months, which is significant for time frame.   Highly suspect pt with malnutrition, however, unable to identify at this time.   Medications reviewed and include dextrose 5% in lactated ringers infusion @ 150 ml/hr.   Labs reviewed: K: 3.3, CBGS: 149.   Diet Order:   Diet Order             DIET - DYS 1 Room service appropriate? No; Fluid consistency: Thin  Diet effective now                   EDUCATION NEEDS:   Not appropriate for education at this time  Skin:  Skin Assessment: Reviewed RN  Assessment  Last BM:  08/29/21  Height:   Ht Readings from Last 1 Encounters:  08/26/21 6' (1.829 m)    Weight:   Wt Readings from Last 1 Encounters:  08/26/21 61.4 kg    Ideal Body Weight:  80.9 kg  BMI:  Body mass index is 18.36 kg/m.  Estimated Nutritional Needs:   Kcal:  5681-2751  Protein:  120-135 grams  Fluid:  > 2 L    Levada Schilling, RD, LDN, CDCES Registered Dietitian II Certified Diabetes Care and Education Specialist Please refer to [facility] for RD and/or RD on-call/weekend/after hours pager

## 2021-08-30 NOTE — Progress Notes (Signed)
Physical Therapy Treatment Patient Details Name: Christopher Valdez. MRN: [id number] DOB: [birth year] Today's Date: 08/30/2021   History of Present Illness Pt is a 61 y/o M admitted on 08/26/21 after presenting with c/o generalized weakness, AMS, & falls. Pt is being treated for acute metabolic encephalopathy, likely multifactoral. PMH: schizoaffective disorder, GERD, nephrolithiasis    PT Comments    Pt seen for PT tx with PT providing hand over hand assistance to hold cup to consume water. Pt attempting to converse with therapist at times, but with such low volume & unable to understand pt's words. Pt participates in initial portion of supine>sit but then stops & PT unable to assist pt to sitting EOB even with HOB fully elevated & total assist; pt continues to lean L with no active participation in uprighting trunk. Pt does however, initiate sit>supine with min assist & pt observed to be incontinent of BM. Pt requires max assist for rolling L<>R, assistance to place hand on bed rails, cuing to turn head/eyes to locate bed rails to allow PT/NT to perform peri hygiene & change pt into a new gown. Continue to recommend STR upon d/c to maximize independence with functional mobility, decrease caregiver burden, & reduce fall risk prior to return home.    Recommendations for follow up therapy are one component of a multi-disciplinary discharge planning process, led by the attending physician.  Recommendations may be updated based on patient status, additional functional criteria and insurance authorization.  Follow Up Recommendations  Skilled nursing-short term rehab (<3 hours/day) Can patient physically be transported by private vehicle: No   Assistance Recommended at Discharge Frequent or constant Supervision/Assistance  Patient can return home with the following Two people to help with walking and/or transfers;Two people to help with bathing/dressing/bathroom;Help with stairs or ramp for entrance;Direct  supervision/assist for medications management;Assist for transportation;Assistance with feeding;Assistance with cooking/housework;Direct supervision/assist for financial management   Equipment Recommendations  None recommended by PT    Recommendations for Other Services       Precautions / Restrictions Precautions Precautions: Fall Restrictions Weight Bearing Restrictions: No     Mobility  Bed Mobility Overal bed mobility: Needs Assistance Bed Mobility: Rolling Rolling: Max assist, +2 for physical assistance   Supine to sit: Total assist, HOB elevated Sit to supine: Min assist, HOB elevated        Transfers                        Ambulation/Gait                   Stairs             Wheelchair Mobility    Modified Rankin (Stroke Patients Only)       Balance Overall balance assessment: Needs assistance Sitting-balance support: Feet supported, Bilateral upper extremity supported Sitting balance-Leahy Scale: Zero                                      Cognition Arousal/Alertness: Awake/alert Behavior During Therapy: Flat affect Overall Cognitive Status: Impaired/Different from baseline Area of Impairment: Orientation, Attention, Memory, Following commands, Awareness, Safety/judgement, Problem solving                 Orientation Level: Disoriented to, Place, Situation, Time   Memory: Decreased short-term memory Following Commands: Follows one step commands inconsistently, Follows one step commands with increased time Safety/Judgement:  Decreased awareness of deficits, Decreased awareness of safety Awareness: Intellectual Problem Solving: Slow processing, Decreased initiation, Requires verbal cues, Requires tactile cues General Comments: Verbalizes but at a low volume so PT can hardly hear him, difficult to understand        Exercises      General Comments        Pertinent Vitals/Pain Pain Assessment Pain  Assessment: Faces Faces Pain Scale: No hurt    Home Living                          Prior Function            PT Goals (current goals can now be found in the care plan section) Acute Rehab PT Goals Patient Stated Goal: figure out what's going on, get better PT Goal Formulation: With family Time For Goal Achievement: 09/11/21 Potential to Achieve Goals: Fair Progress towards PT goals: PT to reassess next treatment    Frequency    Min 2X/week      PT Plan Current plan remains appropriate    Co-evaluation              AM-PAC PT "6 Clicks" Mobility   Outcome Measure  Help needed turning from your back to your side while in a flat bed without using bedrails?: A Lot Help needed moving from lying on your back to sitting on the side of a flat bed without using bedrails?: Total Help needed moving to and from a bed to a chair (including a wheelchair)?: Total Help needed standing up from a chair using your arms (e.g., wheelchair or bedside chair)?: Total Help needed to walk in hospital room?: Total Help needed climbing 3-5 steps with a railing? : Total 6 Click Score: 7    End of Session   Activity Tolerance: Patient tolerated treatment well Patient left: in bed;with call bell/phone within reach;with bed alarm set Nurse Communication: Mobility status PT Visit Diagnosis: Other abnormalities of gait and mobility (R26.89);Muscle weakness (generalized) (M62.81);Difficulty in walking, not elsewhere classified (R26.2)     Time: 5621-3086 PT Time Calculation (min) (ACUTE ONLY): 12 min  Charges:  $Therapeutic Activity: 8-22 mins                     Aleda Grana, PT, DPT 08/30/21, 9:47 AM  Sandi Mariscal 08/30/2021, 9:45 AM

## 2021-08-30 NOTE — Progress Notes (Signed)
Subjective: Lying comfortably in bed.   Objective: Current vital signs: BP (!) 157/98 (BP Location: Right Arm)   Pulse 75   Temp 99.9 F (37.7 C)   Resp 19   Ht 6' (1.829 m)   Wt 61.4 kg   SpO2 94%   BMI 18.36 kg/m  Vital signs in last 24 hours: Temp:  [97.7 F (36.5 C)-99.9 F (37.7 C)] 99.9 F (37.7 C) (08/21 0830) Pulse Rate:  [61-78] 75 (08/21 0830) Resp:  [16-20] 19 (08/21 0830) BP: (157-171)/(72-98) 157/98 (08/21 0830) SpO2:  [94 %-99 %] 94 % (08/21 0830)  Intake/Output from previous day: 08/20 0701 - 08/21 0700 In: 4553.4 [P.O.:720; I.V.:3833.4] Out: 2000 [Urine:2000] Intake/Output this shift: No intake/output data recorded. Nutritional status:  Diet Order             DIET - DYS 1 Room service appropriate? No; Fluid consistency: Thin  Diet effective now                  Physical Exam Gen: Awake and alert. Poor dentition.  Resp: normal WOB Ext: No edema   Neuro: *Ment: Alert, oriented to self, [facility], [place], [place], Monday and August. Abulic with increased latencies of motor and verbal responses. No agitation noted. Follows commands. Does not seem motivated or able to answer open ended questions requiring full-sentence replies. Speech is hypophonic with moderate dysarthria and is sparse, but fluent. Yawns frequently.  *CN: PERRL. Will track to the left and right with hesitant, saccadic pursuits. Also with hypometric saccades. Decreased blink rate with wide-eyed staring quality to his gaze. No facial droop. Hearing intact to voice *Motor: Moderate symmetric leadpipe rigidity BUE>BLE. Antigravity BUE and will resist examiner with 4/5 strength bilaterally. Some movement but not against gravity BLE, with 3/5 HF in the context of poor effort, 4/5 KE and 4+/5 ADF and APF. All symmetric *Sensory: Intact to FT x 4.  *Reflexes: 3+ throughout except 4+ bilat achilles  Lab Results: Results for orders placed or performed during the hospital encounter of 08/26/21 (from  the past 48 hour(s))  RPR     Status: None   Collection Time: 08/28/21 12:14 PM  Result Value Ref Range   RPR Ser Ql NON REACTIVE NON REACTIVE    Comment: Performed at [facility], [address] [place], [place] [address]  Glucose, capillary     Status: Abnormal   Collection Time: 08/28/21  5:06 PM  Result Value Ref Range   Glucose-Capillary 149 (H) 70 - 99 mg/dL    Comment: Glucose reference range applies only to samples taken after fasting for at least 8 hours.  CK     Status: Abnormal   Collection Time: 08/29/21  6:11 AM  Result Value Ref Range   Total CK 4,614 (H) 49 - 397 U/L    Comment: RESULT CONFIRMED BY MANUAL DILUTION MJU Performed at [facility], [address] [place], [place] [address]   Basic metabolic panel     Status: Abnormal   Collection Time: 08/29/21  6:11 AM  Result Value Ref Range   Sodium 144 135 - 145 mmol/L   Potassium 2.9 (L) 3.5 - 5.1 mmol/L   Chloride 113 (H) 98 - 111 mmol/L   CO2 26 22 - 32 mmol/L   Glucose, Bld 117 (H) 70 - 99 mg/dL    Comment: Glucose reference range applies only to samples taken after fasting for at least 8 hours.   BUN 9 8 - 23 mg/dL  Creatinine, Ser 0.63 0.61 - 1.24 mg/dL   Calcium 8.9 8.9 - 33.2 mg/dL   GFR, Estimated >95 >18 mL/min    Comment: (NOTE) Calculated using the CKD-EPI Creatinine Equation (2021)    Anion gap 5 5 - 15    Comment: Performed at [facility], [address] [place], [place] [address]  Magnesium     Status: None   Collection Time: 08/29/21  6:11 AM  Result Value Ref Range   Magnesium 1.7 1.7 - 2.4 mg/dL    Comment: Performed at [facility], [address] [place], [place] [address]  CBC     Status: Abnormal   Collection Time: 08/29/21  6:11 AM  Result Value Ref Range   WBC 9.5 4.0 - 10.5 K/uL   RBC 4.02 (L) 4.22 - 5.81 MIL/uL   Hemoglobin 11.7 (L) 13.0 - 17.0 g/dL   HCT 60.1 (L) 09.3 - 23.5 %   MCV 88.1 80.0 - 100.0 fL   MCH 29.1 26.0 - 34.0 pg    MCHC 33.1 30.0 - 36.0 g/dL   RDW 57.3 22.0 - 25.4 %   Platelets 211 150 - 400 K/uL   nRBC 0.0 0.0 - 0.2 %    Comment: Performed at [facility], [address] [place], [place] [address]  CK     Status: Abnormal   Collection Time: 08/30/21  6:32 AM  Result Value Ref Range   Total CK 2,347 (H) 49 - 397 U/L    Comment: Performed at [facility], [address] [place], [place] [address]  Basic metabolic panel     Status: Abnormal   Collection Time: 08/30/21  6:32 AM  Result Value Ref Range   Sodium 142 135 - 145 mmol/L   Potassium 3.3 (L) 3.5 - 5.1 mmol/L   Chloride 111 98 - 111 mmol/L   CO2 25 22 - 32 mmol/L   Glucose, Bld 119 (H) 70 - 99 mg/dL    Comment: Glucose reference range applies only to samples taken after fasting for at least 8 hours.   BUN 7 (L) 8 - 23 mg/dL   Creatinine, Ser 3.15 (L) 0.61 - 1.24 mg/dL   Calcium 9.0 8.9 - 17.6 mg/dL   GFR, Estimated >16 >07 mL/min    Comment: (NOTE) Calculated using the CKD-EPI Creatinine Equation (2021)    Anion gap 6 5 - 15    Comment: Performed at [facility], [address] [place], [place] [address]  Magnesium     Status: None   Collection Time: 08/30/21  6:32 AM  Result Value Ref Range   Magnesium 2.1 1.7 - 2.4 mg/dL    Comment: Performed at [facility], [address], [place], [place] [address]    Recent Results (from the past 240 hour(s))  Blood culture (routine x 2)     Status: None (Preliminary result)   Collection Time: 08/26/21  9:31 AM   Specimen: BLOOD  Result Value Ref Range Status   Specimen Description BLOOD LEFT FOREARM  Final   Special Requests   Final    BOTTLES DRAWN AEROBIC AND ANAEROBIC Blood Culture results may not be optimal due to an excessive volume of blood received in culture bottles   Culture   Final    NO GROWTH 4 DAYS Performed at [facility], [address] [place], [place] [address]    Report Status PENDING  Incomplete  Blood culture  (routine x 2)  Status: None (Preliminary result)   Collection Time: 08/26/21  9:31 AM   Specimen: BLOOD  Result Value Ref Range Status   Specimen Description BLOOD RIGHT AC  Final   Special Requests   Final    BOTTLES DRAWN AEROBIC AND ANAEROBIC Blood Culture results may not be optimal due to an excessive volume of blood received in culture bottles   Culture   Final    NO GROWTH 4 DAYS Performed at [facility], [address] [place], [place] [address]    Report Status PENDING  Incomplete  CSF culture w Gram Stain     Status: None   Collection Time: 08/26/21  9:31 AM   Specimen: CSF; Cerebrospinal Fluid  Result Value Ref Range Status   Specimen Description   Final    CSF Performed at [facility], [address] [place], [place] [address]    Special Requests   Final    NONE Performed at [facility], [address], [place], [place] [address]    Gram Stain   Final    NO ORGANISMS SEEN RED BLOOD CELLS WBC SEEN Performed at [facility], [address], [place], [place] [address]    Culture   Final    NO GROWTH 3 DAYS Performed at [facility], [address], [place], [place] [address]    Report Status 08/29/2021 FINAL  Final  MRSA Next Gen by PCR, Nasal     Status: None   Collection Time: 08/26/21  3:29 PM   Specimen: Nasal Swab  Result Value Ref Range Status   MRSA by PCR Next Gen NOT DETECTED NOT DETECTED Final    Comment: (NOTE) The GeneXpert MRSA Assay (FDA approved for NASAL specimens only), is one component of a comprehensive MRSA colonization surveillance program. It is not intended to diagnose MRSA infection nor to guide or monitor treatment for MRSA infections. Test performance is not FDA approved in patients less than 73 years old. Performed at [facility], [address] [place], [place] [address]     Lipid Panel No results for input(s): "CHOL", "TRIG", "HDL", "CHOLHDL", "VLDL", "LDLCALC" in  the last 72 hours.  Studies/Results: No results found.  Medications: Scheduled:  Chlorhexidine Gluconate Cloth  6 each Topical Daily   enoxaparin (LOVENOX) injection  40 mg Subcutaneous Q24H   feeding supplement  1 Container Oral TID BM   losartan  50 mg Oral Daily   nicotine  21 mg Transdermal Daily   mouth rinse  15 mL Mouth Rinse 4 times per day   potassium chloride  40 mEq Oral Once   Continuous:  dextrose 5% lactated ringers 150 mL/hr at 08/30/21 0129   Assessment: 61 yo male with a PMHx of schizoaffective disorder admitted from group home for generalized weakness, worsening encephalopathy, and multiple falls. Exam notable for severe rigidity and hyperreflexia.  - Exam today: Unchanged relative to yesterday's exam based on documentation and sign out with Dr. Selina Cooley.  - MRI brain: No acute intracranial process. No evidence of acute or subacute infarct. The postcontrast sequences are somewhat motion limited; within this limitation, no abnormal enhancement is seen. - Repeat EEG today: Continuous slow, generalized. This study is suggestive of moderate diffuse encephalopathy, nonspecific to etiology. No seizures or epileptiform discharges were seen throughout the recording. - UDS was negative except for benzodiazepines.  - RPR non-reactive - DDx:  - We do not feel that his presentation is due to medication effect; as he  is not currently on a neuroleptic nor multiple serotonergic medications, and he has neither been tachycardic nor hyperthermic.  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 Neurosyphilis was possible but low on the DDx and RPR is negative.    - Will send paraneoplastic workup on CSF and serum given smoking hx and 40 lb weight loss over past 6 mos.  - Suspect possible Parkinson's dementia or Parkinson's plus syndrome with dementia based on the following exam features: Hypomimia, abulia, decreased blink rate, abnormal oculomotor exam suggestive of impaired function of  his frontal and or parietal eye fields, lead pipe rigidity.  - DDx for his motor exam features also includes long term side effects of neuroleptics. Of note, he is on clozapine outpatient and also takes Ingrezza (a vesicular monoamine transport inhibitor, aka VMAT; this medication is used to treat tardive dyskinesia; per the literature, it has been present in drug regimens of patients presenting with NMS in a case series, but possible evidence for causality was confounded by concomitant treatment with antipsychotics)   Recommendations: - Paraneoplastic panels on serum and CSF (ordered) - Continue to monitor CK levels   LOS: 4 days   @Electronically  signed: Dr. 08/30/2021  8:35 AM
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
## 2021-08-30 NOTE — Assessment & Plan Note (Signed)
Blood pressure remained elevated. -Increase losartan to 50 mg daily

## 2021-08-30 NOTE — Assessment & Plan Note (Signed)
Likely multifactorial.  Discussed with ID, neurology and reviewed psychiatry input EEG did not show any seizure activity Unlikely neuroleptic malignant or serotonin syndrome CSF positive for HHV-6 but per discussion with ID likely not significant RPR negative. MRI brain was negative for any acute abnormality. Cannot rule out paraneoplastic syndrome and or autoimmune encephalitis considering significant weight loss of 30-40 pounds since January -Follow-up  paraneoplastic labs.

## 2021-08-30 NOTE — Progress Notes (Signed)
Eeg done 

## 2021-08-30 NOTE — Progress Notes (Signed)
S: Patient is more confused today, oriented to first name only. No new complaints today, although he is unlikely to be able to voice them if he did.  MRI brain wwo contrast motion degraded, with that caveat no acute findings or findings to explain sx (personal review)  CK downtrending 9254 -> 4614  O:  Vitals:   08/29/21 2348 08/30/21 0324  BP: (!) 157/81 (!) 169/72  Pulse: 70 67  Resp: 20 20  Temp: 98.2 F (36.8 C) 98.4 F (36.9 C)  SpO2: 99% 98%   Physical Exam Gen: alert, oriented to self and [facility], able to follow some simple commands Resp: normal WOB CV: RRR   Neuro: *MS: alert, oriented to self and [facility], able to follow some simple commands *Speech: moderate dysarthria, impaired naming and repetition *CN: PERRL, blinks to threat bilat, EOMI, sensation intact, face symmetric at rest, hearing intact to voice *Motor: moderate leadpipe ridity BUE>BLE, symmetric. Antigravity BUE, some movement but not against gravity BLE, all symmetric *Sensory: SILT *Reflexes: 3+ throughout except 4+ bilat achilles  A/P: 61 yo man with hx schizoaffective disorder admitted from group home for generalized weakness, worsening encephalopathy, and multiple falls. Exam notable for severe rigidity and hyperreflexia. I do not feel this is medication effect; he is not currently on a neuroleptic nor multiple serotonergic medications, and he has neither been tachycardic nor hyperthermic. Will order brain MRI wwo contrast tonight for further evaluation. ID favors HHV6 in CSF to be a red herring. Neurosyphilis may be a possibility, will order RPR in serum and if positive consider retapping for VDRL. Will send paraneoplastic workup on CSF and serum given smoking hx and 40 lb weight loss over past 6 mos. Given his fluctuating mental status will obtain repeat rEEG for tmrw.  - Serum RPR - rEEG - Paraneoplastic panels on serum and CSF - Will talk to family for additional collateral hx -  Will continue to follow  Bing Neighbors, MD [facility] [phone]  If 7pm- 7am, please page neurology on call as listed in AMION.

## 2021-08-30 NOTE — Progress Notes (Signed)
   Date of Admission:  08/26/2021    Sisters at bed side  ID: Emori Kamau. is a 61 y.o. male  Principal Problem:   Acute metabolic encephalopathy Active Problems:   Other schizoaffective disorders (HCC)   Tobacco use   Essential hypertension   Hypokalemia   COPD (chronic obstructive pulmonary disease) (HCC)   Protein-calorie malnutrition, severe (HCC)   Hypernatremia   Rhabdomyolysis    Subjective: Pt is more alert today Responds to some questions  Medications:   Chlorhexidine Gluconate Cloth  6 each Topical Daily   enoxaparin (LOVENOX) injection  40 mg Subcutaneous Q24H   feeding supplement  1 Container Oral TID BM   losartan  50 mg Oral Daily   nicotine  21 mg Transdermal Daily   mouth rinse  15 mL Mouth Rinse 4 times per day    Objective: Vital signs in last 24 hours: Temp:  [97.7 F (36.5 C)-99.9 F (37.7 C)] 99.9 F (37.7 C) (08/21 0830) Pulse Rate:  [66-78] 75 (08/21 0830) Resp:  [19-20] 19 (08/21 0830) BP: (157-171)/(72-98) 157/98 (08/21 0830) SpO2:  [94 %-99 %] 94 % (08/21 0830)    PHYSICAL EXAM:  General: Awake, some conversation   Lungs: b/l air entry. Heart: Regular rate and rhythm, no murmur, rub or gallop. Abdomen: Soft, non-tender,not distended. Bowel sounds normal. No masses Skin: some bruising over left side Lymph: Cervical, supraclavicular normal. Neurologic: cog wheel rigidity tremors  Lab Results Recent Labs    08/28/21 0630 08/29/21 0611 08/30/21 0632  WBC 10.9* 9.5  --   HGB 11.5* 11.7*  --   HCT 35.2* 35.4*  --   NA 140 144 142  K 3.1* 2.9* 3.3*  CL 109 113* 111  CO2 24 26 25   BUN 20 9 7*  CREATININE 0.66 0.63 0.56*   Microbiology: BC- NG    Assessment/Plan: ?Pt admitted with altered mental status and found to have rigidity,increase in CK and tremors  Neuroleptic malignant syndrome unlikely HE also has some cog wheel rigidity -- possible  parkinsonism  Falls- could explain rhabdo   LP not suggestive of  infecitous etiology- WBC 7, protein 54 -   HHV6 PCR in CSF is questionable significance-I dont think this is reactivation and also HHV6 can be integrated in DNA  in 1-2% of population No need for any treatment now May check HHV^ 6 serum and csf quantitative PCR   R/o paraneoplastic syndrome /autoimmune encephalitis with weight loss, change in mental status ? ?Hypernatremia could be from dehydration Leucocytosis - has resolved  Discussed the management with the care team

## 2021-08-30 NOTE — Assessment & Plan Note (Signed)
Resolved with IV hydration. -Continue to monitor 

## 2021-08-30 NOTE — Procedures (Signed)
Patient Name: Christopher Valdez.  MRN: [id number]  Epilepsy Attending: Charlsie Quest  Referring Physician/Provider: Jefferson Fuel, MD  Date: 08/30/2021 Duration: 25.52 mins  Patient history:  61 yo man with hx schizoaffective disorder admitted from group home for generalized weakness, worsening encephalopathy, and multiple falls. EEG to evaluate for seizure  Level of alertness: Awake  AEDs during EEG study: None  Technical aspects: This EEG study was done with scalp electrodes positioned according to the 10-20 International system of electrode placement. Electrical activity was reviewed with band pass filter of 1-70Hz , sensitivity of 7 uV/mm, display speed of 63mm/sec with a 60Hz  notched filter applied as appropriate. EEG data were recorded continuously and digitally stored.  Video monitoring was available and reviewed as appropriate.  Description: No clear posterior dominant rhythm was seen.  EEG showed continuous generalized predominantly 5 to 7 Hz theta slowing admixed with intermittent generalized 2 to 3 Hz delta slowing. Physiologic photic driving was not seen during photic stimulation. Hyperventilation was not performed.     ABNORMALITY - Continuous slow, generalized  IMPRESSION: This study is suggestive of moderate diffuse encephalopathy, nonspecific etiology. No seizures or epileptiform discharges were seen throughout the recording.   Jameya Pontiff 

## 2021-08-30 NOTE — Plan of Care (Signed)
  Problem: Education: Goal: Knowledge of General Education information will improve Description: Including pain rating scale, medication(s)/side effects and non-pharmacologic comfort measures 08/30/2021 0631 by Precious Bard, RN Outcome: Progressing 08/29/2021 2300 by Precious Bard, RN Outcome: Progressing   Problem: Health Behavior/Discharge Planning: Goal: Ability to manage health-related needs will improve 08/30/2021 0631 by Precious Bard, RN Outcome: Progressing 08/29/2021 2300 by Precious Bard, RN Outcome: Progressing   Problem: Clinical Measurements: Goal: Ability to maintain clinical measurements within normal limits will improve 08/30/2021 0631 by Precious Bard, RN Outcome: Progressing 08/29/2021 2300 by Precious Bard, RN Outcome: Progressing Goal: Will remain free from infection 08/30/2021 0631 by Precious Bard, RN Outcome: Progressing 08/29/2021 2300 by Precious Bard, RN Outcome: Progressing Goal: Diagnostic test results will improve 08/30/2021 0631 by Precious Bard, RN Outcome: Progressing 08/29/2021 2300 by Precious Bard, RN Outcome: Progressing Goal: Respiratory complications will improve 08/30/2021 0631 by Precious Bard, RN Outcome: Progressing 08/29/2021 2300 by Precious Bard, RN Outcome: Progressing Goal: Cardiovascular complication will be avoided 08/30/2021 0631 by Precious Bard, RN Outcome: Progressing 08/29/2021 2300 by Precious Bard, RN Outcome: Progressing   Problem: Activity: Goal: Risk for activity intolerance will decrease 08/30/2021 0631 by Precious Bard, RN Outcome: Progressing 08/29/2021 2300 by Precious Bard, RN Outcome: Progressing   Problem: Nutrition: Goal: Adequate nutrition will be maintained 08/30/2021 0631 by Precious Bard, RN Outcome:  Progressing 08/29/2021 2300 by Precious Bard, RN Outcome: Progressing   Problem: Coping: Goal: Level of anxiety will decrease 08/30/2021 0631 by Precious Bard, RN Outcome: Progressing 08/29/2021 2300 by Precious Bard, RN Outcome: Progressing   Problem: Elimination: Goal: Will not experience complications related to bowel motility 08/30/2021 0631 by Precious Bard, RN Outcome: Progressing 08/29/2021 2300 by Precious Bard, RN Outcome: Progressing Goal: Will not experience complications related to urinary retention 08/30/2021 0631 by Precious Bard, RN Outcome: Progressing 08/29/2021 2300 by Precious Bard, RN Outcome: Progressing   Problem: Pain Managment: Goal: General experience of comfort will improve 08/30/2021 0631 by Precious Bard, RN Outcome: Progressing 08/29/2021 2300 by Precious Bard, RN Outcome: Progressing   Problem: Safety: Goal: Ability to remain free from injury will improve 08/30/2021 0631 by Precious Bard, RN Outcome: Progressing 08/29/2021 2300 by Precious Bard, RN Outcome: Progressing   Problem: Skin Integrity: Goal: Risk for impaired skin integrity will decrease 08/30/2021 0631 by Precious Bard, RN Outcome: Progressing 08/29/2021 2300 by Precious Bard, RN Outcome: Progressing

## 2021-08-30 NOTE — Progress Notes (Signed)
Progress Note   Patient: Christopher Valdez. DGL:[id number] DOB: 08-16-1960 DOA: 08/26/2021     4 DOS: the patient was seen and examined on 08/30/2021   Brief hospital course: 61 y.o. male with medical history significant for schizoaffective disorder, GERD, nephrolithiasis who was sent over from group home for evaluation of generalized weakness, altered mental status and multiple falls.  8/17: Psychiatry, ID consult, stepdown status 8/18: Neurology consult, EEG showed no seizure, moving out to floors 8/19: Patient continues to remain altered and further work-up per neurology pending.  CK levels continue to remain elevated and patient remains on IV fluid.  8/20: MRI brain was negative for any acute abnormality.  CK level started trending down, at 4614 today.  Paraneoplastic and autoimmune labs are still pending.  RPR pending. Losartan 25 mg was added for persistently elevated blood pressure. 8/21: Patient with food pocketing in his mouth requiring a lot of reminders to swallow.  Drinking boost without any problem. Diet can be switched to full liquid. Blood pressure remained elevated-losartan increased to 50 mg daily.  Potassium at 3.3 which is being repleted. CK continues to trend down, at 2347 today. RPR negative ParaNeoplastic panel pending. EEG done today which shows moderate diffuse encephalopathy.  No seizures or epileptiform changes were seen. PT/OT recommending SNF.   Assessment and Plan: * Acute metabolic encephalopathy Likely multifactorial.  Discussed with ID, neurology and reviewed psychiatry input EEG did not show any seizure activity Unlikely neuroleptic malignant or serotonin syndrome CSF positive for HHV-6 but per discussion with ID likely not significant RPR negative. MRI brain was negative for any acute abnormality. Cannot rule out paraneoplastic syndrome and or autoimmune encephalitis considering significant weight loss of 30-40 pounds since January -Follow-up   paraneoplastic labs.    Rhabdomyolysis Improving, CK at 2347 today Continue aggressive IV fluid hydration.  He has had multiple falls which could be the etiology Hold statins CK levels started trending down. -Continue with IV hydration and monitor  Essential hypertension Blood pressure remained elevated. -Increase losartan to 50 mg daily  Protein-calorie malnutrition, severe (HCC) Estimated body mass index is 18.36 kg/m as calculated from the following:   Height as of this encounter: 6' (1.829 m).   Weight as of this encounter: 61.4 kg.  Complicates overall prognosis.  Per his sister he has had about 30 to 40 pound weight loss since January of this year.  She reports significant clinical and functional decline over last six 1 to 1-year.  -Dietitian consult  Other schizoaffective disorders (HCC) Hold all antipsychotic agents for now.  Seen by psychiatry.  Currently on as needed Ativan  Hypernatremia Resolved with IV hydration. -Continue to monitor  Tobacco use Counseled.  Nicotine patch ordered  Hypokalemia Potassium at 2.9 with magnesium of 1.7. -Replete electrolytes and monitor  COPD (chronic obstructive pulmonary disease) (HCC) Not acutely exacerbated  as needed bronchodilator therapy.     Subjective: Patient still unable to participate with any meaningful conversation.  Denies any pain.  Physical Exam: Vitals:   08/29/21 2018 08/29/21 2348 08/30/21 0324 08/30/21 0830  BP: (!) 168/79 (!) 157/81 (!) 169/72 (!) 157/98  Pulse: 66 70 67 75  Resp: 20 20 20 19   Temp: 99.2 F (37.3 C) 98.2 F (36.8 C) 98.4 F (36.9 C) 99.9 F (37.7 C)  TempSrc: Oral  Oral   SpO2: 95% 99% 98% 94%  Weight:      Height:       General.  cognitively impaired, malnourished gentleman, in no  acute distress. Pulmonary.  Lungs clear bilaterally, normal respiratory effort. CV.  Regular rate and rhythm, no JVD, rub or murmur. Abdomen.  Soft, nontender, nondistended, BS positive. CNS.   Alert and oriented x2.  No focal neurologic deficit. Extremities.  No edema, no cyanosis, pulses intact and symmetrical. Psychiatry.  Judgment and insight appears impaired  Data Reviewed: Prior data reviewed.  Family Communication:   Disposition: Status is: Inpatient Remains inpatient appropriate because: Severity of illness   Planned Discharge Destination: Skilled nursing facility  DVT prophylaxis.  Lovenox Time spent: 43 minutes  This record has been created using Conservation officer, historic buildings. Errors have been sought and corrected,but may not always be located. Such creation errors do not reflect on the standard of care.  Author: Arnetha Courser, MD 08/30/2021 3:59 PM  For on call review [email or web address].

## 2021-08-30 NOTE — Assessment & Plan Note (Signed)
Improving, CK at 2347 today Continue aggressive IV fluid hydration.  He has had multiple falls which could be the etiology Hold statins CK levels started trending down. -Continue with IV hydration and monitor

## 2021-08-31 DIAGNOSIS — R627 Adult failure to thrive: Secondary | ICD-10-CM

## 2021-08-31 DIAGNOSIS — Z720 Tobacco use: Secondary | ICD-10-CM | POA: Diagnosis not present

## 2021-08-31 DIAGNOSIS — Z515 Encounter for palliative care: Secondary | ICD-10-CM | POA: Diagnosis not present

## 2021-08-31 DIAGNOSIS — Z7189 Other specified counseling: Secondary | ICD-10-CM | POA: Diagnosis not present

## 2021-08-31 DIAGNOSIS — G9341 Metabolic encephalopathy: Secondary | ICD-10-CM | POA: Diagnosis not present

## 2021-08-31 LAB — BASIC METABOLIC PANEL
Anion gap: 6 (ref 5–15)
BUN: 8 mg/dL (ref 8–23)
CO2: 27 mmol/L (ref 22–32)
Calcium: 9 mg/dL (ref 8.9–10.3)
Chloride: 108 mmol/L (ref 98–111)
Creatinine, Ser: 0.7 mg/dL (ref 0.61–1.24)
GFR, Estimated: >60 mL/min (ref >60)
Glucose, Bld: 112 mg/dL — ABNORMAL HIGH (ref 70–99)
Potassium: 3.5 mmol/L (ref 3.5–5.1)
Sodium: 141 mmol/L (ref 135–145)

## 2021-08-31 LAB — CK: Total CK: 813 U/L — ABNORMAL HIGH (ref 49–397)

## 2021-08-31 MED ORDER — CLOZAPINE 25 MG PO TABS: 50.0000 mg | ORAL_TABLET | Freq: Two times a day (BID) | ORAL | Status: DC

## 2021-08-31 MED ORDER — CLOZAPINE 100 MG PO TABS: 100.0000 mg | ORAL_TABLET | Freq: Two times a day (BID) | ORAL | Status: DC

## 2021-08-31 MED ORDER — CLOZAPINE 25 MG PO TABS
12.5000 mg | ORAL_TABLET | Freq: Every day | ORAL | Status: AC
  Administered 2021-08-31: 12.5 mg via ORAL
  Filled 2021-08-31: qty 1

## 2021-08-31 MED ORDER — CLOZAPINE 25 MG PO TABS: 75.0000 mg | ORAL_TABLET | Freq: Two times a day (BID) | ORAL | Status: DC

## 2021-08-31 MED ORDER — NYSTATIN 100000 UNIT/ML MT SUSP
5.0000 mL | Freq: Four times a day (QID) | OROMUCOSAL | Status: DC
  Administered 2021-08-31 – 2021-09-02 (×6): 500000 [IU] via ORAL
  Filled 2021-08-31 (×7): qty 5

## 2021-08-31 MED ORDER — VALBENAZINE TOSYLATE 40 MG PO CAPS
80.0000 mg | ORAL_CAPSULE | Freq: Every day | ORAL | Status: DC
  Administered 2021-08-31 – 2021-09-02 (×3): 80 mg via ORAL
  Filled 2021-08-31 (×3): qty 2

## 2021-08-31 MED ORDER — CLOZAPINE 25 MG PO TABS
25.0000 mg | ORAL_TABLET | Freq: Two times a day (BID) | ORAL | Status: DC
  Administered 2021-09-02: 25 mg via ORAL
  Filled 2021-08-31: qty 1

## 2021-08-31 MED ORDER — DIVALPROEX SODIUM ER 500 MG PO TB24
500.0000 mg | ORAL_TABLET | Freq: Every day | ORAL | Status: DC
  Administered 2021-08-31 – 2021-09-01 (×2): 500 mg via ORAL
  Filled 2021-08-31 (×2): qty 1

## 2021-08-31 MED ORDER — FLUOXETINE HCL 20 MG PO CAPS
40.0000 mg | ORAL_CAPSULE | Freq: Every day | ORAL | Status: DC
  Administered 2021-08-31 – 2021-09-02 (×3): 40 mg via ORAL
  Filled 2021-08-31 (×3): qty 2

## 2021-08-31 MED ORDER — CLOZAPINE 25 MG PO TABS
25.0000 mg | ORAL_TABLET | Freq: Every day | ORAL | Status: AC
  Administered 2021-09-01: 25 mg via ORAL
  Filled 2021-08-31: qty 1

## 2021-08-31 NOTE — Progress Notes (Addendum)
Palliative:  HPI: 61 y.o. male  with past medical history of schizoeffective disorder, COPD, GERD, nephrolithiasis admitted on 08/26/2021 with declining functional status and falls. Recent hospitalization July 2023 for aspiration pneumonia. Psychiatry, ID, and neurology have been following for work up.   I met today at Christopher Valdez bedside with SLP and sister, Christopher Valdez. Christopher Valdez seems distressed by an episode when he felt he was choking on food and SLP explains that he is tolerating liquids before than solids of any texture. Recommendation for full liquid diet for his comfort.  I met with Christopher Valdez outside room. We had a discussion about Christopher Valdez's progression to his current status. Christopher Valdez has had a discussion with her sister, Christopher Valdez, yesterday. They have been discussing goals of care and path forward. They do not desire aggressive measures to prolong his life in his current state. They do not want a feeding tube. They also now recognize that his decline has progressed so significantly that he is not expected to improve to a good quality of life. Christopher Valdez shares that although clear answers would be good they are more interested in focusing on his comfort and happiness recognizing that it is too late for any treatment that is to a good quality of life. Overall failure to thrive. Christopher Valdez recognizes that Christopher Valdez presents similar to her mother in the end stages of her Parkinson's disease so they recognize poor prognosis. We discussed options for placement including SNF rehab, long term care with hospice, hospice facility. Family agrees with hospice involvement and have had experience with their parents. They are hopeful that Christopher Valdez can be tucked in somewhere he can remain for the rest of his life. We discussed that we will have to consider Medicaid and if this could cover placement for him to have hospice. I also believe he could be nearing the point of hospice facility placement and we could probably consider this as well. We will have TOC  assist with options available. Also important to family to continue Christopher Valdez's psych meds as this is part of his comfort and wellbeing.   All questions/concerns addressed. Emotional support provided. Updated medical team.   Exam: Alert, oriented to person and place. No distress. Appears anxious at times today (about choking it seems). Breathing regular, unlabored. Abd flat. Generalized weakness. Unable to lift cup to his mouth to drink water.   Plan: - DNR - Long term care with hospice vs hospice facility.  - Restart psych meds: clozapine, depakote, fluoxetine, Ingrezza.   Christopher Fayette, NP Palliative Medicine Team Pager [phone] (Please see [email or web address] for schedule) Team Phone [phone]    Greater than 50%  of this time was spent counseling and coordinating care related to the above assessment and plan

## 2021-08-31 NOTE — NC FL2 (Signed)
[place] MEDICAID FL2 LEVEL OF CARE SCREENING TOOL     IDENTIFICATION  Patient Name: Christopher Valdez. Birthdate: 11-04-1960 Sex: male Admission Date (Current Location): 08/26/2021  [facility] and [place] Number:  Chiropodist and Address:  [facility], [address], [place], [place] [address]      Provider Number: [id number]  Attending Physician Name and Address:  Arnetha Courser, MD  Relative Name and Phone Number:  Kela Millin     [phone]    Current Level of Care: Hospital Recommended Level of Care: Skilled Nursing Facility Prior Approval Number:    Date Approved/Denied:   PASRR Number: pending, clinical sent 08/31/2021  Discharge Plan: SNF    Current Diagnoses: Patient Active Problem List   Diagnosis Date Noted   Acute metabolic encephalopathy 08/27/2021   Hypernatremia 08/26/2021   Rhabdomyolysis 08/26/2021   Atherosclerosis of aorta (HCC) 08/03/2021   Protein-calorie malnutrition, severe (HCC) 08/03/2021   Other emphysema (HCC) 08/03/2021   Anemia 07/21/2021   Hypokalemia 07/21/2021   COPD (chronic obstructive pulmonary disease) (HCC) 07/21/2021   Pressure injury of skin 05/02/2021   Essential hypertension 02/17/2020   Other disorders of kidney and ureter in diseases classified elsewhere 02/17/2020   Hydronephrosis of right kidney 04/03/2018   Right kidney stone 04/03/2018   Avitaminosis D 10/29/2014   Enlarged prostate 10/29/2014   Other schizoaffective disorders (HCC) 10/10/2014   Dyslipidemia 10/10/2014   History of kidney stones 10/10/2014   External hemorrhoid 10/10/2014   Chronic constipation 10/10/2014   Tobacco use 10/10/2014   GERD without esophagitis 10/10/2014    Orientation RESPIRATION BLADDER Height & Weight     Self  Normal Continent Weight: 61.4 kg Height:  6' (182.9 cm)  BEHAVIORAL SYMPTOMS/MOOD NEUROLOGICAL BOWEL NUTRITION STATUS      Continent Diet (DYS 1)  AMBULATORY STATUS COMMUNICATION  OF NEEDS Skin   Extensive Assist (was not able to ambulate on assistance) Verbally Skin abrasions, Bruising (Bilateral arms)                       Personal Care Assistance Level of Assistance  Bathing, Dressing, Feeding Bathing Assistance: Maximum assistance Feeding assistance: Limited assistance Dressing Assistance: Maximum assistance     Functional Limitations Info  Sight, Hearing, Speech Sight Info: Adequate Hearing Info: Adequate Speech Info: Impaired (dysarthria)    SPECIAL CARE FACTORS FREQUENCY  PT (By licensed PT), OT (By licensed OT)     PT Frequency: Min 5x weekly OT Frequency: Min 5x weekly            Contractures      Additional Factors Info  Code Status, Allergies Code Status Info: DNR Allergies Info: Tall Ragweed           Current Medications (08/31/2021):  This is the current hospital active medication list Current Facility-Administered Medications  Medication Dose Route Frequency Provider Last Rate Last Admin   (feeding supplement) PROSource Plus liquid 30 mL  30 mL Oral TID BM Arnetha Courser, MD   30 mL at 08/30/21 1536   Chlorhexidine Gluconate Cloth 2 % PADS 6 each  6 each Topical Daily Agbata, Tochukwu, MD   6 each at 08/31/21 0912   dextrose 5 % in lactated ringers infusion   Intravenous Continuous Maurilio Lovely D, DO 150 mL/hr at 08/31/21 0916 Restarted at 08/31/21 0916   enoxaparin (LOVENOX) injection 40 mg  40 mg Subcutaneous Q24H Agbata, Tochukwu, MD   40 mg at 08/30/21 2029  feeding supplement (BOOST / RESOURCE BREEZE) liquid 1 Container  1 Container Oral TID BM Sherryll Burger, Pratik D, DO   1 Container at 08/31/21 0912   hydrALAZINE (APRESOLINE) injection 10 mg  10 mg Intravenous Q6H PRN Manuela Schwartz, NP   10 mg at 08/29/21 0124   ipratropium-albuterol (DUONEB) 0.5-2.5 (3) MG/3ML nebulizer solution 3 mL  3 mL Nebulization Q6H PRN Agbata, Tochukwu, MD       LORazepam (ATIVAN) injection 2 mg  2 mg Intravenous Q2H PRN Agbata, Tochukwu, MD   2 mg  at 08/28/21 1710   losartan (COZAAR) tablet 50 mg  50 mg Oral Daily Arnetha Courser, MD   50 mg at 08/31/21 0912   multivitamin with minerals tablet 1 tablet  1 tablet Oral Daily Arnetha Courser, MD   1 tablet at 08/31/21 4166   nicotine (NICODERM CQ - dosed in mg/24 hours) patch 21 mg  21 mg Transdermal Daily Delfino Lovett, MD   21 mg at 08/31/21 0914   ondansetron (ZOFRAN) tablet 4 mg  4 mg Oral Q6H PRN Agbata, Tochukwu, MD       Or   ondansetron (ZOFRAN) injection 4 mg  4 mg Intravenous Q6H PRN Agbata, Tochukwu, MD       Oral care mouth rinse  15 mL Mouth Rinse PRN Delfino Lovett, MD         Discharge Medications: Please see discharge summary for a list of discharge medications.  Relevant Imaging Results:  Relevant Lab Results:   Additional Information SSN [id number]  Caryn Section, RN

## 2021-08-31 NOTE — Assessment & Plan Note (Signed)
Improving, CK at 813 today Continue aggressive IV fluid hydration.  He has had multiple falls which could be the etiology Hold statins CK levels started trending down. -Continue with IV hydration and monitor

## 2021-08-31 NOTE — Assessment & Plan Note (Signed)
Estimated body mass index is 18.36 kg/m as calculated from the following:   Height as of this encounter: 6' (1.829 m).   Weight as of this encounter: 61.4 kg.  Complicates overall prognosis.  Per his sister he has had about 30 to 40 pound weight loss since January of this year.  She reports significant clinical and functional decline over last six 1 to 1-year.  -Dietitian consult -Patient refusing most of the p.o. intake 

## 2021-08-31 NOTE — TOC Progression Note (Signed)
Transition of Care (TOC) - Progression Note    Patient Details  Name: Zakariah Urwin. MRN: [id number] Date of Birth: Jun 21, 1960  [facility]) CM/SW Contact  Caryn Section, RN Phone Number: 08/31/2021, 2:16 PM  Clinical Narrative:   RNCM spoke with patient's sister Jessy Oto Sister     [phone]  And after speaking with palliative, family would like to have a hospice consult for patient, when he will be evaluated either for inpatient hospice or discharge to facility with outpatient hospice.  RNCM provided a choice of hospice providers to sister.  Sister states they are currently using [facility] for their father and would like the same provider for the patient.  Watt Climes from [facility] consulted, plans to see patient in the AM  [facility] to follow     Expected Discharge Plan: Group Home Barriers to Discharge: Continued Medical Work up  Expected Discharge Plan and Services Expected Discharge Plan: Group Home   Discharge Planning Services: CM Consult   Living arrangements for the past 2 months: Group Home                                       Social Determinants of Health (SDOH) Interventions    Readmission Risk Interventions     No data to display

## 2021-08-31 NOTE — Progress Notes (Signed)
Progress Note   Patient: Christopher Valdez. PHX:[id number] DOB: Aug 29, 1960 DOA: 08/26/2021     5 DOS: the patient was seen and examined on 08/31/2021   Brief hospital course: 61 y.o. male with medical history significant for schizoaffective disorder, GERD, nephrolithiasis who was sent over from group home for evaluation of generalized weakness, altered mental status and multiple falls.  8/17: Psychiatry, ID consult, stepdown status 8/18: Neurology consult, EEG showed no seizure, moving out to floors 8/19: Patient continues to remain altered and further work-up per neurology pending.  CK levels continue to remain elevated and patient remains on IV fluid.  8/20: MRI brain was negative for any acute abnormality.  CK level started trending down, at 4614 today.  Paraneoplastic and autoimmune labs are still pending.  RPR pending. Losartan 25 mg was added for persistently elevated blood pressure. 8/21: Patient with food pocketing in his mouth requiring a lot of reminders to swallow.  Drinking boost without any problem. Diet can be switched to full liquid. Blood pressure remained elevated-losartan increased to 50 mg daily.  Potassium at 3.3 which is being repleted. CK continues to trend down, at 2347 today. RPR negative ParaNeoplastic panel pending. EEG done today which shows moderate diffuse encephalopathy.  No seizures or epileptiform changes were seen. PT/OT recommending SNF.  8/22: CK continued to improve, at 813 today.  Paraneoplastic panel still pending.  Concern of Parkinson's plus syndrome and dementia based on neurologic exam per neurology note. Per neurology differential can be a long-term side effect of neuroleptics, he was on clozapine and also takes Ingrezza. Please see neurology note for further detail. Patient refusing most of p.o. intake. Palliative met with sister and family is leaning more towards focus on comfort, TOC is looking whether to discharge to facility with hospice versus  hospice facility if he qualifies. IV fluid rate decreased to 100.  We can discontinue IV fluid if his start taking p.o. or family makes him complete comfort care.   Assessment and Plan: * Acute metabolic encephalopathy Likely multifactorial.  Discussed with ID, neurology and reviewed psychiatry input EEG did not show any seizure activity Unlikely neuroleptic malignant or serotonin syndrome CSF positive for HHV-6 but per discussion with ID likely not significant RPR negative. MRI brain was negative for any acute abnormality. Cannot rule out paraneoplastic syndrome and or autoimmune encephalitis considering significant weight loss of 30-40 pounds since January -Follow-up  paraneoplastic labs-still pending    Rhabdomyolysis Improving, CK at 813 today Continue aggressive IV fluid hydration.  He has had multiple falls which could be the etiology Hold statins CK levels started trending down. -Continue with IV hydration and monitor  Essential hypertension Blood pressure remained elevated. -Continue losartan  50 mg daily -We will add another medicine if remain elevated next couple of days.  Protein-calorie malnutrition, severe ([place]) Estimated body mass index is 18.36 kg/m as calculated from the following:   Height as of this encounter: 6' (1.829 m).   Weight as of this encounter: 61.4 kg.  Complicates overall prognosis.  Per his sister he has had about 30 to 40 pound weight loss since January of this year.  She reports significant clinical and functional decline over last six 1 to 1-year.  -Dietitian consult -Patient refusing most of the p.o. intake  Other schizoaffective disorders ([place]) Hold all antipsychotic agents for now.  Seen by psychiatry.  Currently on as needed Ativan  Hypernatremia Resolved with IV hydration. -Continue to monitor  Tobacco use Counseled.  Nicotine patch ordered  Hypokalemia Potassium at 2.9 with magnesium of 1.7. -Replete electrolytes and  monitor  COPD (chronic obstructive pulmonary disease) (HCC) Not acutely exacerbated  as needed bronchodilator therapy.     Subjective: Patient was seen and examined today.  He was keeps repeating same words but able to answer questions about self and place.  Appears to have significant cognitive impairment.  Physical Exam: Vitals:   08/30/21 1617 08/30/21 2003 08/31/21 0429 08/31/21 1110  BP: (!) 182/76 (!) 153/80 135/71 (!) 167/82  Pulse: 68 77 71 61  Resp: $Remo'19 17 17 'HNKBe$ (!) 22  Temp: 98.8 F (37.1 C) 97.6 F (36.4 C) 98.4 F (36.9 C) 98 F (36.7 C)  TempSrc:      SpO2: 100% 98% 98% 96%  Weight:      Height:       General.  Chronically ill-appearing, cognitively impaired gentleman, in no acute distress. Pulmonary.  Lungs clear bilaterally, normal respiratory effort. CV.  Regular rate and rhythm, no JVD, rub or murmur. Abdomen.  Soft, nontender, nondistended, BS positive. CNS.  Alert and oriented to self and place.  No focal neurologic deficit. Extremities.  No edema, no cyanosis, pulses intact and symmetrical. Psychiatry.  Judgment and insight appears impaired.  Data Reviewed: Prior data reviewed  Family Communication: Sister at bedside  Disposition: Status is: Inpatient Remains inpatient appropriate because: Severity of illness   Planned Discharge Destination: To be determined, skilled nursing facility with hospice versus hospice facility  DVT prophylaxis.  Lovenox Time spent: 45 minutes  This record has been created using Systems analyst. Errors have been sought and corrected,but may not always be located. Such creation errors do not reflect on the standard of care.  Author: Lorella Nimrod, MD 08/31/2021 2:34 PM  For on call review [email or web address].

## 2021-08-31 NOTE — Assessment & Plan Note (Signed)
Blood pressure remained elevated. -Continue losartan  50 mg daily -We will add another medicine if remain elevated next couple of days.

## 2021-08-31 NOTE — Assessment & Plan Note (Signed)
Likely multifactorial.  Discussed with ID, neurology and reviewed psychiatry input EEG did not show any seizure activity Unlikely neuroleptic malignant or serotonin syndrome CSF positive for HHV-6 but per discussion with ID likely not significant RPR negative. MRI brain was negative for any acute abnormality. Cannot rule out paraneoplastic syndrome and or autoimmune encephalitis considering significant weight loss of 30-40 pounds since January -Follow-up  paraneoplastic labs-still pending

## 2021-08-31 NOTE — Care Management (Signed)
Date:  To Whom It May Concern:  Please be advised that the above-named patient will require a short-term nursing home stay - anticipated 30 days or less for rehabilitation and strengthening. The plan is for return home.

## 2021-08-31 NOTE — TOC Progression Note (Signed)
Transition of Care (TOC) - Progression Note    Patient Details  Name: Christopher Valdez. MRN: [id number] Date of Birth: April 10, 1960  Transition [facility]) CM/SW Contact  Caryn Section, RN Phone Number: 08/31/2021, 10:21 AM  Clinical Narrative:   RNCM spoke to [facility] [place].  [name] states that patient is unable to return to family care home due to level of care required.  She states if patient returns to previous level of functioning, the care home will plan to take him back in their facility.    RNCM spoke to sister [name], who states that they are consenting to SNF at this time.  They would like [place], [place] area for SNF placement.    Sister [name] states that she does not mind receiving phone calls about her brother but her sister, [name] has more availability for consult   [name] Sister     [phone]   RNCM started bed search in areas specified, will monitor for results.   Expected Discharge Plan: Group Home Barriers to Discharge: Continued Medical Work up  Expected Discharge Plan and Services Expected Discharge Plan: Group Home   Discharge Planning Services: CM Consult   Living arrangements for the past 2 months: Group Home                                       Social Determinants of Health (SDOH) Interventions    Readmission Risk Interventions     No data to display

## 2021-08-31 NOTE — Progress Notes (Addendum)
SLP F/U Note  Patient Details Name: Christopher Valdez. MRN: [id number] DOB: July 17, 1960   ST F/U w/ Family member and Palliative Care services:   Reason Eval/Treat Not Completed:  (education w/ Family member/Sister present; modified the diet order to meet pt's swallowing abilities. Discussed aspiration precautions and status w/ Sister and Palliative Care services.) Family is moving to comfort care status for pt and is having a Hospice consult tomorrow, per chart notes. Reviewed MD note.      Jerilynn Som, MS, CCC-SLP Speech Language Pathologist Rehab Services; [facility] [phone] (ascom) Krystelle Prashad 08/31/2021, 5:46 PM

## 2021-09-01 DIAGNOSIS — G9341 Metabolic encephalopathy: Secondary | ICD-10-CM | POA: Diagnosis not present

## 2021-09-01 DIAGNOSIS — Z515 Encounter for palliative care: Secondary | ICD-10-CM | POA: Diagnosis not present

## 2021-09-01 DIAGNOSIS — Z720 Tobacco use: Secondary | ICD-10-CM | POA: Diagnosis not present

## 2021-09-01 LAB — CBC
HCT: 36.7 % — ABNORMAL LOW (ref 39.0–52.0)
Hemoglobin: 12 g/dL — ABNORMAL LOW (ref 13.0–17.0)
MCH: 29.1 pg (ref 26.0–34.0)
MCHC: 32.7 g/dL (ref 30.0–36.0)
MCV: 89.1 fL (ref 80.0–100.0)
Platelets: 275 10*3/uL (ref 150–400)
RBC: 4.12 MIL/uL — ABNORMAL LOW (ref 4.22–5.81)
RDW: 15.5 % (ref 11.5–15.5)
WBC: 9.5 10*3/uL (ref 4.0–10.5)
nRBC: 0 % (ref 0.0–0.2)

## 2021-09-01 LAB — CULTURE, BLOOD (ROUTINE X 2)
Culture: NO GROWTH
Culture: NO GROWTH

## 2021-09-01 LAB — CK: Total CK: 322 U/L (ref 49–397)

## 2021-09-01 MED ORDER — LORAZEPAM 2 MG/ML IJ SOLN: 1.0000 mg | INTRAMUSCULAR | Status: DC | PRN

## 2021-09-01 MED ORDER — GLYCOPYRROLATE 0.2 MG/ML IJ SOLN: 0.4000 mg | INTRAMUSCULAR | Status: DC | PRN

## 2021-09-01 MED ORDER — MORPHINE SULFATE (CONCENTRATE) 10 MG/0.5ML PO SOLN: 5.0000 mg | ORAL | Status: DC | PRN

## 2021-09-01 MED ORDER — AMLODIPINE BESYLATE 5 MG PO TABS
5.0000 mg | ORAL_TABLET | Freq: Every day | ORAL | Status: DC
  Administered 2021-09-01: 5 mg via ORAL
  Filled 2021-09-01: qty 1

## 2021-09-01 MED ORDER — DEXTROSE 5 % IV SOLN
125.0000 mg | Freq: Four times a day (QID) | INTRAVENOUS | Status: DC
Start: 2021-09-02 — End: 2021-09-02
  Administered 2021-09-02: 125 mg via INTRAVENOUS
  Filled 2021-09-01 (×2): qty 1.25

## 2021-09-01 NOTE — Progress Notes (Signed)
Progress Note   Patient: Christopher Valdez. OJJ:[id number] DOB: 06-08-1960 DOA: 08/26/2021     6 DOS: the patient was seen and examined on 09/01/2021   Brief hospital course: 61 y.o. male with medical history significant for schizoaffective disorder, GERD, nephrolithiasis who was sent over from group home for evaluation of generalized weakness, altered mental status and multiple falls.  8/17: Psychiatry, ID consult, stepdown status 8/18: Neurology consult, EEG showed no seizure, moving out to floors 8/19: Patient continues to remain altered and further work-up per neurology pending.  CK levels continue to remain elevated and patient remains on IV fluid.  8/20: MRI brain was negative for any acute abnormality.  CK level started trending down, at 4614 today.  Paraneoplastic and autoimmune labs are still pending.  RPR pending. Losartan 25 mg was added for persistently elevated blood pressure. 8/21: Patient with food pocketing in his mouth requiring a lot of reminders to swallow.  Drinking boost without any problem. Diet can be switched to full liquid. Blood pressure remained elevated-losartan increased to 50 mg daily.  Potassium at 3.3 which is being repleted. CK continues to trend down, at 2347 today. RPR negative ParaNeoplastic panel pending. EEG done today which shows moderate diffuse encephalopathy.  No seizures or epileptiform changes were seen. PT/OT recommending SNF.  8/22: CK continued to improve, at 813 today.  Paraneoplastic panel still pending.  Concern of Parkinson's plus syndrome and dementia based on neurologic exam per neurology note. Per neurology differential can be a long-term side effect of neuroleptics, he was on clozapine and also takes Ingrezza. Please see neurology note for further detail. Patient refusing most of p.o. intake. Palliative met with sister and family is leaning more towards focus on comfort, TOC is looking whether to discharge to facility with hospice versus  hospice facility if he qualifies. IV fluid rate decreased to 100.  We can discontinue IV fluid if his start taking p.o. or family makes him complete comfort care.  8/23: CK normalized and CBC has been improved.  Blood pressure remains elevated, starting him on amlodipine 5 mg daily.  Family decided to proceed with more comfort focus care. Paraneoplastic labs still pending. Family wants him to go to hospice facility-got approval but no bed availability. Patient remained very somnolent and continue to refuse most of the p.o. intake.   Assessment and Plan: * Acute metabolic encephalopathy Likely multifactorial.  Discussed with ID, neurology and reviewed psychiatry input EEG did not show any seizure activity Unlikely neuroleptic malignant or serotonin syndrome CSF positive for HHV-6 but per discussion with ID likely not significant RPR negative. MRI brain was negative for any acute abnormality. Cannot rule out paraneoplastic syndrome and or autoimmune encephalitis considering significant weight loss of 30-40 pounds since January -Follow-up  paraneoplastic labs-still pending -- Family leaning towards full comfort measure as patient is refusing most of the p.o. intake.  He was coughing with food and only able to take some liquid.    Rhabdomyolysis Resolved.  CK now normal.   Essential hypertension Make blood pressure remained elevated. -Continue losartan  50 mg daily -Add amlodipine 5 mg daily   Protein-calorie malnutrition, severe (HCC) Estimated body mass index is 18.36 kg/m as calculated from the following:   Height as of this encounter: 6' (1.829 m).   Weight as of this encounter: 61.4 kg.  Complicates overall prognosis.  Per his sister he has had about 30 to 40 pound weight loss since January of this year.  She reports significant clinical  and functional decline over last six 1 to 1-year.  -Dietitian consult -Patient refusing most of the p.o. intake  Other schizoaffective  disorders ([place]) Hold all antipsychotic agents for now.  Seen by psychiatry.  Currently on as needed Ativan  Hypernatremia Resolved with IV hydration. -Continue to monitor  Tobacco use Counseled.  Nicotine patch ordered  Hypokalemia Potassium at 2.9 with magnesium of 1.7. -Replete electrolytes and monitor  COPD (chronic obstructive pulmonary disease) (HCC) Not acutely exacerbated  as needed bronchodilator therapy.     Subjective: Patient appears very lethargic and somnolent.  Open eyes when called his name momentarily and not participating with any other care.  Physical Exam: Vitals:   08/31/21 1603 08/31/21 2002 09/01/21 0435 09/01/21 0836  BP: (!) 168/61 (!) 146/59 (!) 162/81 (!) 153/62  Pulse: 62 65 65 75  Resp: (!) $RemoveB'24 19  18  'NzbkNGrK$ Temp: 97.9 F (36.6 C) 98.1 F (36.7 C) 98.3 F (36.8 C) (!) 97.5 F (36.4 C)  TempSrc:   Oral Oral  SpO2: 95% 95% 98% 96%  Weight:      Height:       General.  [place] ill-appearing, malnourished and lethargic gentleman, in no acute distress. Pulmonary.  Lungs clear bilaterally, normal respiratory effort. CV.  Regular rate and rhythm, no JVD, rub or murmur. Abdomen.  Soft, nontender, nondistended, BS positive. CNS.  Somnolent Extremities.  No edema, no cyanosis, pulses intact and symmetrical. Psychiatry.  Judgment and insight appears impaired  Data Reviewed: Prior data reviewed  Family Communication: Called sister with no response  Disposition: Status is: Inpatient Remains inpatient appropriate because: Severity of illness   Planned Discharge Destination: Hospice facility  Time spent: 45 minutes  This record has been created using Systems analyst. Errors have been sought and corrected,but may not always be located. Such creation errors do not reflect on the standard of care.  Author: Lorella Nimrod, MD 09/01/2021 1:43 PM  For on call review [email or web address].

## 2021-09-01 NOTE — Progress Notes (Addendum)
[facility] Civil engineer, contracting [facility]) Hospital Liaison Note   Received request from Transitions of Care Manager, Michelle Nasuti, to begin process for Hospice Home. After further conversation with PMT provider/Alicia P., NP, patient will  be evaluated for Hospice Home 8.23 as comfort focused care will begin 8.22.  At this time, patient is not under review for [facility] services.   Above information shared with TOC.   Please call with any questions/concerns.    Thank you for the opportunity to participate in this patient's care.   Odette Fraction, MSW [facility] Liaison  [phone]

## 2021-09-01 NOTE — Plan of Care (Signed)
  Problem: Clinical Measurements: Goal: Ability to maintain clinical measurements within normal limits will improve Outcome: Progressing   Problem: Clinical Measurements: Goal: Will remain free from infection Outcome: Progressing   Problem: Clinical Measurements: Goal: Diagnostic test results will improve Outcome: Progressing   

## 2021-09-01 NOTE — Progress Notes (Signed)
Civil engineer, contracting [facility]) Hospital Liaison Note  Received request from Transitions of Care Manager  Michelle Nasuti  for family interest in Hospice Home. Visited patient at bedside and spoke with sister/[name]  to confirm interest and explain services.  Approval for Hospice Home is determined by [facility] MD. Once [facility] MD has determined Hospice Home eligibility, [facility] will update hospital staff and family.  Please do not hesitate to call with any hospice related questions.    Thank you for the opportunity to participate in this patient's care.  Odette Fraction, MSW [facility] Liaison  [phone]

## 2021-09-01 NOTE — Assessment & Plan Note (Signed)
Resolved.  CK now normal.

## 2021-09-01 NOTE — Progress Notes (Signed)
Nutrition Follow-up  DOCUMENTATION CODES:   Underweight  INTERVENTION:   -Continue Boost Breeze po TID, each supplement provides 250 kcal and 9 grams of protein  -Continue 30 ml Prosource Plus TID, each supplement provides 100 kcals and 15 grams protein -Continue MVI with minerals daily  NUTRITION DIAGNOSIS:   Inadequate oral intake related to poor appetite as evidenced by meal completion < 25%.  Ongoing  GOAL:   Patient will meet greater than or equal to 90% of their needs  Progressing   MONITOR:   PO intake, Supplement acceptance, Diet advancement  REASON FOR ASSESSMENT:   Consult Assessment of nutrition requirement/status  ASSESSMENT:   Pt with medical history significant for schizoaffective disorder, GERD, nephrolithiasis who was brought in for evaluation of weakness, mental status changes and multiple falls  8/18- s/p BSE- dysphagia 2 diet with thin liquids 8/19- s/p BSE- downgraded to dysphagia 1 diet with thin liquids 8/22- diet downgraded to full liquids for comfort  Reviewed I/O's: +1.7 L x 24 hours and +7.7 L since admission  UOP: 3 L x 24 hours   Pt very somnolent at time of visit. He did not respond to voice or touch. No supports at bedside.   Pt currently on a full liquid diet for comfort. Meal completions 0%. Per discussion with SLP, pt has mainly been drinking liquids and has been eating very little solid foods.   Reviewed wt hx; pt has experienced a 19.2% wt loss over the past 3 months, which is significant for time frame.   Palliative care consulted for goals of care. Pt family is leaning towards hospice care or comfort care. Hospice consult is pending. Pt family does not desire a feeding tube.   Medications reviewed and include dextrose 5% in lactated ringer infusion @ 100 ml/hr.   Labs reviewed: CBGS: 149.    Diet Order:   Diet Order             Diet full liquid Room service appropriate? No; Fluid consistency: Thin  Diet effective now                    EDUCATION NEEDS:   Not appropriate for education at this time  Skin:  Skin Assessment: Reviewed RN Assessment  Last BM:  08/29/21  Height:   Ht Readings from Last 1 Encounters:  08/26/21 6' (1.829 m)    Weight:   Wt Readings from Last 1 Encounters:  08/26/21 61.4 kg    Ideal Body Weight:  80.9 kg  BMI:  Body mass index is 18.36 kg/m.  Estimated Nutritional Needs:   Kcal:  6644-0347  Protein:  120-135 grams  Fluid:  > 2 L    Levada Schilling, RD, LDN, CDCES Registered Dietitian II Certified Diabetes Care and Education Specialist Please refer to [facility] for RD and/or RD on-call/weekend/after hours pager

## 2021-09-01 NOTE — Progress Notes (Signed)
Palliative:  HPI: 61 y.o. male  with past medical history of schizoeffective disorder, COPD, GERD, nephrolithiasis admitted on 08/26/2021 with declining functional status and falls. Recent hospitalization July 2023 for aspiration pneumonia. Psychiatry, ID, and neurology have been following for work up.   I met today at Christopher Valdez's bedside but he is sleeping peacefully at this time. Spoke with RN who reports extreme difficulty for him to swallow meds even crushed in a bite of applesauce. I called and spoke with sister, Christopher Valdez. Updated her on his progress and concerns that he will not be able to continue pill medication much longer and that he is appropriate for hospice facility at this time. Christopher Valdez agrees with hospice facility transition when bed available. I let her know that he was approved and hospice liaison will be in touch about bed availability and timing of transfer. Christopher Valdez expresses understanding. Christopher Valdez is appreciative of the efforts to get his psych meds in him. I educated that hospice can ensure he has medication to provide him comfort even if he is unable to continue his psych medications. Family's main goal is that he is able to maintain his comfort without physical or psychological distress. They are appreciative of the care he is receiving.   All questions/concerns addressed. Emotional support provided.   Exam: Sleeping comfortably. Did not awaken. Thin, frail. Breathing regular, unlabored. Abd flat.   Plan: - DNR, comfort care - Transition to hospice facility when bed available - PRN medications available to ensure comfort  25 min   , NP Palliative Medicine Team Pager [phone] (Please see [email or web address] for schedule) Team Phone [phone]    Greater than 50%  of this time was spent counseling and coordinating care related to the above assessment and plan   

## 2021-09-01 NOTE — Assessment & Plan Note (Signed)
Estimated body mass index is 18.36 kg/m as calculated from the following:   Height as of this encounter: 6' (1.829 m).   Weight as of this encounter: 61.4 kg.  Complicates overall prognosis.  Per his sister he has had about 30 to 40 pound weight loss since January of this year.  She reports significant clinical and functional decline over last six 1 to 1-year.  -Dietitian consult -Patient refusing most of the p.o. intake

## 2021-09-01 NOTE — Plan of Care (Signed)

## 2021-09-01 NOTE — Care Management Important Message (Signed)
Important Message  Patient Details  Name: Christopher Valdez. MRN: [id number] Date of Birth: [birth year]   Medicare Important Message Given:  Other (see comment)   Per MD approved for Hospice Home but no bed available today. Out of respect for the patient and family no Important Message from [facility] given.   Olegario Messier A Anselma Herbel 09/01/2021, 2:52 PM

## 2021-09-01 NOTE — Assessment & Plan Note (Signed)
Make blood pressure remained elevated. -Continue losartan  50 mg daily -Add amlodipine 5 mg daily

## 2021-09-01 NOTE — Assessment & Plan Note (Signed)
Likely multifactorial.  Discussed with ID, neurology and reviewed psychiatry input EEG did not show any seizure activity Unlikely neuroleptic malignant or serotonin syndrome CSF positive for HHV-6 but per discussion with ID likely not significant RPR negative. MRI brain was negative for any acute abnormality. Cannot rule out paraneoplastic syndrome and or autoimmune encephalitis considering significant weight loss of 30-40 pounds since January -Follow-up  paraneoplastic labs-still pending -- Family leaning towards full comfort measure as patient is refusing most of the p.o. intake.  He was coughing with food and only able to take some liquid.

## 2021-09-02 DIAGNOSIS — G9341 Metabolic encephalopathy: Secondary | ICD-10-CM | POA: Diagnosis not present

## 2021-09-02 LAB — MISC LABCORP TEST (SEND OUT): Labcorp test code: 505575

## 2021-09-02 MED ORDER — CLOZAPINE 25 MG PO TABS: 25.0000 mg | ORAL_TABLET | Freq: Two times a day (BID) | ORAL | Status: AC

## 2021-09-02 MED ORDER — VALPROATE SODIUM 100 MG/ML IV SOLN: 125.0000 mg | Freq: Four times a day (QID) | INTRAVENOUS | Status: AC

## 2021-09-02 NOTE — Progress Notes (Signed)
Civil engineer, contracting [facility])  Hospice Home has a bed today for Christopher Valdez.  Family aware and agreeable to transfer today.  Necessary consents have to be completed prior to arranging transport, and his sister is in the process of completing them now.  RN staff, please call report to: [phone].   Thank you, Wallis Bamberg DNP, RN [facility] Liaison

## 2021-09-02 NOTE — Progress Notes (Signed)
Palliative:  Hospice bed available. Full comfort care. Appears comfortable today. No further palliative needs.   No charge  Yong Channel, NP Palliative Medicine Team Pager [phone] (Please see [email or web address] for schedule) Team Phone [phone]

## 2021-09-02 NOTE — Discharge Summary (Signed)
Physician Discharge Summary   Patient: Leanard Dimaio. MRN: [id number] DOB: September 03, 1960  Admit date:     08/26/2021  Discharge date: 09/02/21  Discharge Physician: Lorella Nimrod   PCP: Steele Sizer, MD   Recommendations at discharge:  Continue antiseizure medications. Patient is being discharged to hospice facility  Discharge Diagnoses: Principal Problem:   Acute metabolic encephalopathy Active Problems:   Rhabdomyolysis   Essential hypertension   Other schizoaffective disorders (HCC)   Protein-calorie malnutrition, severe (HCC)   Hypernatremia   Tobacco use   Hypokalemia   COPD (chronic obstructive pulmonary disease) [facility])   Hospital Course: 61 y.o. male with medical history significant for schizoaffective disorder, GERD, nephrolithiasis who was sent over from group home for evaluation of generalized weakness, altered mental status and multiple falls.  8/17: Psychiatry, ID consult, stepdown status 8/18: Neurology consult, EEG showed no seizure, moving out to floors 8/19: Patient continues to remain altered and further work-up per neurology pending.  CK levels continue to remain elevated and patient remains on IV fluid.  8/20: MRI brain was negative for any acute abnormality.  CK level started trending down, at 4614 today.  Paraneoplastic and autoimmune labs are still pending.  RPR pending. Losartan 25 mg was added for persistently elevated blood pressure. 8/21: Patient with food pocketing in his mouth requiring a lot of reminders to swallow.  Drinking boost without any problem. Diet can be switched to full liquid. Blood pressure remained elevated-losartan increased to 50 mg daily.  Potassium at 3.3 which is being repleted. CK continues to trend down, at 2347 today. RPR negative ParaNeoplastic panel pending. EEG done today which shows moderate diffuse encephalopathy.  No seizures or epileptiform changes were seen. PT/OT recommending SNF.  8/22: CK continued to improve, at  813 today.  Paraneoplastic panel still pending.  Concern of Parkinson's plus syndrome and dementia based on neurologic exam per neurology note. Per neurology differential can be a long-term side effect of neuroleptics, he was on clozapine and also takes Ingrezza. Please see neurology note for further detail. Patient refusing most of p.o. intake. Palliative met with sister and family is leaning more towards focus on comfort, TOC is looking whether to discharge to facility with hospice versus hospice facility if he qualifies. IV fluid rate decreased to 100.  We can discontinue IV fluid if his start taking p.o. or family makes him complete comfort care.  8/23: CK normalized and CBC has been improved.  Blood pressure remains elevated, starting him on amlodipine 5 mg daily.  Family decided to proceed with more comfort focus care. Paraneoplastic labs still pending. Family wants him to go to hospice facility-got approval but no bed availability. Patient remained very somnolent and continue to refuse most of the p.o. intake.  8/24: Patient is awake but not oriented or able to participate in care.  Not taking most of the p.o. intake.  We switched his seizure medications to IV to prevent seizures. Patient is being discharged to hospice facility for end-of-life care.    Assessment and Plan: * Acute metabolic encephalopathy Likely multifactorial.  Discussed with ID, neurology and reviewed psychiatry input EEG did not show any seizure activity Unlikely neuroleptic malignant or serotonin syndrome CSF positive for HHV-6 but per discussion with ID likely not significant RPR negative. MRI brain was negative for any acute abnormality. Cannot rule out paraneoplastic syndrome and or autoimmune encephalitis considering significant weight loss of 30-40 pounds since January -Follow-up  paraneoplastic labs-still pending -- Family leaning towards full comfort  measure as patient is refusing most of the p.o. intake.   He was coughing with food and only able to take some liquid.    Rhabdomyolysis Resolved.  CK now normal.   Essential hypertension Make blood pressure remained elevated. -Continue losartan  50 mg daily -Add amlodipine 5 mg daily   Protein-calorie malnutrition, severe (HCC) Estimated body mass index is 18.36 kg/m as calculated from the following:   Height as of this encounter: 6' (1.829 m).   Weight as of this encounter: 61.4 kg.  Complicates overall prognosis.  Per his sister he has had about 30 to 40 pound weight loss since January of this year.  She reports significant clinical and functional decline over last six 1 to 1-year.  -Dietitian consult -Patient refusing most of the p.o. intake  Other schizoaffective disorders ([place]) Hold all antipsychotic agents for now.  Seen by psychiatry.  Currently on as needed Ativan  Hypernatremia Resolved with IV hydration. -Continue to monitor  Tobacco use Counseled.  Nicotine patch ordered  Hypokalemia Potassium at 2.9 with magnesium of 1.7. -Replete electrolytes and monitor  COPD (chronic obstructive pulmonary disease) (HCC) Not acutely exacerbated  as needed bronchodilator therapy.         Consultants: Neurology, palliative care Procedures performed: None Disposition: Hospice care Diet recommendation:  Discharge Diet Orders (From admission, onward)     Start     Ordered   09/02/21 0000  Diet - low sodium heart healthy        09/02/21 1019           Dysphagia type 1 thin Liquid DISCHARGE MEDICATION: Allergies as of 09/02/2021       Reactions   Tall Ragweed Other (See Comments)   Pollen        Medication List     STOP taking these medications    divalproex 500 MG 24 hr tablet Commonly known as: DEPAKOTE ER   polycarbophil 625 MG tablet Commonly known as: FIBERCON   simvastatin 20 MG tablet Commonly known as: ZOCOR       TAKE these medications    acetaminophen 500 MG tablet Commonly known  as: TYLENOL Take 1,000 mg by mouth every 4 (four) hours as needed for mild pain or headache.   albuterol 108 (90 Base) MCG/ACT inhaler Commonly known as: VENTOLIN HFA Inhale 2 puffs into the lungs every 6 (six) hours as needed for wheezing or shortness of breath.   cloZAPine 25 MG tablet Commonly known as: CLOZARIL Take 1 tablet (25 mg total) by mouth 2 (two) times daily. What changed:  medication strength how much to take   FLUoxetine 40 MG capsule Commonly known as: PROZAC Take 40 mg by mouth daily.   Ingrezza 80 MG capsule Generic drug: valbenazine Take 80 mg by mouth daily.   omeprazole 40 MG capsule Commonly known as: PRILOSEC TAKE 1 CAPSULE BY MOUTH DAILY What changed:  how much to take how to take this when to take this additional instructions   umeclidinium-vilanterol 62.5-25 MCG/ACT Aepb Commonly known as: ANORO ELLIPTA Inhale 1 puff into the lungs daily at 6 (six) AM.   valproate 125 mg in dextrose 5 % 50 mL Inject 125 mg into the vein every 6 (six) hours.   Vitamin D3 50 MCG (2000 UT) capsule TAKE 1 CAPSULE BY MOUTH DAILY        Discharge Exam: Filed Weights   08/26/21 0753 08/26/21 1435  Weight: 66 kg 61.4 kg   General.  Chronically ill-appearing gentleman,  in no acute distress. Pulmonary.  Lungs clear bilaterally, normal respiratory effort. CV.  Regular rate and rhythm, no JVD, rub or murmur. Abdomen.  Soft, nontender, nondistended, BS positive. CNS.  Alert but not oriented. Extremities.  No edema, no cyanosis, pulses intact and symmetrical. Psychiatry.  Judgment and insight appears impaired.  Condition at discharge: stable  The results of significant diagnostics from this hospitalization (including imaging, microbiology, ancillary and laboratory) are listed below for reference.   Imaging Studies: EEG adult  Result Date: 09-01-2021 Lora Havens, MD     09/01/21 12:43 PM Patient Name: Louann Liv. MRN: [id number] Epilepsy  Attending: Lora Havens Referring Physician/Provider: Derek Jack, MD Date: 2021/09/01 Duration: 25.52 mins Patient history:  61 yo man with hx schizoaffective disorder admitted from group home for generalized weakness, worsening encephalopathy, and multiple falls. EEG to evaluate for seizure Level of alertness: Awake AEDs during EEG study: None Technical aspects: This EEG study was done with scalp electrodes positioned according to the 10-20 International system of electrode placement. Electrical activity was reviewed with band pass filter of 1-_0 , sensitivity of 7 uV/mm, display speed of 82m/sec with a _1  notched filter applied as appropriate. EEG data were recorded continuously and digitally stored.  Video monitoring was available and reviewed as appropriate. Description: No clear posterior dominant rhythm was seen.  EEG showed continuous generalized predominantly 5 to 7 Hz theta slowing admixed with intermittent generalized 2 to 3 Hz delta slowing. Physiologic photic driving was not seen during photic stimulation. Hyperventilation was not performed.   ABNORMALITY - Continuous slow, generalized IMPRESSION: This study is suggestive of moderate diffuse encephalopathy, nonspecific etiology. No seizures or epileptiform discharges were seen throughout the recording. PLora Havens  MR BRAIN W WO CONTRAST  Result Date: 08/28/2021 CLINICAL DATA:  Stroke suspected, cognitive decline EXAM: MRI HEAD WITHOUT AND WITH CONTRAST TECHNIQUE: Multiplanar, multiecho pulse sequences of the brain and surrounding structures were obtained without and with intravenous contrast. CONTRAST:  634mGADAVIST GADOBUTROL 1 MMOL/ML IV SOLN COMPARISON:  No prior MRI, correlation is made with 08/26/2021 CT head FINDINGS: Brain: No restricted diffusion to suggest acute or subacute infarct. No acute hemorrhage, mass, mass effect, or midline shift. No hydrocephalus or extra-axial collection. No hemosiderin deposition to suggest  remote hemorrhage. No abnormal parenchymal or meningeal enhancement, although the postcontrast sequences are somewhat motion limited. Vascular: Normal arterial and venous enhancement. Skull and upper cervical spine: Normal marrow signal. Sinuses/Orbits: Mucous retention cyst in the left maxillary sinus. The orbits are unremarkable. Other: The mastoids are well aerated. IMPRESSION: No acute intracranial process. No evidence of acute or subacute infarct. The postcontrast sequences are somewhat motion limited; within this limitation, no abnormal enhancement is seen. Electronically Signed   By: AlMerilyn Baba.D.   On: 08/28/2021 04:10   EEG adult  Result Date: 08/27/2021 StDerek JackMD     08/27/2021  1:40 PM Routine EEG Report JoHulet Ehrmannis a [age].o. male with a history of encephalopathy who is undergoing an EEG to evaluate for seizures. Report: This EEG was acquired with electrodes placed according to the International 10-20 electrode system (including Fp1, Fp2, F3, F4, C3, C4, P3, P4, O1, O2, T3, T4, T5, T6, A1, A2, Fz, Cz, Pz). The following electrodes were missing or displaced: none. The occipital dominant rhythm was 6 Hz with overriding beta frequencies. This activity is reactive to stimulation. Drowsiness was manifested by background fragmentation; deeper stages of sleep were identified  by K complexes and sleep spindles. There was no focal slowing. There were no interictal epileptiform discharges. There were no electrographic seizures identified. Photic stimulation and hyperventilation were not performed. Impression and clinical correlation: This EEG was obtained while awake and asleep and is abnormal due to moderate diffuse slowing indicative of global cerebral dysfunction. Epileptiform abnormalities were not seen during this recording. Su Monks, MD [facility] [phone] If 7pm- 7am, please page neurology on call as listed in [place].   CT HEAD WO CONTRAST (5MM)  Result  Date: 08/26/2021 CLINICAL DATA:  Altered mental status. EXAM: CT HEAD WITHOUT CONTRAST TECHNIQUE: Contiguous axial images were obtained from the base of the skull through the vertex without intravenous contrast. RADIATION DOSE REDUCTION: This exam was performed according to the departmental dose-optimization program which includes automated exposure control, adjustment of the mA and/or kV according to patient size and/or use of iterative reconstruction technique. COMPARISON:  None Available. FINDINGS: Brain: No evidence of acute infarction, hemorrhage, hydrocephalus, extra-axial collection or mass lesion/mass effect. Vascular: No hyperdense vessel or unexpected calcification. Skull: Normal. Negative for fracture or focal lesion. Sinuses/Orbits: No acute finding. Other: None. IMPRESSION: No acute intracranial abnormality seen. Electronically Signed   By: Marijo Conception M.D.   On: 08/26/2021 08:37   DG Chest Portable 1 View  Result Date: 08/26/2021 CLINICAL DATA:  SOB per ordering notes. Patient unable to give adequate history at this time- per RN notes- Pt here via [facility] from [place] with weakness and 3 unwitnessed fall today. Pt verbal at times, will not respond to questions about pain sob EXAM: PORTABLE CHEST 1 VIEW COMPARISON:  None Available. FINDINGS: Normal mediastinum and cardiac silhouette. Normal pulmonary vasculature. No evidence of effusion, infiltrate, or pneumothorax. No acute bony abnormality. Healed anterior LEFT rib fractures. IMPRESSION: No acute cardiopulmonary process. Electronically Signed   By: Suzy Bouchard M.D.   On: 08/26/2021 08:16    Microbiology: Results for orders placed or performed during the hospital encounter of 08/26/21  Blood culture (routine x 2)     Status: None   Collection Time: 08/26/21  9:31 AM   Specimen: BLOOD  Result Value Ref Range Status   Specimen Description BLOOD LEFT FOREARM  Final   Special Requests   Final    BOTTLES DRAWN AEROBIC AND  ANAEROBIC Blood Culture results may not be optimal due to an excessive volume of blood received in culture bottles   Culture   Final    NO GROWTH 6 DAYS Performed at [facility], [address], [place], [place] [address]    Report Status 09/01/2021 FINAL  Final  Blood culture (routine x 2)     Status: None   Collection Time: 08/26/21  9:31 AM   Specimen: BLOOD  Result Value Ref Range Status   Specimen Description BLOOD RIGHT AC  Final   Special Requests   Final    BOTTLES DRAWN AEROBIC AND ANAEROBIC Blood Culture results may not be optimal due to an excessive volume of blood received in culture bottles   Culture   Final    NO GROWTH 6 DAYS Performed at [facility], [address] [place], [place] [address]    Report Status 09/01/2021 FINAL  Final  CSF culture w Gram Stain     Status: None   Collection Time: 08/26/21  9:31 AM   Specimen: CSF; Cerebrospinal Fluid  Result Value Ref Range Status   Specimen Description   Final    CSF Performed  at [facility], [address] [place], [place] [address]    Special Requests   Final    NONE Performed at [facility], [address] [place], [place] [address]    Gram Stain   Final    NO ORGANISMS SEEN RED BLOOD CELLS WBC SEEN Performed at [facility], [address], [place], [place] [address]    Culture   Final    NO GROWTH 3 DAYS Performed at [facility], [place] [address], [place], [place] [address]    Report Status 08/29/2021 FINAL  Final  MRSA Next Gen by PCR, Nasal     Status: None   Collection Time: 08/26/21  3:29 PM   Specimen: Nasal Swab  Result Value Ref Range Status   MRSA by PCR Next Gen NOT DETECTED NOT DETECTED Final    Comment: (NOTE) The GeneXpert MRSA Assay (FDA approved for NASAL specimens only), is one component of a comprehensive MRSA colonization surveillance program. It is not intended to diagnose MRSA infection nor to guide or monitor treatment  for MRSA infections. Test performance is not FDA approved in patients less than 32 years old. Performed at [facility], [address] [place],  [address]     Labs: CBC: Recent Labs  Lab 08/27/21 0421 08/28/21 0630 08/29/21 0611 09/01/21 0531  WBC 11.5* 10.9* 9.5 9.5  HGB 10.7* 11.5* 11.7* 12.0*  HCT 33.6* 35.2* 35.4* 36.7*  MCV 88.9 86.7 88.1 89.1  PLT 225 245 211 151   Basic Metabolic Panel: Recent Labs  Lab 08/27/21 0421 08/28/21 0630 08/29/21 0611 08/30/21 0632 08/31/21 0624  NA 147* 140 144 142 141  K 3.3* 3.1* 2.9* 3.3* 3.5  CL 116* 109 113* 111 108  CO2 _0 GLUCOSE 140* 120* 117* 119* 112*  BUN 36* 20 9 7* 8  CREATININE 0.89 0.66 0.63 0.56* 0.70  CALCIUM 9.0 8.9 8.9 9.0 9.0  MG  --  1.7 1.7 2.1  --    Liver Function Tests: No results for input(s): "AST", "ALT", "ALKPHOS", "BILITOT", "PROT", "ALBUMIN" in the last 168 hours. CBG: Recent Labs  Lab 08/26/21 1456 08/28/21 1706  GLUCAP 110* 149*    Discharge time spent: greater than 30 minutes.  This record has been created using Systems analyst. Errors have been sought and corrected,but may not always be located. Such creation errors do not reflect on the standard of care.   Signed: Lorella Nimrod, MD [facility] 09/02/2021

## 2021-09-08 LAB — MISC LABCORP TEST (SEND OUT): Labcorp test code: 9985

## 2021-09-09 ENCOUNTER — Other Ambulatory Visit: Payer: Self-pay | Admitting: Infectious Diseases

## 2021-09-09 DIAGNOSIS — G9340 Encephalopathy, unspecified: Secondary | ICD-10-CM

## 2021-09-10 LAB — MISCELLANEOUS TEST

## 2021-09-10 LAB — MISC LABCORP TEST (SEND OUT): Labcorp test code: 60071

## 2021-09-14 ENCOUNTER — Encounter: Payer: Medicare Other | Admitting: Family Medicine

## 2021-10-10 DEATH — deceased

## 2021-10-11 ENCOUNTER — Ambulatory Visit: Payer: Self-pay | Admitting: Psychiatry

## 2021-10-19 ENCOUNTER — Ambulatory Visit: Payer: Medicare Other | Admitting: Psychiatry

## 2021-10-27 ENCOUNTER — Ambulatory Visit: Payer: Medicare Other | Admitting: Gastroenterology
